# Patient Record
Sex: Male | Born: 1955 | Race: Black or African American | Hispanic: No | Marital: Single | Smoking: Never smoker
Health system: Southern US, Community
[De-identification: ages and names within clinical notes are randomized; demographics above are authoritative.]

## PROBLEM LIST (undated history)

## (undated) DIAGNOSIS — F1010 Alcohol abuse, uncomplicated: Secondary | ICD-10-CM

## (undated) DIAGNOSIS — I5032 Chronic diastolic (congestive) heart failure: Secondary | ICD-10-CM

## (undated) DIAGNOSIS — I10 Essential (primary) hypertension: Secondary | ICD-10-CM

## (undated) DIAGNOSIS — I4892 Unspecified atrial flutter: Secondary | ICD-10-CM

## (undated) HISTORY — DX: Unspecified atrial flutter: I48.92

## (undated) HISTORY — PX: NO PAST SURGERIES: SHX2092

## (undated) HISTORY — DX: Chronic diastolic (congestive) heart failure: I50.32

---

## 2008-12-30 ENCOUNTER — Inpatient Hospital Stay: Payer: Self-pay | Admitting: Internal Medicine

## 2010-10-22 ENCOUNTER — Emergency Department: Payer: Self-pay | Admitting: Emergency Medicine

## 2013-08-17 ENCOUNTER — Emergency Department: Payer: Self-pay | Admitting: Internal Medicine

## 2013-08-25 ENCOUNTER — Emergency Department: Payer: Self-pay | Admitting: Emergency Medicine

## 2018-06-20 ENCOUNTER — Encounter: Payer: Self-pay | Admitting: Emergency Medicine

## 2018-06-20 ENCOUNTER — Emergency Department: Payer: Self-pay

## 2018-06-20 ENCOUNTER — Other Ambulatory Visit: Payer: Self-pay

## 2018-06-20 ENCOUNTER — Inpatient Hospital Stay
Admission: EM | Admit: 2018-06-20 | Discharge: 2018-06-22 | DRG: 309 | Disposition: A | Discharge status: Home / Self Care | Payer: Self-pay | Attending: Internal Medicine | Admitting: Internal Medicine

## 2018-06-20 DIAGNOSIS — R000 Tachycardia, unspecified: Secondary | ICD-10-CM

## 2018-06-20 DIAGNOSIS — I4892 Unspecified atrial flutter: Principal | ICD-10-CM | POA: Diagnosis present

## 2018-06-20 DIAGNOSIS — I471 Supraventricular tachycardia: Secondary | ICD-10-CM | POA: Diagnosis present

## 2018-06-20 DIAGNOSIS — G629 Polyneuropathy, unspecified: Secondary | ICD-10-CM | POA: Diagnosis present

## 2018-06-20 DIAGNOSIS — F17200 Nicotine dependence, unspecified, uncomplicated: Secondary | ICD-10-CM | POA: Diagnosis present

## 2018-06-20 DIAGNOSIS — N179 Acute kidney failure, unspecified: Secondary | ICD-10-CM | POA: Diagnosis present

## 2018-06-20 DIAGNOSIS — Z23 Encounter for immunization: Secondary | ICD-10-CM

## 2018-06-20 DIAGNOSIS — R200 Anesthesia of skin: Secondary | ICD-10-CM

## 2018-06-20 DIAGNOSIS — R780 Finding of alcohol in blood: Secondary | ICD-10-CM

## 2018-06-20 DIAGNOSIS — N189 Chronic kidney disease, unspecified: Secondary | ICD-10-CM | POA: Diagnosis present

## 2018-06-20 DIAGNOSIS — I480 Paroxysmal atrial fibrillation: Secondary | ICD-10-CM | POA: Diagnosis present

## 2018-06-20 DIAGNOSIS — F10229 Alcohol dependence with intoxication, unspecified: Secondary | ICD-10-CM | POA: Diagnosis present

## 2018-06-20 DIAGNOSIS — F10239 Alcohol dependence with withdrawal, unspecified: Secondary | ICD-10-CM | POA: Diagnosis present

## 2018-06-20 DIAGNOSIS — I129 Hypertensive chronic kidney disease with stage 1 through stage 4 chronic kidney disease, or unspecified chronic kidney disease: Secondary | ICD-10-CM | POA: Diagnosis present

## 2018-06-20 DIAGNOSIS — I484 Atypical atrial flutter: Secondary | ICD-10-CM

## 2018-06-20 DIAGNOSIS — F1910 Other psychoactive substance abuse, uncomplicated: Secondary | ICD-10-CM

## 2018-06-20 DIAGNOSIS — F10231 Alcohol dependence with withdrawal delirium: Secondary | ICD-10-CM

## 2018-06-20 HISTORY — DX: Essential (primary) hypertension: I10

## 2018-06-20 LAB — GLUCOSE, CAPILLARY: Glucose-Capillary: 98 mg/dL (ref 70–99)

## 2018-06-20 LAB — CBC WITH DIFFERENTIAL/PLATELET
Abs Immature Granulocytes: 0.01 10*3/uL (ref 0.00–0.07)
Basophils Absolute: 0.1 10*3/uL (ref 0.0–0.1)
Basophils Relative: 1 %
Eosinophils Absolute: 0 10*3/uL (ref 0.0–0.5)
Eosinophils Relative: 1 %
HCT: 41.3 % (ref 39.0–52.0)
Hemoglobin: 14.2 g/dL (ref 13.0–17.0)
Immature Granulocytes: 0 %
Lymphocytes Relative: 34 %
Lymphs Abs: 2.1 10*3/uL (ref 0.7–4.0)
MCH: 28.9 pg (ref 26.0–34.0)
MCHC: 34.4 g/dL (ref 30.0–36.0)
MCV: 83.9 fL (ref 80.0–100.0)
Monocytes Absolute: 0.4 10*3/uL (ref 0.1–1.0)
Monocytes Relative: 7 %
Neutro Abs: 3.6 10*3/uL (ref 1.7–7.7)
Neutrophils Relative %: 57 %
Platelets: 376 10*3/uL (ref 150–400)
RBC: 4.92 MIL/uL (ref 4.22–5.81)
RDW: 13.2 % (ref 11.5–15.5)
WBC: 6.1 10*3/uL (ref 4.0–10.5)
nRBC: 0 % (ref 0.0–0.2)

## 2018-06-20 LAB — MRSA PCR SCREENING: MRSA by PCR: NEGATIVE

## 2018-06-20 LAB — URINE DRUG SCREEN, QUALITATIVE (ARMC ONLY)
Amphetamines, Ur Screen: NOT DETECTED
Barbiturates, Ur Screen: NOT DETECTED
Benzodiazepine, Ur Scrn: NOT DETECTED
Cannabinoid 50 Ng, Ur ~~LOC~~: NOT DETECTED
Cocaine Metabolite,Ur ~~LOC~~: NOT DETECTED
MDMA (Ecstasy)Ur Screen: NOT DETECTED
Methadone Scn, Ur: NOT DETECTED
Opiate, Ur Screen: NOT DETECTED
Phencyclidine (PCP) Ur S: NOT DETECTED
Tricyclic, Ur Screen: NOT DETECTED

## 2018-06-20 LAB — URINALYSIS, COMPLETE (UACMP) WITH MICROSCOPIC
Bacteria, UA: NONE SEEN
Bilirubin Urine: NEGATIVE
Glucose, UA: NEGATIVE mg/dL
Ketones, ur: NEGATIVE mg/dL
Leukocytes,Ua: NEGATIVE
Nitrite: NEGATIVE
Protein, ur: 30 mg/dL — AB
Specific Gravity, Urine: 1.003 — ABNORMAL LOW (ref 1.005–1.030)
pH: 6 (ref 5.0–8.0)

## 2018-06-20 LAB — COMPREHENSIVE METABOLIC PANEL
ALT: 27 U/L (ref 0–44)
AST: 51 U/L — ABNORMAL HIGH (ref 15–41)
Albumin: 4.5 g/dL (ref 3.5–5.0)
Alkaline Phosphatase: 78 U/L (ref 38–126)
Anion gap: 18 — ABNORMAL HIGH (ref 5–15)
BUN: 12 mg/dL (ref 8–23)
CO2: 22 mmol/L (ref 22–32)
Calcium: 8.6 mg/dL — ABNORMAL LOW (ref 8.9–10.3)
Chloride: 95 mmol/L — ABNORMAL LOW (ref 98–111)
Creatinine, Ser: 1.08 mg/dL (ref 0.61–1.24)
GFR calc Af Amer: >60 mL/min (ref >60)
GFR calc non Af Amer: >60 mL/min (ref >60)
Glucose, Bld: 125 mg/dL — ABNORMAL HIGH (ref 70–99)
Potassium: 4.2 mmol/L (ref 3.5–5.1)
Sodium: 135 mmol/L (ref 135–145)
Total Bilirubin: 0.7 mg/dL (ref 0.3–1.2)
Total Protein: 8.1 g/dL (ref 6.5–8.1)

## 2018-06-20 LAB — PROTIME-INR
INR: 1 (ref 0.8–1.2)
Prothrombin Time: 13 seconds (ref 11.4–15.2)

## 2018-06-20 LAB — MAGNESIUM: Magnesium: 2 mg/dL (ref 1.7–2.4)

## 2018-06-20 LAB — BRAIN NATRIURETIC PEPTIDE: B Natriuretic Peptide: 414 pg/mL — ABNORMAL HIGH (ref 0.0–100.0)

## 2018-06-20 LAB — TROPONIN I: Troponin I: <0.03 ng/mL (ref <0.03)

## 2018-06-20 LAB — ETHANOL: Alcohol, Ethyl (B): 376 mg/dL (ref <10)

## 2018-06-20 MED ORDER — ACETAMINOPHEN 325 MG PO TABS: 650.0000 mg | ORAL_TABLET | Freq: Four times a day (QID) | ORAL | Status: DC | PRN

## 2018-06-20 MED ORDER — LORAZEPAM 1 MG PO TABS: 1.0000 mg | ORAL_TABLET | Freq: Four times a day (QID) | ORAL | Status: DC | PRN

## 2018-06-20 MED ORDER — METOPROLOL TARTRATE 5 MG/5ML IV SOLN
2.5000 mg | Freq: Four times a day (QID) | INTRAVENOUS | Status: DC | PRN
  Administered 2018-06-20: 5 mg via INTRAVENOUS
  Filled 2018-06-20: qty 5

## 2018-06-20 MED ORDER — ENOXAPARIN SODIUM 40 MG/0.4ML ~~LOC~~ SOLN
40.0000 mg | SUBCUTANEOUS | Status: DC
  Administered 2018-06-20: 40 mg via SUBCUTANEOUS
  Filled 2018-06-20: qty 0.4

## 2018-06-20 MED ORDER — MAGNESIUM SULFATE 2 GM/50ML IV SOLN: 2.0000 g | Freq: Once | INTRAVENOUS | Status: DC

## 2018-06-20 MED ORDER — SODIUM CHLORIDE 0.9 % IV BOLUS
1000.0000 mL | Freq: Once | INTRAVENOUS | Status: AC
  Administered 2018-06-20: 1000 mL via INTRAVENOUS

## 2018-06-20 MED ORDER — SODIUM CHLORIDE 0.9% FLUSH
3.0000 mL | Freq: Two times a day (BID) | INTRAVENOUS | Status: DC
  Administered 2018-06-20 – 2018-06-22 (×3): 3 mL via INTRAVENOUS

## 2018-06-20 MED ORDER — ONDANSETRON HCL 4 MG PO TABS: 4.0000 mg | ORAL_TABLET | Freq: Four times a day (QID) | ORAL | Status: DC | PRN

## 2018-06-20 MED ORDER — POLYETHYLENE GLYCOL 3350 17 G PO PACK: 17.0000 g | PACK | Freq: Every day | ORAL | Status: DC | PRN

## 2018-06-20 MED ORDER — DILTIAZEM HCL 25 MG/5ML IV SOLN
15.0000 mg | Freq: Once | INTRAVENOUS | Status: AC
  Administered 2018-06-20: 15 mg via INTRAVENOUS

## 2018-06-20 MED ORDER — ACETAMINOPHEN 650 MG RE SUPP: 650.0000 mg | Freq: Four times a day (QID) | RECTAL | Status: DC | PRN

## 2018-06-20 MED ORDER — PNEUMOCOCCAL VAC POLYVALENT 25 MCG/0.5ML IJ INJ: 0.5000 mL | INJECTION | INTRAMUSCULAR | Status: DC

## 2018-06-20 MED ORDER — ASPIRIN EC 325 MG PO TBEC
325.0000 mg | DELAYED_RELEASE_TABLET | Freq: Every day | ORAL | Status: DC
  Administered 2018-06-21: 325 mg via ORAL
  Filled 2018-06-20: qty 1

## 2018-06-20 MED ORDER — FOLIC ACID 1 MG PO TABS
1.0000 mg | ORAL_TABLET | Freq: Every day | ORAL | Status: DC
  Administered 2018-06-21 – 2018-06-22 (×2): 1 mg via ORAL
  Filled 2018-06-20 (×2): qty 1

## 2018-06-20 MED ORDER — DILTIAZEM HCL 100 MG IV SOLR
5.0000 mg/h | INTRAVENOUS | Status: DC
  Administered 2018-06-20: 5 mg/h via INTRAVENOUS
  Administered 2018-06-21: 12.5 mg/h via INTRAVENOUS
  Administered 2018-06-21: 5 mg/h via INTRAVENOUS
  Administered 2018-06-21: 01:00:00 15 mg/h via INTRAVENOUS
  Filled 2018-06-20 (×3): qty 100

## 2018-06-20 MED ORDER — THIAMINE HCL 100 MG/ML IJ SOLN
100.0000 mg | Freq: Every day | INTRAMUSCULAR | Status: DC
  Filled 2018-06-20 (×2): qty 2

## 2018-06-20 MED ORDER — ADULT MULTIVITAMIN W/MINERALS CH
1.0000 | ORAL_TABLET | Freq: Every day | ORAL | Status: DC
  Administered 2018-06-21 – 2018-06-22 (×2): 1 via ORAL
  Filled 2018-06-20 (×2): qty 1

## 2018-06-20 MED ORDER — INFLUENZA VAC SPLIT QUAD 0.5 ML IM SUSY: 0.5000 mL | PREFILLED_SYRINGE | INTRAMUSCULAR | Status: DC

## 2018-06-20 MED ORDER — METOPROLOL TARTRATE 50 MG PO TABS
50.0000 mg | ORAL_TABLET | Freq: Two times a day (BID) | ORAL | Status: DC
  Administered 2018-06-21: 50 mg via ORAL
  Filled 2018-06-20: qty 1

## 2018-06-20 MED ORDER — LORAZEPAM 2 MG/ML IJ SOLN
1.0000 mg | Freq: Once | INTRAMUSCULAR | Status: AC
  Administered 2018-06-20: 1 mg via INTRAVENOUS
  Filled 2018-06-20: qty 1

## 2018-06-20 MED ORDER — ONDANSETRON HCL 4 MG/2ML IJ SOLN
4.0000 mg | Freq: Four times a day (QID) | INTRAMUSCULAR | Status: DC | PRN
  Administered 2018-06-21: 4 mg via INTRAVENOUS
  Filled 2018-06-20: qty 2

## 2018-06-20 MED ORDER — VITAMIN B-1 100 MG PO TABS
100.0000 mg | ORAL_TABLET | Freq: Every day | ORAL | Status: DC
  Administered 2018-06-21 – 2018-06-22 (×2): 100 mg via ORAL
  Filled 2018-06-20 (×2): qty 1

## 2018-06-20 MED ORDER — LACTATED RINGERS IV SOLN
INTRAVENOUS | Status: AC
  Administered 2018-06-20: 23:00:00 via INTRAVENOUS
  Administered 2018-06-21: 900 mL via INTRAVENOUS

## 2018-06-20 MED ORDER — DILTIAZEM HCL 100 MG IV SOLR: 15.0000 mg | Freq: Once | INTRAVENOUS | Status: DC

## 2018-06-20 MED ORDER — LORAZEPAM 2 MG/ML IJ SOLN
1.0000 mg | Freq: Four times a day (QID) | INTRAMUSCULAR | Status: DC | PRN
  Administered 2018-06-21: 1 mg via INTRAVENOUS
  Filled 2018-06-20 (×2): qty 1

## 2018-06-20 MED ORDER — ALBUTEROL SULFATE (2.5 MG/3ML) 0.083% IN NEBU: 2.5000 mg | INHALATION_SOLUTION | RESPIRATORY_TRACT | Status: DC | PRN

## 2018-06-20 NOTE — ED Notes (Signed)
ED TO INPATIENT HANDOFF REPORT  ED Nurse Name and Phone #: Jae Dire [phone]  S Name/Age/Gender Jacob Waters 63 y.o. male Room/Bed: ED01A/ED01A  Code Status   Code Status: Full Code  Home/SNF/Other Home Patient oriented to: self, place, time and situation Is this baseline? Yes   Triage Complete: Triage complete  Chief Complaint left side numbness  Triage Note Patient arrives via EMS for numbness on L side since Saturday. Patient also reports he has been drinking; EMS reports HR of 168 and hypertensive. Patient says he drank "5-6 beers and a pint of wine today". Denies illicit drug use   Allergies Not on File  Level of Care/Admitting Diagnosis ED Disposition    ED Disposition Condition Comment   Admit  Hospital Area: [facility] [[id number]]  Level of Care: Stepdown [14]  Diagnosis: Atrial flutter (HCC) [427.32.ICD-9-CM]  Admitting Physician: Milagros Loll [[id number]]  Attending Physician: Milagros Loll [[id number]]  Estimated length of stay: past midnight tomorrow  Certification:: I certify this patient will need inpatient services for at least 2 midnights  PT Class (Do Not Modify): Inpatient [101]  PT Acc Code (Do Not Modify): Private [1]       B Medical/Surgery History Past Medical History:  Diagnosis Date  . Hypertension    History reviewed. No pertinent surgical history.   A IV Location/Drains/Wounds Patient Lines/Drains/Airways Status   Active Line/Drains/Airways    Name:   Placement date:   Placement time:   Site:   Days:   Peripheral IV 06/20/18 Right Forearm   06/20/18    1910    Forearm   less than 1   Peripheral IV 06/20/18 Right Wrist   06/20/18    1911    Wrist   less than 1          Intake/Output Last 24 hours No intake or output data in the 24 hours ending 06/20/18 2036  Labs/Imaging Results for orders placed or performed during the hospital encounter of 06/20/18 (from the past 48 hour(s))  CBC with Differential     Status:  None   Collection Time: 06/20/18  6:25 PM  Result Value Ref Range   WBC 6.1 4.0 - 10.5 K/uL   RBC 4.92 4.22 - 5.81 MIL/uL   Hemoglobin 14.2 13.0 - 17.0 g/dL   HCT 44.9 20.1 - 00.7 %   MCV 83.9 80.0 - 100.0 fL   MCH 28.9 26.0 - 34.0 pg   MCHC 34.4 30.0 - 36.0 g/dL   RDW 12.1 97.5 - 88.3 %   Platelets 376 150 - 400 K/uL   nRBC 0.0 0.0 - 0.2 %   Neutrophils Relative % 57 %   Neutro Abs 3.6 1.7 - 7.7 K/uL   Lymphocytes Relative 34 %   Lymphs Abs 2.1 0.7 - 4.0 K/uL   Monocytes Relative 7 %   Monocytes Absolute 0.4 0.1 - 1.0 K/uL   Eosinophils Relative 1 %   Eosinophils Absolute 0.0 0.0 - 0.5 K/uL   Basophils Relative 1 %   Basophils Absolute 0.1 0.0 - 0.1 K/uL   Immature Granulocytes 0 %   Abs Immature Granulocytes 0.01 0.00 - 0.07 K/uL    Comment: Performed at [facility] [address] [place], [place] [address]  Protime-INR     Status: None   Collection Time: 06/20/18  6:25 PM  Result Value Ref Range   Prothrombin Time 13.0 11.4 - 15.2 seconds   INR 1.0 0.8 - 1.2    Comment: (NOTE)  INR goal varies based on device and disease states. Performed at [facility], [address] [place], [place] [address]   Troponin I - Once     Status: None   Collection Time: 06/20/18  6:25 PM  Result Value Ref Range   Troponin I <0.03 <0.03 ng/mL    Comment: Performed at [facility], [address] [place], [place] [address]  Brain natriuretic peptide     Status: Abnormal   Collection Time: 06/20/18  6:25 PM  Result Value Ref Range   B Natriuretic Peptide 414.0 (H) 0.0 - 100.0 pg/mL    Comment: Performed at [facility], [address] [place], [place] [address]  Comprehensive metabolic panel     Status: Abnormal   Collection Time: 06/20/18  6:25 PM  Result Value Ref Range   Sodium 135 135 - 145 mmol/L   Potassium 4.2 3.5 - 5.1 mmol/L   Chloride 95 (L) 98 - 111 mmol/L   CO2 22 22 - 32 mmol/L   Glucose, Bld 125 (H) 70 - 99 mg/dL   BUN 12 8  - 23 mg/dL   Creatinine, Ser 9.14 0.61 - 1.24 mg/dL   Calcium 8.6 (L) 8.9 - 10.3 mg/dL   Total Protein 8.1 6.5 - 8.1 g/dL   Albumin 4.5 3.5 - 5.0 g/dL   AST 51 (H) 15 - 41 U/L   ALT 27 0 - 44 U/L   Alkaline Phosphatase 78 38 - 126 U/L   Total Bilirubin 0.7 0.3 - 1.2 mg/dL   GFR calc non Af Amer >60 >60 mL/min   GFR calc Af Amer >60 >60 mL/min   Anion gap 18 (H) 5 - 15    Comment: Performed at [facility], [address] [place], [place] [address]  Ethanol     Status: Abnormal   Collection Time: 06/20/18  6:25 PM  Result Value Ref Range   Alcohol, Ethyl (B) 376 (HH) <10 mg/dL    Comment: CRITICAL RESULT CALLED TO, READ BACK BY AND VERIFIED WITH KATE BUCKNAM  06/20/18 AKT (NOTE) Lowest detectable limit for serum alcohol is 10 mg/dL. For medical purposes only. Performed at [facility], [address] [place], [place] [address]   Magnesium     Status: None   Collection Time: 06/20/18  6:25 PM  Result Value Ref Range   Magnesium 2.0 1.7 - 2.4 mg/dL    Comment: Performed at [facility], [address] [place], [place] [address]  Urinalysis, Complete w Microscopic     Status: Abnormal   Collection Time: 06/20/18  7:23 PM  Result Value Ref Range   Color, Urine STRAW (A) YELLOW   APPearance CLEAR (A) CLEAR   Specific Gravity, Urine 1.003 (L) 1.005 - 1.030   pH 6.0 5.0 - 8.0   Glucose, UA NEGATIVE NEGATIVE mg/dL   Hgb urine dipstick SMALL (A) NEGATIVE   Bilirubin Urine NEGATIVE NEGATIVE   Ketones, ur NEGATIVE NEGATIVE mg/dL   Protein, ur 30 (A) NEGATIVE mg/dL   Nitrite NEGATIVE NEGATIVE   Leukocytes,Ua NEGATIVE NEGATIVE   WBC, UA 0-5 0 - 5 WBC/hpf   Bacteria, UA NONE SEEN NONE SEEN   Squamous Epithelial / LPF 0-5 0 - 5    Comment: Performed at [facility], [address] [place], [place] [address]  Urine Drug Screen, Qualitative     Status: None   Collection Time: 06/20/18  7:23 PM  Result Value Ref Range   Tricyclic, Ur  Screen NONE DETECTED NONE DETECTED   Amphetamines, Ur Screen NONE DETECTED NONE DETECTED   MDMA (Ecstasy)Ur Screen NONE DETECTED NONE DETECTED   Cocaine Metabolite,Ur [place] NONE DETECTED NONE DETECTED   Opiate, Ur Screen NONE DETECTED NONE DETECTED   Phencyclidine (PCP) Ur S NONE DETECTED NONE DETECTED   Cannabinoid 50 Ng, Ur [place] NONE DETECTED NONE DETECTED   Barbiturates, Ur Screen NONE DETECTED NONE DETECTED   Benzodiazepine, Ur Scrn NONE DETECTED NONE DETECTED   Methadone Scn, Ur NONE DETECTED NONE DETECTED    Comment: (NOTE) Tricyclics + metabolites, urine    Cutoff 1000 ng/mL Amphetamines + metabolites, urine  Cutoff 1000 ng/mL MDMA (Ecstasy), urine              Cutoff 500 ng/mL Cocaine Metabolite, urine          Cutoff 300 ng/mL Opiate + metabolites, urine        Cutoff 300 ng/mL Phencyclidine (PCP), urine         Cutoff 25 ng/mL Cannabinoid, urine                 Cutoff 50 ng/mL Barbiturates + metabolites, urine  Cutoff 200 ng/mL Benzodiazepine, urine              Cutoff 200 ng/mL Methadone, urine                   Cutoff 300 ng/mL The urine drug screen provides only a preliminary, unconfirmed analytical test result and should not be used for non-medical purposes. Clinical consideration and professional judgment should be applied to any positive drug screen result due to possible interfering substances. A more specific alternate chemical method must be used in order to obtain a confirmed analytical result. Gas chromatography / mass spectrometry (GC/MS) is the preferred confirmat ory method. Performed at [facility], [address] [place], [place] [address]    Ct Head Wo Contrast  Result Date: 06/20/2018 CLINICAL DATA:  63 year old male with a history of numbness EXAM: CT HEAD WITHOUT CONTRAST TECHNIQUE: Contiguous axial images were obtained from the base of the skull through the vertex without intravenous contrast. COMPARISON:  None. FINDINGS: Brain: No acute  intracranial hemorrhage. No midline shift or mass effect. Brain volume loss without expansion of the ventricles. Gray-white differentiation maintained. Patchy hypodensity in the bilateral periventricular white matter. Focal hypodensity in the right basal ganglia. Vascular: Intracranial calcifications Skull: No acute skull fracture.  No aggressive bony lesions. Sinuses/Orbits: No acute finding. Other: None. IMPRESSION: Negative for acute intracranial abnormality. Brain volume loss, with evidence of chronic microvascular ischemic disease and associated intracranial atherosclerosis. Electronically Signed   By: Gilmer Mor D.O.   On: 06/20/2018 19:24   Dg Chest Port 1 View  Result Date: 06/20/2018 CLINICAL DATA:  Left-sided numbness EXAM: PORTABLE CHEST 1 VIEW COMPARISON:  None. FINDINGS: The heart size and mediastinal contours are within normal limits. Both lungs are clear. The visualized skeletal structures are unremarkable. IMPRESSION: No active disease. Electronically Signed   By: Elige Ko   On: 06/20/2018 19:24    Pending Labs Unresulted Labs (From admission, onward)    Start     Ordered   06/27/18 0500  Creatinine, serum  (enoxaparin (LOVENOX)    CrCl >/= 30 ml/min)  Weekly,   STAT    Comments:  while on enoxaparin therapy    06/20/18 2003   06/21/18 0500  Comprehensive metabolic panel  Tomorrow morning,   STAT     06/20/18 2003  06/21/18 0500  CBC  Tomorrow morning,   STAT     06/20/18 2003   06/20/18 2003  HIV antibody (Routine Testing)  Add-on,   AD     06/20/18 2003          Vitals/Pain Today's Vitals   06/20/18 1900 06/20/18 1912 06/20/18 1930 06/20/18 2000  BP: (!) 167/103  (!) 154/97 137/84  Pulse: (!) 162  (!) 160 (!) 157  Resp: 17  (!) 22 15  Temp:  98.1 F (36.7 C)    TempSrc:  Oral    SpO2: 94%  98% 93%  Weight:      Height:      PainSc:        Isolation Precautions No active isolations  Medications Medications  diltiazem (CARDIZEM) 100 mg in dextrose  5 % 100 mL (1 mg/mL) infusion (15 mg/hr Intravenous Rate/Dose Change 06/20/18 1950)  metoprolol tartrate (LOPRESSOR) tablet 50 mg (has no administration in time range)  enoxaparin (LOVENOX) injection 40 mg (has no administration in time range)  sodium chloride flush (NS) 0.9 % injection 3 mL (has no administration in time range)  acetaminophen (TYLENOL) tablet 650 mg (has no administration in time range)    Or  acetaminophen (TYLENOL) suppository 650 mg (has no administration in time range)  polyethylene glycol (MIRALAX / GLYCOLAX) packet 17 g (has no administration in time range)  ondansetron (ZOFRAN) tablet 4 mg (has no administration in time range)    Or  ondansetron (ZOFRAN) injection 4 mg (has no administration in time range)  albuterol (PROVENTIL) (2.5 MG/3ML) 0.083% nebulizer solution 2.5 mg (has no administration in time range)  lactated ringers infusion (has no administration in time range)  LORazepam (ATIVAN) tablet 1 mg (has no administration in time range)    Or  LORazepam (ATIVAN) injection 1 mg (has no administration in time range)  thiamine (VITAMIN B-1) tablet 100 mg (has no administration in time range)    Or  thiamine (B-1) injection 100 mg (has no administration in time range)  folic acid (FOLVITE) tablet 1 mg (has no administration in time range)  multivitamin with minerals tablet 1 tablet (has no administration in time range)  aspirin tablet 325 mg (has no administration in time range)  sodium chloride 0.9 % bolus 1,000 mL (0 mLs Intravenous Stopped 06/20/18 1943)  diltiazem (CARDIZEM) injection 15 mg (15 mg Intravenous Given 06/20/18 2019)  LORazepam (ATIVAN) injection 1 mg (1 mg Intravenous Given 06/20/18 2019)    Mobility walks with person assist Moderate fall risk   Focused Assessments Cardiac Assessment Handoff:  Cardiac Rhythm: Supraventricular tachycardia Lab Results  Component Value Date   TROPONINI <0.03 06/20/2018   No results found for: DDIMER Does  the Patient currently have chest pain? No     R Recommendations: See Admitting Provider Note  Report given to:   Additional Notes:

## 2018-06-20 NOTE — Progress Notes (Signed)
eLink Physician-Brief Progress Note Patient Name: Brylee Walquist DOB: April 25, 1955 MRN: [id number]   Date of Service  06/20/2018  HPI/Events of Note  12 male with hx of HTN, alcohol abuse with CC: alcohol intoxication with level 376, AMS with CTH neg. In a flutter RVR. Received lopressor and now on cardizem gtt. UDS neg. Labs co2 222. LFT normal. Troponin x 1 neg. UA neg. Data: Reviewed.  Camera: He is talking to Charity fundraiser. HR 154, flutter. 164/76, sats 96% on room air. Looks comfortable.     eICU Interventions  - discussed with NP - received fluids/CIWA protocol - 2d echo, cardiology consult in am - aspiration and seizure precautions - follow troponin.  - follow MRI for left arm numbness. Consider Neurology help if not better.  - ETOH withdrawal protocol. Prn ativan.  - on VTE sq lovenox.      Intervention Category Major Interventions: Arrhythmia - evaluation and management;Other:;Change in mental status - evaluation and management Evaluation Type: New Patient Evaluation  Ranee Gosselin 06/20/2018, 8:36 PM

## 2018-06-20 NOTE — H&P (Signed)
[facility] [place] [facility]   PATIENT NAME: Jacob Waters    MR#:  [id number]  DATE OF BIRTH:  November 30, 1955  DATE OF ADMISSION:  06/20/2018  PRIMARY CARE PHYSICIAN: Patient, No Pcp Per   REQUESTING/REFERRING PHYSICIAN: Dr. Alphonzo Lemmings  CHIEF COMPLAINT:   Chief Complaint  Patient presents with  . Numbness    HISTORY OF PRESENT ILLNESS:  Jacob Waters  is a 63 y.o. male with a known history of alcohol abuse, hypertension not on any medications at home presents to the emergency room complaining of some left arm and chest numbness.  Patient is poor historian.  His alcohol level is 340.  He is unable to tell me if he feels heaviness of his left chest and left arm or numbness.  No weakness.  He had a CT scan of the head which was normal.  Chest x-ray looks clear.  Patient was found to have atrial flutter with heart rate in the 160s.  Patient is being admitted. Patient is extremely anxious also tearful at times.  His urine drug screen is negative.  No prior history of MI or CVA.  PAST MEDICAL HISTORY:   Past Medical History:  Diagnosis Date  . Hypertension     PAST SURGICAL HISTORY:  History reviewed. No pertinent surgical history.  SOCIAL HISTORY:   Social History   Tobacco Use  . Smoking status: Current Every Day Smoker  . Smokeless tobacco: Never Used  Substance Use Topics  . Alcohol use: Yes    FAMILY HISTORY:   Patient tells me his mom and dad had no medical problems.  Unreliable due to being intoxicated DRUG ALLERGIES:  Not on File  REVIEW OF SYSTEMS:   Review of Systems  Constitutional: Positive for malaise/fatigue. Negative for chills and fever.  HENT: Negative for sore throat.   Eyes: Negative for blurred vision, double vision and pain.  Respiratory: Negative for cough, hemoptysis, shortness of breath and wheezing.   Cardiovascular: Negative for chest pain, palpitations, orthopnea and leg swelling.  Gastrointestinal: Negative for abdominal  pain, constipation, diarrhea, heartburn, nausea and vomiting.  Genitourinary: Negative for dysuria and hematuria.  Musculoskeletal: Negative for back pain and joint pain.  Skin: Negative for rash.  Neurological: Positive for sensory change. Negative for speech change, focal weakness and headaches.  Endo/Heme/Allergies: Does not bruise/bleed easily.  Psychiatric/Behavioral: Negative for depression. The patient is not nervous/anxious.     MEDICATIONS AT HOME:   Prior to Admission medications   Not on File     VITAL SIGNS:  Blood pressure (!) 154/97, pulse (!) 160, temperature 98.1 F (36.7 C), temperature source Oral, resp. rate (!) 22, height 5\' 10"  (1.778 m), weight 77.1 kg, SpO2 98 %.  PHYSICAL EXAMINATION:  Physical Exam  GENERAL:  63 y.o.-year-old patient lying in the bed with no acute distress.  EYES: Pupils equal, round, reactive to light and accommodation. No scleral icterus. Extraocular muscles intact.  HEENT: Head atraumatic, normocephalic. Oropharynx and nasopharynx clear. No oropharyngeal erythema, moist oral mucosa  NECK:  Supple, no jugular venous distention. No thyroid enlargement, no tenderness.  LUNGS: Normal breath sounds bilaterally, no wheezing, rales, rhonchi. No use of accessory muscles of respiration.  CARDIOVASCULAR: S1, S2 .  Tachycardia ABDOMEN: Soft, nontender, nondistended. Bowel sounds present. No organomegaly or mass.  EXTREMITIES: No pedal edema, cyanosis, or clubbing. + 2 pedal & radial pulses b/l.   NEUROLOGIC: Cranial nerves II through XII are intact. No focal Motor or sensory deficits appreciated b/l PSYCHIATRIC:  The patient is alert and oriented x 3.  Anxious SKIN: No obvious rash, lesion, or ulcer.   LABORATORY PANEL:   CBC Recent Labs  Lab 06/20/18 1825  WBC 6.1  HGB 14.2  HCT 41.3  PLT 376   ------------------------------------------------------------------------------------------------------------------  Chemistries  Recent Labs   Lab 06/20/18 1825  NA 135  K 4.2  CL 95*  CO2 22  GLUCOSE 125*  BUN 12  CREATININE 1.08  CALCIUM 8.6*  MG 2.0  AST 51*  ALT 27  ALKPHOS 78  BILITOT 0.7   ------------------------------------------------------------------------------------------------------------------  Cardiac Enzymes Recent Labs  Lab 06/20/18 1825  TROPONINI <0.03   ------------------------------------------------------------------------------------------------------------------  RADIOLOGY:  Ct Head Wo Contrast  Result Date: 06/20/2018 CLINICAL DATA:  63 year old male with a history of numbness EXAM: CT HEAD WITHOUT CONTRAST TECHNIQUE: Contiguous axial images were obtained from the base of the skull through the vertex without intravenous contrast. COMPARISON:  None. FINDINGS: Brain: No acute intracranial hemorrhage. No midline shift or mass effect. Brain volume loss without expansion of the ventricles. Gray-white differentiation maintained. Patchy hypodensity in the bilateral periventricular white matter. Focal hypodensity in the right basal ganglia. Vascular: Intracranial calcifications Skull: No acute skull fracture.  No aggressive bony lesions. Sinuses/Orbits: No acute finding. Other: None. IMPRESSION: Negative for acute intracranial abnormality. Brain volume loss, with evidence of chronic microvascular ischemic disease and associated intracranial atherosclerosis. Electronically Signed   By: Gilmer Mor D.O.   On: 06/20/2018 19:24   Dg Chest Port 1 View  Result Date: 06/20/2018 CLINICAL DATA:  Left-sided numbness EXAM: PORTABLE CHEST 1 VIEW COMPARISON:  None. FINDINGS: The heart size and mediastinal contours are within normal limits. Both lungs are clear. The visualized skeletal structures are unremarkable. IMPRESSION: No active disease. Electronically Signed   By: Elige Ko   On: 06/20/2018 19:24     IMPRESSION AND PLAN:   * Atrial flutter, new onset with rapid ventricular rate in the 160s.  I had  Cardizem 15 mg pushed.  Started on Cardizem drip at 15 mg/h.  Heart rate continued to be elevated in the 150s.  Patient also started on metoprolol 50 mg oral twice daily.  Admit to stepdown unit.  Critically ill.  Consulted [facility] cardiology.  Message sent to Dr. Jens Som.  Echocardiogram ordered.  Check TSH.  I have not ordered anticoagulation at this time till we can rule out stroke with an MRI of the brain.  He is on Lovenox for DVT prophylaxis.  We will start aspirin.  *Left arm and chest numbness versus heaviness.  His symptoms could be from the atrial flutter.  But with atrial flutter he is at increased risk of stroke.  We will get an MRI of the brain.  Will need further work-up if MRI shows a stroke.  At this time patient has been started on aspirin and neurochecks.  *Alcohol abuse.  High risk for withdrawals.  Patient already seems extremely anxious.  Ordered CIWA protocol.  *Hypertension.  Started on Cardizem drip and metoprolol.  Monitor.  *DVT prophylaxis with Lovenox  All the records are reviewed and case discussed with ED provider. Management plans discussed with the patient, family and they are in agreement.  CODE STATUS: Full code  TOTAL CRITICAL CARE TIME TAKING CARE OF THIS PATIENT: 80 minutes.   Orie Fisherman M.D on 06/20/2018 at 8:06 PM  Between 7am to 6pm - Pager - [phone]  After 6pm go to [email or web address] - password [email or web address]  [facility] [place] [facility]  Office  [phone]  CC: Primary care physician; Patient, No Pcp Per  Note: This dictation was prepared with Dragon dictation along with smaller phrase technology. Any transcriptional errors that result from this process are unintentional.

## 2018-06-20 NOTE — ED Triage Notes (Signed)
Patient arrives via EMS for numbness on L side since Saturday. Patient also reports he has been drinking; EMS reports HR of 168 and hypertensive. Patient says he drank "5-6 beers and a pint of wine today". Denies illicit drug use

## 2018-06-20 NOTE — ED Provider Notes (Addendum)
[facility], [facility] Emergency Department Provider Note  ____________________________________________   I have reviewed the triage vital signs and the nursing notes. Where available I have reviewed prior notes and, if possible and indicated, outside hospital notes.    HISTORY  Chief Complaint Numbness    HPI Jacob Waters is a 63 y.o. male  Resents today after admitting to drinking a considerable amount of alcohol today which he states he does every day, he states that since yesterday at an unknown time he is been having sensation of numbness but no weakness in his left upper and left lower extremities.  Is been going on on interruptedly since then.  He denies any fall or trauma.  No cocaine use he states.  No difficulty speaking or talking or walking.  He has not had this before.  Patient specifically denies any chest pain or sensation of palpitations.  However his heart rate is noted to be 160.  He states he is never had any cardiac dysrhythmia that he knows of.  Denies any other medical history, alleviating aggravating factors or prior treatment   No past medical history on file.  There are no active problems to display for this patient.     Prior to Admission medications   Not on File    Allergies Patient has no allergy information on record.  No family history on file.  Social History Social History   Tobacco Use  . Smoking status: Not on file  Substance Use Topics  . Alcohol use: Not on file  . Drug use: Not on file    Review of Systems Constitutional: No fever/chills Eyes: No visual changes. ENT: No sore throat. No stiff neck no neck pain Cardiovascular: Denies chest pain. Respiratory: Denies shortness of breath. Gastrointestinal:   no vomiting.  No diarrhea.  No constipation. Genitourinary: Negative for dysuria. Musculoskeletal: Negative lower extremity swelling Skin: Negative for rash. Neurological: Negative for severe headaches, focal  weakness or numbness.   ____________________________________________   PHYSICAL EXAM:  VITAL SIGNS: ED Triage Vitals [06/20/18 1833]  Enc Vitals Group     BP      Pulse      Resp      Temp      Temp src      SpO2 95 %     Weight      Height      Head Circumference      Peak Flow      Pain Score      Pain Loc      Pain Edu?      Excl. in GC?     Constitutional: Alert and oriented. Well appearing and in no acute distress.  Smells of alcohol Eyes: Conjunctivae are normal Head: Atraumatic HEENT: No congestion/rhinnorhea. Mucous membranes are moist.  Oropharynx non-erythematous Neck:   Nontender with no meningismus, no masses, no stridor Cardiovascular: Tachycardic, regular rhythm. Grossly normal heart sounds.  Good peripheral circulation. Respiratory: Normal respiratory effort.  No retractions. Lungs CTAB. Abdominal: Soft and nontender. No distention. No guarding no rebound Back:  There is no focal tenderness or step off.  there is no midline tenderness there are no lesions noted. there is no CVA tenderness Musculoskeletal: No lower extremity tenderness, no upper extremity tenderness. No joint effusions, no DVT signs strong distal pulses no edema Neurologic:  Normal speech and language.  Difficult to appreciate full neurologic exam given patient somewhat spotty compliance with the exam.  However, finger-to-nose is somewhat apparent on both  sides, he has good strength bilateral, upper and lower extremity there is no obvious sensory deficit that I can discern but again patient is somewhat distractible during exam.  Cranial nerves are grossly intact. Skin:  Skin is warm, dry and intact. No rash noted. Psychiatric: Mood and affect are normal. Speech and behavior are normal.  ____________________________________________   LABS (all labs ordered are listed, but only abnormal results are displayed)  Labs Reviewed  CBC WITH DIFFERENTIAL/PLATELET  PROTIME-INR  TROPONIN I  BRAIN  NATRIURETIC PEPTIDE  COMPREHENSIVE METABOLIC PANEL  ETHANOL  URINALYSIS, COMPLETE (UACMP) WITH MICROSCOPIC  URINE DRUG SCREEN, QUALITATIVE ([facility] ONLY)  MAGNESIUM  CBG MONITORING, ED    Pertinent labs  results that were available during my care of the patient were reviewed by me and considered in my medical decision making (see chart for details). ____________________________________________  EKG  I personally interpreted any EKGs ordered by me or triage There are complex tachycardia, regular rate 159 on 100 goes from 159 none 60, normal axis, no acute ischemic changes likely rate related changes noted laterally no old for comparison ____________________________________________  RADIOLOGY  Pertinent labs & imaging results that were available during my care of the patient were reviewed by me and considered in my medical decision making (see chart for details). If possible, patient and/or family made aware of any abnormal findings.  No results found. ____________________________________________    PROCEDURES  Procedure(s) performed: None  Procedures  Critical Care performed: None  ____________________________________________   INITIAL IMPRESSION / ASSESSMENT AND PLAN / ED COURSE  Pertinent labs & imaging results that were available during my care of the patient were reviewed by me and considered in my medical decision making (see chart for details).  Patient who drinks every day and states he has had a good amount of alcohol to drink today including beer and wine, presents today complaining of feeling them on his left side.  Exam is again very limited, but I do not detect any significant deficit.  He has been symptomatic he states since yesterday, making him not a candidate for TPA in any event.  Also I would suspect that there is probably some inconsistency in his history given his considerable alcohol consumption today and yesterday as he self-reports.  Nonetheless we will  get a CT scan to look for bleed than anything else given his history, and we will also have to address his heart rate.  Patient appears to my exam to be in a flutter most likely with a heart rate and narrow complex at 160 which is particularly variable.  It seems somewhat low for SVT I did try Valsalva heart rate went down to 159 and then stayed at 160 again.  I am a little reluctant to cardiovert him because of the fact that he has no idea how long he has been in this rhythm and his blood pressure is stable.  We will start with CT scan IV fluid, magnesium, blood work and we will reassess.Marland Kitchen  ----------------------------------------- 7:34 PM on 06/20/2018 -----------------------------------------  She is still asymptomatically tachycardic he is also intoxicated by his alcohol level, letter work and CT scan chest x-ray reassuring, we are starting him on a Cardizem drip to see if we can get him into a more reasonable rate.  Given his report of left-sided weakness which is certainly unreliable given his intoxication but is also most likely atrial flutter, narrow complex tachycardia which does not seem to vary in rate from 160, we will bring him  into the hospital.  ----------------------------------------- 9:17 PM on 06/20/2018 -----------------------------------------  Heart rate now 83, atrial flutter with a normal axis.  No acute ST elevation or depression,   ____________________________________________   FINAL CLINICAL IMPRESSION(S) / ED DIAGNOSES  Final diagnoses:  Rapid heart rate      This chart was dictated using voice recognition software.  Despite best efforts to proofread,  errors can occur which can change meaning.      Jeanmarie Plant, MD 06/20/18 Daneil Dan, MD 06/20/18 Elenore Paddy, MD 06/20/18 1956    Jeanmarie Plant, MD 06/20/18 2117

## 2018-06-20 NOTE — Consult Note (Signed)
Name: Jacob Waters MRN: [id number] DOB: [birth year]    ADMISSION DATE:  06/20/2018 CONSULTATION DATE: 06/20/2018  REFERRING MD : Dr. Elpidio Anis   CHIEF COMPLAINT: Left arm numbness   BRIEF PATIENT DESCRIPTION:  63 yo male with hx of HTN and alcohol abuse admitted with left arm and chest numbness, alcohol intoxication and atrial flutter with rvr requiring cardizem gtt   SIGNIFICANT EVENTS/STUDIES:  04/13-Pt admitted to stepdown unit   HISTORY OF PRESENT ILLNESS:   This is a 63 yo male with a PMH of HTN and ETOH abuse presented to [facility] ER on 04/13 with left arm and chest numbness.  In the ER CXR and CT Head negative. Lab results revealed BNP 414, troponin <0.03, UA negative, alcohol level 376, and urine drug screen negative.  EKG revealed atrial flutter hr 160's, this is new onset. Therefore, cardizem gtt initiated.  He was subsequently admitted to the stepdown unit by hospitalist team for additional workup and treatment.   PAST MEDICAL HISTORY :   has a past medical history of Hypertension.  has no past surgical history on file. Prior to Admission medications   Not on File   Not on File  FAMILY HISTORY:  family history is not on file. SOCIAL HISTORY:  reports that he has been smoking. He has never used smokeless tobacco. He reports current alcohol use. He reports that he does not use drugs.  REVIEW OF SYSTEMS: Positives in BOLD  Constitutional: Negative for fever, chills, weight loss, malaise/fatigue and diaphoresis.  HENT: Negative for hearing loss, ear pain, nosebleeds, congestion, sore throat, neck pain, tinnitus and ear discharge.   Eyes: Negative for blurred vision, double vision, photophobia, pain, discharge and redness.  Respiratory: Negative for cough, hemoptysis, sputum production, shortness of breath, wheezing and stridor.   Cardiovascular: Negative for chest pain, palpitations, orthopnea, claudication, leg swelling and PND.  Gastrointestinal: Negative for heartburn,  nausea, vomiting, abdominal pain, diarrhea, constipation, blood in stool and melena.  Genitourinary: Negative for dysuria, urgency, frequency, hematuria and flank pain.  Musculoskeletal: Negative for myalgias, back pain, joint pain and falls.  Skin: Negative for itching and rash.  Neurological: left arm and chest numbness, dizziness, tingling, tremors, sensory change, speech change, focal weakness, seizures, loss of consciousness, weakness and headaches.  Endo/Heme/Allergies: Negative for environmental allergies and polydipsia. Does not bruise/bleed easily.  SUBJECTIVE:  Pt states left arm and chest numbness improving   VITAL SIGNS: Temp:  [98.1 F (36.7 C)] 98.1 F (36.7 C) (04/13 1912) Pulse Rate:  [81-162] 157 (04/13 2000) Resp:  [15-22] 15 (04/13 2000) BP: (137-167)/(84-114) 137/84 (04/13 2000) SpO2:  [93 %-98 %] 93 % (04/13 2000) Weight:  [77.1 kg] 77.1 kg (04/13 1851)  PHYSICAL EXAMINATION: General: well developed, well nourished male NAD  Neuro: confused to placed, PERRLA, bilateral upper and lower motor strength 5/5, generalized sensation within normal limits  HEENT: supple, no JVD Cardiovascular: irregular irregular, no R/G  Lungs: clear throughout, even, non labored  Abdomen: +BS x4, soft, non tender, non distended  Musculoskeletal: normal bulk and tone, no edema  Skin: scattered scratches   Recent Labs  Lab 06/20/18 1825  NA 135  K 4.2  CL 95*  CO2 22  BUN 12  CREATININE 1.08  GLUCOSE 125*   Recent Labs  Lab 06/20/18 1825  HGB 14.2  HCT 41.3  WBC 6.1  PLT 376   Ct Head Wo Contrast  Result Date: 06/20/2018 CLINICAL DATA:  63 year old male with a history of numbness EXAM: CT HEAD  WITHOUT CONTRAST TECHNIQUE: Contiguous axial images were obtained from the base of the skull through the vertex without intravenous contrast. COMPARISON:  None. FINDINGS: Brain: No acute intracranial hemorrhage. No midline shift or mass effect. Brain volume loss without expansion  of the ventricles. Gray-white differentiation maintained. Patchy hypodensity in the bilateral periventricular white matter. Focal hypodensity in the right basal ganglia. Vascular: Intracranial calcifications Skull: No acute skull fracture.  No aggressive bony lesions. Sinuses/Orbits: No acute finding. Other: None. IMPRESSION: Negative for acute intracranial abnormality. Brain volume loss, with evidence of chronic microvascular ischemic disease and associated intracranial atherosclerosis. Electronically Signed   By: Gilmer MorJaime  Wagner D.O.   On: 06/20/2018 19:24   Dg Chest Port 1 View  Result Date: 06/20/2018 CLINICAL DATA:  Left-sided numbness EXAM: PORTABLE CHEST 1 VIEW COMPARISON:  None. FINDINGS: The heart size and mediastinal contours are within normal limits. Both lungs are clear. The visualized skeletal structures are unremarkable. IMPRESSION: No active disease. Electronically Signed   By: Elige KoHetal  Patel   On: 06/20/2018 19:24    ASSESSMENT / PLAN:  New onset atrial flutter with rvr  Supplemental O2 for dyspnea and/or hypoxia Trend troponin's Echo pending  Continue cardizem gtt  Continue scheduled and prn metoprolol  Left arm and chest numbness  MR Brain pending   ETOH withdrawal symptoms  Prn ativan per CIWA protocol Continue folic acid, mvi, and thiamine  ETOH abuse cessation counseling provided   VTE px: subq lovenox   Sonda Rumbleana , AGNP  Pulmonary/Critical Care Pager [phone] (please enter 7 digits) PCCM Consult Pager [phone] (please enter 7 digits)

## 2018-06-21 ENCOUNTER — Inpatient Hospital Stay: Payer: Self-pay

## 2018-06-21 ENCOUNTER — Inpatient Hospital Stay (HOSPITAL_COMMUNITY)
Admit: 2018-06-21 | Discharge: 2018-06-21 | Disposition: A | Discharge status: Home / Self Care | Payer: Self-pay | Attending: Internal Medicine | Admitting: Internal Medicine

## 2018-06-21 DIAGNOSIS — F1910 Other psychoactive substance abuse, uncomplicated: Secondary | ICD-10-CM

## 2018-06-21 DIAGNOSIS — R200 Anesthesia of skin: Secondary | ICD-10-CM

## 2018-06-21 DIAGNOSIS — I4892 Unspecified atrial flutter: Secondary | ICD-10-CM

## 2018-06-21 DIAGNOSIS — I483 Typical atrial flutter: Secondary | ICD-10-CM

## 2018-06-21 LAB — COMPREHENSIVE METABOLIC PANEL
ALT: 22 U/L (ref 0–44)
AST: 38 U/L (ref 15–41)
Albumin: 3.8 g/dL (ref 3.5–5.0)
Alkaline Phosphatase: 68 U/L (ref 38–126)
Anion gap: 16 — ABNORMAL HIGH (ref 5–15)
BUN: 12 mg/dL (ref 8–23)
CO2: 21 mmol/L — ABNORMAL LOW (ref 22–32)
Calcium: 8.2 mg/dL — ABNORMAL LOW (ref 8.9–10.3)
Chloride: 103 mmol/L (ref 98–111)
Creatinine, Ser: 1.05 mg/dL (ref 0.61–1.24)
GFR calc Af Amer: >60 mL/min (ref >60)
GFR calc non Af Amer: >60 mL/min (ref >60)
Glucose, Bld: 84 mg/dL (ref 70–99)
Potassium: 4.2 mmol/L (ref 3.5–5.1)
Sodium: 140 mmol/L (ref 135–145)
Total Bilirubin: 0.8 mg/dL (ref 0.3–1.2)
Total Protein: 7 g/dL (ref 6.5–8.1)

## 2018-06-21 LAB — CBC
HCT: 38.5 % — ABNORMAL LOW (ref 39.0–52.0)
Hemoglobin: 12.9 g/dL — ABNORMAL LOW (ref 13.0–17.0)
MCH: 28.7 pg (ref 26.0–34.0)
MCHC: 33.5 g/dL (ref 30.0–36.0)
MCV: 85.7 fL (ref 80.0–100.0)
Platelets: 327 10*3/uL (ref 150–400)
RBC: 4.49 MIL/uL (ref 4.22–5.81)
RDW: 13.3 % (ref 11.5–15.5)
WBC: 5.9 10*3/uL (ref 4.0–10.5)
nRBC: 0 % (ref 0.0–0.2)

## 2018-06-21 LAB — ECHOCARDIOGRAM COMPLETE
Height: 65 in
Weight: 2790.14 oz

## 2018-06-21 LAB — TSH: TSH: 0.751 u[IU]/mL (ref 0.350–4.500)

## 2018-06-21 LAB — APTT: aPTT: 29 seconds (ref 24–36)

## 2018-06-21 LAB — HEPARIN LEVEL (UNFRACTIONATED): Heparin Unfractionated: 0.47 IU/mL (ref 0.30–0.70)

## 2018-06-21 MED ORDER — HEPARIN BOLUS VIA INFUSION
4000.0000 [IU] | Freq: Once | INTRAVENOUS | Status: AC
  Administered 2018-06-21: 4000 [IU] via INTRAVENOUS
  Filled 2018-06-21: qty 4000

## 2018-06-21 MED ORDER — LORAZEPAM 1 MG PO TABS: 1.0000 mg | ORAL_TABLET | ORAL | Status: DC | PRN

## 2018-06-21 MED ORDER — HYDRALAZINE HCL 20 MG/ML IJ SOLN
10.0000 mg | Freq: Four times a day (QID) | INTRAMUSCULAR | Status: DC | PRN
  Administered 2018-06-21 – 2018-06-22 (×2): 10 mg via INTRAVENOUS
  Filled 2018-06-21 (×2): qty 1

## 2018-06-21 MED ORDER — METOPROLOL TARTRATE 50 MG PO TABS
50.0000 mg | ORAL_TABLET | Freq: Four times a day (QID) | ORAL | Status: DC
  Administered 2018-06-21 – 2018-06-22 (×2): 50 mg via ORAL
  Filled 2018-06-21 (×3): qty 1

## 2018-06-21 MED ORDER — CALCIUM CARBONATE ANTACID 500 MG PO CHEW
1.0000 | CHEWABLE_TABLET | ORAL | Status: DC | PRN
  Administered 2018-06-21: 200 mg via ORAL
  Filled 2018-06-21: qty 1

## 2018-06-21 MED ORDER — HEPARIN (PORCINE) 25000 UT/250ML-% IV SOLN
1100.0000 [IU]/h | INTRAVENOUS | Status: DC
  Administered 2018-06-21 – 2018-06-22 (×2): 1100 [IU]/h via INTRAVENOUS
  Filled 2018-06-21 (×2): qty 250

## 2018-06-21 MED ORDER — PANTOPRAZOLE SODIUM 40 MG PO TBEC
40.0000 mg | DELAYED_RELEASE_TABLET | Freq: Every day | ORAL | Status: DC
  Administered 2018-06-21 – 2018-06-22 (×2): 40 mg via ORAL
  Filled 2018-06-21 (×2): qty 1

## 2018-06-21 MED ORDER — LORAZEPAM 2 MG/ML IJ SOLN: 1.0000 mg | INTRAMUSCULAR | Status: DC | PRN

## 2018-06-21 NOTE — Progress Notes (Signed)
[facility] [place] [facility]   PATIENT NAME: Jacob Waters    MR#:  [id number]  DATE OF BIRTH:  November 14, 1955  SUBJECTIVE:  CHIEF COMPLAINT:   Chief Complaint  Patient presents with  . Numbness   His left numbness is better. No SOB or CP  Continues to be tachycardic into the 140s  Refused MRI of the brain overnight REVIEW OF SYSTEMS:    Review of Systems  Constitutional: Positive for malaise/fatigue. Negative for chills and fever.  HENT: Negative for sore throat.   Eyes: Negative for blurred vision, double vision and pain.  Respiratory: Negative for cough, hemoptysis, shortness of breath and wheezing.   Cardiovascular: Positive for palpitations. Negative for chest pain, orthopnea and leg swelling.  Gastrointestinal: Negative for abdominal pain, constipation, diarrhea, heartburn, nausea and vomiting.  Genitourinary: Negative for dysuria and hematuria.  Musculoskeletal: Negative for back pain and joint pain.  Skin: Negative for rash.  Neurological: Negative for sensory change, speech change, focal weakness and headaches.  Endo/Heme/Allergies: Does not bruise/bleed easily.  Psychiatric/Behavioral: Negative for depression. The patient is nervous/anxious.     DRUG ALLERGIES:  Not on File  VITALS:  Blood pressure 104/83, pulse (!) 156, temperature 98.1 F (36.7 C), temperature source Oral, resp. rate (!) 23, height 5\' 5"  (1.651 m), weight 79.1 kg, SpO2 97 %.  PHYSICAL EXAMINATION:   Physical Exam  GENERAL:  63 y.o.-year-old patient lying in the bed with no acute distress.  EYES: Pupils equal, round, reactive to light and accommodation. No scleral icterus. Extraocular muscles intact.  HEENT: Head atraumatic, normocephalic. Oropharynx and nasopharynx clear.  NECK:  Supple, no jugular venous distention. No thyroid enlargement, no tenderness.  LUNGS: Normal breath sounds bilaterally, no wheezing, rales, rhonchi. No use of accessory muscles of respiration.   CARDIOVASCULAR: S1, S2 , tachycardia ABDOMEN: Soft, nontender, nondistended. Bowel sounds present. No organomegaly or mass.  EXTREMITIES: No cyanosis, clubbing or edema b/l.    NEUROLOGIC: Cranial nerves II through XII are intact. No focal Motor or sensory deficits b/l.   PSYCHIATRIC: The patient is alert and oriented x 3.  Anxious SKIN: No obvious rash, lesion, or ulcer.   LABORATORY PANEL:   CBC Recent Labs  Lab 06/21/18 0415  WBC 5.9  HGB 12.9*  HCT 38.5*  PLT 327   ------------------------------------------------------------------------------------------------------------------ Chemistries  Recent Labs  Lab 06/20/18 1825 06/21/18 0415  NA 135 140  K 4.2 4.2  CL 95* 103  CO2 22 21*  GLUCOSE 125* 84  BUN 12 12  CREATININE 1.08 1.05  CALCIUM 8.6* 8.2*  MG 2.0  --   AST 51* 38  ALT 27 22  ALKPHOS 78 68  BILITOT 0.7 0.8   ------------------------------------------------------------------------------------------------------------------  Cardiac Enzymes Recent Labs  Lab 06/20/18 1825  TROPONINI <0.03   ------------------------------------------------------------------------------------------------------------------  RADIOLOGY:  Ct Head Wo Contrast  Result Date: 06/20/2018 CLINICAL DATA:  63 year old male with a history of numbness EXAM: CT HEAD WITHOUT CONTRAST TECHNIQUE: Contiguous axial images were obtained from the base of the skull through the vertex without intravenous contrast. COMPARISON:  None. FINDINGS: Brain: No acute intracranial hemorrhage. No midline shift or mass effect. Brain volume loss without expansion of the ventricles. Gray-white differentiation maintained. Patchy hypodensity in the bilateral periventricular white matter. Focal hypodensity in the right basal ganglia. Vascular: Intracranial calcifications Skull: No acute skull fracture.  No aggressive bony lesions. Sinuses/Orbits: No acute finding. Other: None. IMPRESSION: Negative for acute  intracranial abnormality. Brain volume loss, with evidence of chronic microvascular  ischemic disease and associated intracranial atherosclerosis. Electronically Signed   By: Gilmer MorJaime  Wagner D.O.   On: 06/20/2018 19:24   Dg Chest Port 1 View  Result Date: 06/20/2018 CLINICAL DATA:  Left-sided numbness EXAM: PORTABLE CHEST 1 VIEW COMPARISON:  None. FINDINGS: The heart size and mediastinal contours are within normal limits. Both lungs are clear. The visualized skeletal structures are unremarkable. IMPRESSION: No active disease. Electronically Signed   By: Elige KoHetal  Patel   On: 06/20/2018 19:24     ASSESSMENT AND PLAN:   * Atrial flutter, new onset with rapid ventricular rate in the 160s.   TSH pending Continue Cardizem drip.  On metoprolol 50 mg twice daily. Consulted cardiology.  Will await for input. 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  His symptoms could be from the atrial flutter.  But with atrial flutter he is at increased risk of stroke.  We will get an MRI of the brain.  Will need further work-up if MRI shows a stroke.  At this time patient has been started on aspirin and neurochecks.  *Alcohol abuse.   CIWA protocol  *Hypertension.  Started on Cardizem drip and metoprolol.  *DVT prophylaxis with Lovenox  All the records are reviewed and case discussed with Care Management/Social Worker Management plans discussed with the patient, family and they are in agreement.  CODE STATUS: Full code  DVT Prophylaxis: SCDs  TOTAL TIME TAKING CARE OF THIS PATIENT: 35 minutes.   POSSIBLE D/C IN 2-3 DAYS, DEPENDING ON CLINICAL CONDITION.  Orie FishermanSrikar R Omesha Bowerman M.D on 06/21/2018 at 9:49 AM  Between 7am to 6pm - Pager - [phone]  After 6pm go to [email or web address] - password [email or web address]  [facility]  Office   [phone]  CC: Primary care physician; Patient, No Pcp Per  Note: This dictation was prepared with Dragon dictation along with smaller phrase technology. Any transcriptional errors that result from this process are unintentional.

## 2018-06-21 NOTE — Progress Notes (Signed)
Pt converted out of afib to SB in the 50s. Cardizem gtt stopped per order, Dr. Okey Dupre (cardiology) notified. Will remain on scheduled metoprolol as ordered and continue to monitor.

## 2018-06-21 NOTE — Progress Notes (Signed)
Pt transported for MR Brain, however upon arrival to MRI pt anxious.  Pt offered prn ativan, however he refused medication and stated he did not want to proceed with the MRI.  He states he understands why the test was ordered but still refuses to proceed.  He is currently alert, oriented, and states the numbness in his left arm and chest has resolved some.   Will continue to monitor and assess pt.  Sonda Rumble, AGNP  Pulmonary/Critical Care Pager [phone] (please enter 7 digits) PCCM Consult Pager [phone] (please enter 7 digits)

## 2018-06-21 NOTE — Progress Notes (Signed)
*  PRELIMINARY RESULTS* Echocardiogram 2D Echocardiogram has been performed.  Cristela Blue 06/21/2018, 10:25 AM

## 2018-06-21 NOTE — Consult Note (Signed)
ANTICOAGULATION CONSULT NOTE - Initial Consult  Pharmacy Consult for Heparin Indication: atrial fibrillation  Patient Measurements: Height: 5\' 5"  (165.1 cm) Weight: 174 lb 6.1 oz (79.1 kg) IBW/kg (Calculated) : 61.5 Heparin Dosing Weight: 77.5 kg  Vital Signs: Temp: 98.1 F (36.7 C) (04/14 0200) Temp Source: Oral (04/14 0200) BP: 135/75 (04/14 1100) Pulse Rate: 81 (04/14 1100)  Labs: Recent Labs    06/20/18 1825 06/21/18 0415 06/21/18 1127  HGB 14.2 12.9*  --   HCT 41.3 38.5*  --   PLT 376 327  --   APTT  --   --  29  LABPROT 13.0  --   --   INR 1.0  --   --   CREATININE 1.08 1.05  --   TROPONINI <0.03  --   --     Estimated Creatinine Clearance: 69.8 mL/min (by C-G formula based on SCr of 1.05 mg/dL).   Medical History: Past Medical History:  Diagnosis Date  . Hypertension     Medications:  No anticoagulants PTA.  Assessment: PMH significant for hypertension (untreated) and alcohol abuse. Patient presented on 4/13 with atrial flutter. Hgb, PLT trending down: continue to monitor  Goal of Therapy:  Heparin level 0.3-0.7 units/ml Monitor platelets by anticoagulation protocol: Yes  Heparin Course:  4/14 AM initiation: 4000 units, then 1100 units/hr 4/14 1811 HL 0.47   Plan:  --continue heparin infusion at 1100 units/hr.  --second confirmatory HL ordered for 0100 4/15 --CBC with AM labs.  Pharmacy will continue to monitor.  Lowella Bandy, PharmD Clinical Pharmacist 06/21/2018 12:13 PM

## 2018-06-21 NOTE — Consult Note (Signed)
Cardiology Consultation:   Patient ID: Mehkai Gallo MRN: [id number]; DOB: 1955-10-07  Admit date: 06/20/2018 Date of Consult: 06/21/2018  Primary Care Provider: Patient, No Pcp Per Primary Cardiologist: Yvonne Kendall, MD  Primary Electrophysiologist:  None    Patient Profile:   Antonyo Hinderer is a 63 y.o. male with a hx of new onset paroxysmal atrial flutter/atrial fibrillation, HTN, current alcohol abuse, and current tobacco abuse who is being seen today for the evaluation of new onset atrial flutter with variable block including 2:1 block at the request of Dr. Elpidio Anis.  History of Present Illness:   Mr. Decesare is a 63 year old male with no known PMH other than hypertension and current alcohol /tobacco abuse. In the past, he saw a physician for HTN and received an unknown antihypertensive medication; however, he has not refilled his medication since he last ran out and for some time.  He also does not follow regularly with a physician or own a blood pressure cuff at home to monitor his blood pressure.  He reportedly is not physically active. He currently drinks 2 "boot-leggers" (small $2 bottles) of wine, a pack of beer, and "white liquor" daily.  He also reports a pack of chewing tobacco daily. He has had this amount of alcohol and chewing tobacco, "since before he can remember." He denied any past or current illegal drug use. He has no known family history of heart disease or arrhythmia other than his father, who died of a stroke in his 19s.  Several days ago, the patient was reportedly weed whacking when he felt left arm and flank numbness without weakness.  He reported that he has never had any symptoms like this before in the past.  He also denied a past history of arrhythmia or stroke. He reported no associated sx, including chest pain, palpitations, or feeling of racing heart rate.  No reported shortness of breath, orthopnea, or lower extremity edema. He also denied nausea, emesis, syncope,  or pre-syncope. No aggravating or alleviating factors. No associated speech or additional neurological symptoms. He denied recent injury to the area. He stated that he had waited to present to the [facility] ED, as he had hoped his left arm and flank numbness would dissipate after a few days of increased alcohol use. However, he became concerned when this was not the case, and his numbness persisted and was unchanged in severity. He thus decided to present to [facility] ED on 06/20/2018.  In the ED, vitals were significant for elevated rates into the high 160s and elevated rates with atrial fibrillation with variable block including 2:1 block, atrial flutter, and sinus tachycardia. SpO2 95% ORA. EKG showed atrial flutter with ventricular rate 83bpm. He was not considered a candidate for TPA due to the length at which he had experienced symptoms by the time of presentation. CT was negative for acute bleed and MRI ordered to r/o stroke and pending official results. Valsalva was performed without change in HR and he was started on IV Cardizem and admitted with cardiology consulted.   Past Medical History:  Diagnosis Date  . Hypertension     History reviewed. No pertinent surgical history.   Home Medications:  Prior to Admission medications   Not on File    Inpatient Medications: Scheduled Meds: . aspirin EC  325 mg Oral Daily  . enoxaparin (LOVENOX) injection  40 mg Subcutaneous Q24H  . folic acid  1 mg Oral Daily  . Influenza vac split quadrivalent PF  0.5 mL Intramuscular Tomorrow-1000  .  metoprolol tartrate  50 mg Oral BID  . multivitamin with minerals  1 tablet Oral Daily  . pneumococcal 23 valent vaccine  0.5 mL Intramuscular Tomorrow-1000  . sodium chloride flush  3 mL Intravenous Q12H  . thiamine  100 mg Oral Daily   Or  . thiamine  100 mg Intravenous Daily   Continuous Infusions: . diltiazem (CARDIZEM) infusion 10 mg/hr (06/21/18 0820)  . lactated ringers 100 mL/hr at 06/20/18 2237   PRN  Meds: acetaminophen **OR** acetaminophen, albuterol, LORazepam **OR** LORazepam, ondansetron **OR** ondansetron (ZOFRAN) IV, polyethylene glycol  Allergies:   Not on File  Social History:   Social History   Socioeconomic History  . Marital status: Single    Spouse name: Not on file  . Number of children: Not on file  . Years of education: Not on file  . Highest education level: Not on file  Occupational History  . Not on file  Social Needs  . Financial resource strain: Not on file  . Food insecurity:    Worry: Not on file    Inability: Not on file  . Transportation needs:    Medical: Not on file    Non-medical: Not on file  Tobacco Use  . Smoking status: Current Every Day Smoker  . Smokeless tobacco: Never Used  Substance and Sexual Activity  . Alcohol use: Yes  . Drug use: Never  . Sexual activity: Not on file  Lifestyle  . Physical activity:    Days per week: Not on file    Minutes per session: Not on file  . Stress: Not on file  Relationships  . Social connections:    Talks on phone: Not on file    Gets together: Not on file    Attends religious service: Not on file    Active member of club or organization: Not on file    Attends meetings of clubs or organizations: Not on file    Relationship status: Not on file  . Intimate partner violence:    Fear of current or ex partner: Not on file    Emotionally abused: Not on file    Physically abused: Not on file    Forced sexual activity: Not on file  Other Topics Concern  . Not on file  Social History Narrative  . Not on file    Family History:   History reviewed. No pertinent family history.   FATHER: died of stroke in his 66s  ROS:  Please see the history of present illness.  Review of Systems  Constitutional: Negative for chills, fever and malaise/fatigue.  Respiratory: Negative for cough, hemoptysis, sputum production and wheezing.        "shortness of breath in the past but not since this numbness  started"  Cardiovascular: Negative for chest pain, palpitations, orthopnea, leg swelling and PND.  Gastrointestinal: Negative for nausea and vomiting.  Genitourinary: Negative for dysuria.  Neurological: Positive for sensory change. Negative for dizziness, speech change, focal weakness, seizures, loss of consciousness, weakness and headaches.       L arm and L flank numbness  Psychiatric/Behavioral:       Alcohol and tobacco abuse  All other systems reviewed and are negative.   All other ROS reviewed and negative.     Physical Exam/Data:   Vitals:   06/21/18 0500 06/21/18 0600 06/21/18 0700 06/21/18 0800  BP: 139/70 (!) 151/83 (!) 161/84 104/83  Pulse: 69 94 97 (!) 156  Resp: (!) 23 (!) 24 20 (!)  23  Temp:      TempSrc:      SpO2: 95% 95% 97% 97%  Weight:      Height:        Intake/Output Summary (Last 24 hours) at 06/21/2018 0914 Last data filed at 06/21/2018 0800 Gross per 24 hour  Intake -  Output 1000 ml  Net -1000 ml   Filed Weights   06/20/18 1851 06/20/18 2210  Weight: 77.1 kg 79.1 kg   Body mass index is 29.02 kg/m.  General:  Well nourished, well developed, in no acute distress. Watching television and talking on the phone prior to exam. HEENT: normal Neck: no JVD Vascular: No carotid bruits; FA pulses 2+ bilaterally without bruits  Cardiac:  normal S1, S2; tachycardic but regular; no murmur appreciated Lungs:  clear to auscultation bilaterally, no wheezing, rhonchi or rales  Abd: soft, nontender, no hepatomegaly  Ext: no bilateral lower extremity edema, no edema of the bilateral upper extremities Musculoskeletal:  No deformities, BUE and BLE strength normal and equal Skin: warm and dry  Neuro:  CNs 2-12 intact, no focal abnormalities noted Psych:  Normal affect   EKG:  The EKG was personally reviewed and demonstrates:  Atrial flutter, 83bpm Telemetry:  Telemetry was personally reviewed and demonstrates:  Atrial flutter with variable block including 2:1  block, atrial fibrillation with RVR, sinus tachycardia and rates from 70s-160s  Relevant CV Studies:  *Pending echo  Laboratory Data:  Chemistry Recent Labs  Lab 06/20/18 1825 06/21/18 0415  NA 135 140  K 4.2 4.2  CL 95* 103  CO2 22 21*  GLUCOSE 125* 84  BUN 12 12  CREATININE 1.08 1.05  CALCIUM 8.6* 8.2*  GFRNONAA >60 >60  GFRAA >60 >60  ANIONGAP 18* 16*    Recent Labs  Lab 06/20/18 1825 06/21/18 0415  PROT 8.1 7.0  ALBUMIN 4.5 3.8  AST 51* 38  ALT 27 22  ALKPHOS 78 68  BILITOT 0.7 0.8   Hematology Recent Labs  Lab 06/20/18 1825 06/21/18 0415  WBC 6.1 5.9  RBC 4.92 4.49  HGB 14.2 12.9*  HCT 41.3 38.5*  MCV 83.9 85.7  MCH 28.9 28.7  MCHC 34.4 33.5  RDW 13.2 13.3  PLT 376 327   Cardiac Enzymes Recent Labs  Lab 06/20/18 1825  TROPONINI <0.03   No results for input(s): TROPIPOC in the last 168 hours.  BNP Recent Labs  Lab 06/20/18 1825  BNP 414.0*    DDimer No results for input(s): DDIMER in the last 168 hours.  Radiology/Studies:  Ct Head Wo Contrast  Result Date: 06/20/2018 CLINICAL DATA:  63 year old male with a history of numbness EXAM: CT HEAD WITHOUT CONTRAST TECHNIQUE: Contiguous axial images were obtained from the base of the skull through the vertex without intravenous contrast. COMPARISON:  None. FINDINGS: Brain: No acute intracranial hemorrhage. No midline shift or mass effect. Brain volume loss without expansion of the ventricles. Gray-white differentiation maintained. Patchy hypodensity in the bilateral periventricular white matter. Focal hypodensity in the right basal ganglia. Vascular: Intracranial calcifications Skull: No acute skull fracture.  No aggressive bony lesions. Sinuses/Orbits: No acute finding. Other: None. IMPRESSION: Negative for acute intracranial abnormality. Brain volume loss, with evidence of chronic microvascular ischemic disease and associated intracranial atherosclerosis. Electronically Signed   By: Gilmer MorJaime  Wagner D.O.    On: 06/20/2018 19:24   Dg Chest Port 1 View  Result Date: 06/20/2018 CLINICAL DATA:  Left-sided numbness EXAM: PORTABLE CHEST 1 VIEW COMPARISON:  None. FINDINGS: The heart  size and mediastinal contours are within normal limits. Both lungs are clear. The visualized skeletal structures are unremarkable. IMPRESSION: No active disease. Electronically Signed   By: Elige Ko   On: 06/20/2018 19:24    Assessment and Plan:   New Onset Paroxysmal Atrial Fibrillation with RVR /  Atrial Flutter with 2:1 block - L arm / L flank numbness for several days but otherwise asx.  - Onset of Afib/Aflutter unknown and presumed new in onset. Current EtOH use. No known family or personal h/o arrhythmia but unclear if he has been in this rhythm in the past or for how long he has been in and out of Afib/Aflutter.  - Telemetry shows variable HR as in and out of Afib/flutter with variable block including 2:1 block and with intermittent SR/sinus tachycardia. EKG shows Aflutter at 83bpm. - Rate control has been with Cardizem and metoprolol 50mg  BID this admission and currently soft BP/hypotensive with variable rates. Titrate for rate control as BP allows. With continued hypotension, and pending echo / EF assessment, could consider gentle hydration to assess if improves rates and stabilizes BP.  - Started on heparin drip as CT negative for acute bleed and no signs or symptoms of bleeding on exam. Not previously anticoagulated and in /out of Afib/flutter on telemetry. Current c/o numbness. MRI pending to r/o stroke.  - Recommendation for long term anticoagulation at discharge with either Xarelto or Eliquis. CHA2DS2VASc score of at least 2 (HTN, CT atherosclerosis). Pending TSH, A1C, and echocardiogram for further risk stratification. Alcohol cessation is recommended to prevent further Afib/flutter and with long term anticoagulation to prevent risk of bleed.   - No indication for TEE / DCCV at this time given Afib/flutter is  paroxysmal with intermittent sinus rhythm / sinus tachycardia and as patient not previously anticoagulated. As above, pending TTE.   HTN,  - Currently soft BP on po metoprolol 50mg  BID and Cardizem drip. Pending echo to assess EF.  Continue to monitor blood pressure as labile with episodes of HTN and hypotension since alcohol cessation / admission. Titrate rate controlling medications as BP allows and could consider gentle hydration with continued hypotension. Earlier episodes of hypertension likely in the setting of rebound / alcohol withdrawal.   AKI/CKD - Baseline renal function unknown. Daily BMET recommended to monitor renal function and electrolytes. Cr 1.05, K 4.2.   For questions or updates, please contact [facility] Please consult [email or web address] for contact info under     Signed, Lennon Alstrom, PA-C  06/21/2018 9:14 AM

## 2018-06-21 NOTE — Consult Note (Signed)
ANTICOAGULATION CONSULT NOTE - Initial Consult  Pharmacy Consult for Heparin Indication: atrial fibrillation  Not on File  Patient Measurements: Height: 5\' 5"  (165.1 cm) Weight: 174 lb 6.1 oz (79.1 kg) IBW/kg (Calculated) : 61.5 Heparin Dosing Weight: 77.5 kg  Vital Signs: Temp: 98.1 F (36.7 C) (04/14 0200) Temp Source: Oral (04/14 0200) BP: 127/113 (04/14 1000) Pulse Rate: 149 (04/14 1000)  Labs: Recent Labs    06/20/18 1825 06/21/18 0415  HGB 14.2 12.9*  HCT 41.3 38.5*  PLT 376 327  LABPROT 13.0  --   INR 1.0  --   CREATININE 1.08 1.05  TROPONINI <0.03  --     Estimated Creatinine Clearance: 69.8 mL/min (by C-G formula based on SCr of 1.05 mg/dL).   Medical History: Past Medical History:  Diagnosis Date  . Hypertension     Medications:  No anticoagulants PTA.  Assessment: PMH significant for hypertension (untreated) and alcohol abuse. Patient presented on 4/13 with atrial flutter.   Goal of Therapy:  Heparin level 0.3-0.7 units/ml Monitor platelets by anticoagulation protocol: Yes   Plan:  Will order baseline aPTT. H&H WNL. Will order heparin bolus of 4000 units and continuous infusion of 1100 units/hr. Will orderHL for 1800 and CBC with AM labs.  Pharmacy will continue to monitor.   Mauri Reading, PharmD Pharmacy Resident  06/21/2018 11:16 AM

## 2018-06-22 ENCOUNTER — Telehealth: Payer: Self-pay | Admitting: Internal Medicine

## 2018-06-22 DIAGNOSIS — R780 Finding of alcohol in blood: Secondary | ICD-10-CM

## 2018-06-22 DIAGNOSIS — I10 Essential (primary) hypertension: Secondary | ICD-10-CM

## 2018-06-22 LAB — BASIC METABOLIC PANEL
Anion gap: 10 (ref 5–15)
BUN: 16 mg/dL (ref 8–23)
CO2: 26 mmol/L (ref 22–32)
Calcium: 8.8 mg/dL — ABNORMAL LOW (ref 8.9–10.3)
Chloride: 103 mmol/L (ref 98–111)
Creatinine, Ser: 1.22 mg/dL (ref 0.61–1.24)
GFR calc Af Amer: >60 mL/min (ref >60)
GFR calc non Af Amer: >60 mL/min (ref >60)
Glucose, Bld: 101 mg/dL — ABNORMAL HIGH (ref 70–99)
Potassium: 3.9 mmol/L (ref 3.5–5.1)
Sodium: 139 mmol/L (ref 135–145)

## 2018-06-22 LAB — CBC
HCT: 34.5 % — ABNORMAL LOW (ref 39.0–52.0)
Hemoglobin: 11.5 g/dL — ABNORMAL LOW (ref 13.0–17.0)
MCH: 28.8 pg (ref 26.0–34.0)
MCHC: 33.3 g/dL (ref 30.0–36.0)
MCV: 86.3 fL (ref 80.0–100.0)
Platelets: 265 10*3/uL (ref 150–400)
RBC: 4 MIL/uL — ABNORMAL LOW (ref 4.22–5.81)
RDW: 13.1 % (ref 11.5–15.5)
WBC: 5.9 10*3/uL (ref 4.0–10.5)
nRBC: 0 % (ref 0.0–0.2)

## 2018-06-22 LAB — HIV ANTIBODY (ROUTINE TESTING W REFLEX): HIV Screen 4th Generation wRfx: NONREACTIVE

## 2018-06-22 LAB — HEMOGLOBIN A1C
Hgb A1c MFr Bld: 6 % — ABNORMAL HIGH (ref 4.8–5.6)
Mean Plasma Glucose: 126 mg/dL

## 2018-06-22 LAB — HEPARIN LEVEL (UNFRACTIONATED): Heparin Unfractionated: 0.39 IU/mL (ref 0.30–0.70)

## 2018-06-22 MED ORDER — ASPIRIN EC 81 MG PO TBEC: 81.0000 mg | DELAYED_RELEASE_TABLET | Freq: Every day | ORAL | 0 refills | Status: DC

## 2018-06-22 MED ORDER — AMLODIPINE BESYLATE 10 MG PO TABS
10.0000 mg | ORAL_TABLET | Freq: Every day | ORAL | Status: DC
  Administered 2018-06-22: 10 mg via ORAL
  Filled 2018-06-22: qty 1

## 2018-06-22 MED ORDER — METOPROLOL TARTRATE 100 MG PO TABS: 100.0000 mg | ORAL_TABLET | Freq: Two times a day (BID) | ORAL | 0 refills | Status: DC

## 2018-06-22 MED ORDER — AMLODIPINE BESYLATE 10 MG PO TABS: 10.0000 mg | ORAL_TABLET | Freq: Every day | ORAL | 0 refills | Status: DC

## 2018-06-22 MED ORDER — ASPIRIN EC 81 MG PO TBEC
81.0000 mg | DELAYED_RELEASE_TABLET | Freq: Every day | ORAL | Status: DC
  Administered 2018-06-22: 81 mg via ORAL
  Filled 2018-06-22: qty 1

## 2018-06-22 NOTE — Progress Notes (Signed)
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 Has had chronic discomfort in his left arm often relieved by pushing with fingers on the right hand posterior shoulder region on the left, makes the numbness and discomfort go away -He does do weed whacking Mixed story, have reported having some left flank numbness tingling, now reports it is just left arm -He does not know when he is in flutter, did not feel it  Inpatient Medications    Scheduled Meds:  folic acid  1 mg Oral Daily   metoprolol tartrate  50 mg Oral Q6H   multivitamin with minerals  1 tablet Oral Daily   pantoprazole  40 mg Oral Q1200   pneumococcal 23 valent vaccine  0.5 mL Intramuscular Tomorrow-1000   sodium chloride flush  3 mL Intravenous Q12H   thiamine  100 mg Oral Daily   Or   thiamine  100 mg Intravenous Daily   Continuous Infusions:  heparin 1,100 Units/hr (06/22/18 0600)   PRN Meds: acetaminophen **OR** acetaminophen, albuterol, calcium carbonate, hydrALAZINE, LORazepam **OR** LORazepam, [DISCONTINUED] ondansetron **OR** ondansetron (ZOFRAN) IV, polyethylene glycol   Vital Signs    Vitals:   06/22/18 0400 06/22/18 0500 06/22/18 0600 06/22/18 0800  BP: (!) 178/88  (!) 164/84 (!) 110/54  Pulse: (!) 32  61 93  Resp: (!) 25  17 (!) 23  Temp:      TempSrc:      SpO2: 96%  100% 100%  Weight:  80.4 kg    Height:        Intake/Output Summary (Last 24 hours) at 06/22/2018 0957 Last data filed at 06/22/2018 0933 Gross per 24 hour  Intake 2256.55 ml  Output 1175 ml  Net 1081.55 ml   Last 3 Weights 06/22/2018 06/20/2018 06/20/2018  Weight (lbs) 177 lb 4 oz 174 lb 6.1 oz 170 lb  Weight (kg) 80.4 kg 79.1 kg 77.111 kg       Telemetry    Atrial flutter converting to normal sinus rhythm last night- Personally Reviewed  ECG     - Personally Reviewed  Physical Exam   GEN: No acute distress.   Neck: No JVD Cardiac: RRR, no murmurs, rubs, or gallops.  Respiratory: Clear to auscultation bilaterally. GI: Soft, nontender, non-distended  MS: No edema; No deformity. Neuro:  Nonfocal  Psych: Normal affect   Labs    Chemistry Recent Labs  Lab 06/20/18 1825 06/21/18 0415 06/22/18 0051  NA 135 140 139  K 4.2 4.2 3.9  CL 95* 103 103  CO2 22 21* 26  GLUCOSE 125* 84 101*  BUN 12 12 16   CREATININE 1.08 1.05 1.22  CALCIUM 8.6* 8.2* 8.8*  PROT 8.1 7.0  --   ALBUMIN 4.5 3.8  --   AST 51* 38  --   ALT 27 22  --   ALKPHOS 78 68  --   BILITOT 0.7 0.8  --   GFRNONAA >60 >60 >60  GFRAA >60 >60 >60  ANIONGAP 18* 16* 10     Hematology Recent Labs  Lab 06/20/18 1825 06/21/18 0415 06/22/18 0051  WBC 6.1 5.9 5.9  RBC 4.92 4.49 4.00*  HGB 14.2 12.9* 11.5*  HCT 41.3 38.5* 34.5*  MCV 83.9  85.7 86.3  MCH 28.9 28.7 28.8  MCHC 34.4 33.5 33.3  RDW 13.2 13.3 13.1  PLT 376 327 265    Cardiac Enzymes Recent Labs  Lab 06/20/18 1825  TROPONINI <0.03   No results for input(s): TROPIPOC in the last 168 hours.   BNP Recent Labs  Lab 06/20/18 1825  BNP 414.0*     DDimer No results for input(s): DDIMER in the last 168 hours.   Radiology    Ct Head Wo Contrast  Result Date: 06/20/2018 CLINICAL DATA:  63 year old male with a history of numbness EXAM: CT HEAD WITHOUT CONTRAST TECHNIQUE: Contiguous axial images were obtained from the base of the skull through the vertex without intravenous contrast. COMPARISON:  None. FINDINGS: Brain: No acute intracranial hemorrhage. No midline shift or mass effect. Brain volume loss without expansion of the ventricles. Gray-white differentiation maintained. Patchy hypodensity in the bilateral periventricular white matter. Focal hypodensity in the right basal ganglia.  Vascular: Intracranial calcifications Skull: No acute skull fracture.  No aggressive bony lesions. Sinuses/Orbits: No acute finding. Other: None. IMPRESSION: Negative for acute intracranial abnormality. Brain volume loss, with evidence of chronic microvascular ischemic disease and associated intracranial atherosclerosis. Electronically Signed   By: Gilmer Mor D.O.   On: 06/20/2018 19:24   Dg Chest Port 1 View  Result Date: 06/20/2018 CLINICAL DATA:  Left-sided numbness EXAM: PORTABLE CHEST 1 VIEW COMPARISON:  None. FINDINGS: The heart size and mediastinal contours are within normal limits. Both lungs are clear. The visualized skeletal structures are unremarkable. IMPRESSION: No active disease. Electronically Signed   By: Elige Ko   On: 06/20/2018 19:24    Cardiac Studies  Echocardiogram performed yesterday  1. The left ventricle has low normal systolic function, with an ejection fraction of 50-55%. The cavity size was normal. Left ventricular diastolic function could not be evaluated due to nondiagnostic images.  2. Evaluation of LVEF is limited due to tachycardia and poor apical windows. Consider limited echo once atrial flutter/tachycardia has resolved.  3. The right ventricle has normal systolic function. The cavity was normal. There is no increase in right ventricular wall thickness.  4. Left atrial size was not well visualized.  5. The mitral valve was not well visualized. Mild thickening of the mitral valve leaflet.  6. The aortic valve is tricuspid. Mild thickening of the aortic valve. Mild calcification of the aortic valve. Aortic valve regurgitation was not assessed by color flow Doppler.  7. The interatrial septum was not well visualized.   Patient Profile     Jacob Waters is a 63 y/o man with history of hypertension (untreated) and polysubstance abuse (heavy alcohol and tobacco), whom we have been asked to see due to atrial flutter.   Now converted to normal sinus  rhythm  Assessment & Plan   Left-sided paresthesias -Etiology unclear, reports this has been a chronic issue dating back quite some time Numbness left arm likely exacerbated by long hours of mowing and weed whacking He is giving a mixed picture concerning the chronicity but sounds like it is chronic  --- Symptoms relieved by pushing posterior aspect left shoulder relieving the discomfort -This would raise the concern for nerve impingement -It appears MRI head has been ordered to rule out stroke given his presenting atrial fibrillation/flutter  --- Atrial fibrillation flutter CHADSVASC score is 1 (hypertension),  Suspect brought on by heavy alcohol use Now maintaining normal sinus rhythm -Would use aspirin 81 mg daily for cerebrovascular disease, atherosclerosis seen on CT scan Hold-off  on a NOAC -Change metoprolol to metoprolol succinate 100 daily, previously metoprolol tartrate 50 every 6 -We will monitor blood pressure given dramatic drop now 110 systolic  --- alcohol abuse Long discussion with him, often drinks up to a case of beer while out mowing in the hot sun mixed with wine and other beverages -Recommend he drink alternative beverages for his thirst, likely cut back on alcohol Alcohol effects on the heart discussed with him in detail including cardiomyopathy And also discussed liver issue such as cirrhosis   Total encounter time more than 25 minutes  Greater than 50% was spent in counseling and coordination of care with the patient   For questions or updates, please contact [facility] Please consult [email or web address] for contact info under        Signed, Julien Nordmannimothy Verle Wheeling, MD  06/22/2018, 9:57 AM

## 2018-06-22 NOTE — Progress Notes (Signed)
Discharged: Patient left unit via wheelchair with all belongings. Discharge instructions regarding medications and virtual follow up discussed.

## 2018-06-22 NOTE — Consult Note (Signed)
ANTICOAGULATION CONSULT NOTE - Initial Consult  Pharmacy Consult for Heparin Indication: atrial fibrillation  Patient Measurements: Height: 5\' 5"  (165.1 cm) Weight: 174 lb 6.1 oz (79.1 kg) IBW/kg (Calculated) : 61.5 Heparin Dosing Weight: 77.5 kg  Vital Signs: Temp: 98.6 F (37 C) (04/14 2000) Temp Source: Oral (04/14 2000) BP: 157/101 (04/15 0000) Pulse Rate: 66 (04/15 0000)  Labs: Recent Labs    06/20/18 1825 06/21/18 0415 06/21/18 1127 06/21/18 1811 06/22/18 0051  HGB 14.2 12.9*  --   --  11.5*  HCT 41.3 38.5*  --   --  34.5*  PLT 376 327  --   --  265  APTT  --   --  29  --   --   LABPROT 13.0  --   --   --   --   INR 1.0  --   --   --   --   HEPARINUNFRC  --   --   --  0.47 0.39  CREATININE 1.08 1.05  --   --  1.22  TROPONINI <0.03  --   --   --   --     Estimated Creatinine Clearance: 60 mL/min (by C-G formula based on SCr of 1.22 mg/dL).   Medical History: Past Medical History:  Diagnosis Date  . Hypertension     Medications:  No anticoagulants PTA.  Assessment: PMH significant for hypertension (untreated) and alcohol abuse. Patient presented on 4/13 with atrial flutter. Hgb, PLT trending down: continue to monitor  Goal of Therapy:  Heparin level 0.3-0.7 units/ml Monitor platelets by anticoagulation protocol: Yes  Heparin Course:  4/14 AM initiation: 4000 units, then 1100 units/hr 4/14 1811 HL 0.47 4/15 0051 HL 0.39   Plan:  4/15 AM: HL level  0.39. Level now therapeutic 2. Continue heparin infusion at 1100 units/hr.   Will continue to check HL and CBC daily with AM labs per protocol.   Pharmacy will continue to monitor.  Gardner Candle, PharmD, BCPS Clinical Pharmacist 06/22/2018 2:17 AM

## 2018-06-22 NOTE — Telephone Encounter (Signed)
PENDING CALL TO PATIENT Jacob Waters [facility] DC TO DISCUSS CONSENT

## 2018-06-22 NOTE — Discharge Instructions (Signed)
QUIT ALCOHOL °

## 2018-06-23 NOTE — Telephone Encounter (Signed)
No ans no vm   °

## 2018-06-27 ENCOUNTER — Telehealth: Payer: Self-pay | Admitting: Internal Medicine

## 2018-06-27 NOTE — Progress Notes (Signed)
Virtual Visit via Telephone Note   This visit type was conducted due to national recommendations for restrictions regarding the COVID-19 Pandemic (e.g. social distancing) in an effort to limit this patient's exposure and mitigate transmission in our community.  Due to his co-morbid illnesses, this patient is at least at moderate risk for complications without adequate follow up.  This format is felt to be most appropriate for this patient at this time.  The patient did not have access to video technology/had technical difficulties with video requiring transitioning to audio format only (telephone).  All issues noted in this document were discussed and addressed.  No physical exam could be performed with this format.  Please refer to the patient's chart for his  consent to telehealth for [facility].   Evaluation Performed:  Follow-up visit  Date:  06/29/2018   ID:  Jacob Waters Freer, DOB 12/27/1955, MRN [id number]  Patient Location: Home Provider Location: Office  PCP:  Patient, No Pcp Per  Cardiologist:  Yvonne Kendallhristopher Eyvonne Burchfield, MD Electrophysiologist:  None   Chief Complaint: Follow-up atrial flutter  History of Present Illness:    Jacob Waters Diebel is a 63 y.o. male with with history of hypertension, polysubstance abuse, and recently diagnosed atrial flutter, who presents for follow-up after hospitalization earlier this month.  He was admitted on 06/20/2026 with left arm and flank numbness and paresthesias.  The symptoms persisted, he [facility] ED and was found to be in atrial flutter with variable block and frequently elevated ventricular heart rates.  He was noted to have elevated blood alcohol level at the time of his presentation.  He was placed on IV diltiazem and oral metoprolol for rate control.  He subsequently converted to sinus rhythm charged on metoprolol tartrate 100 mg twice daily and aspirin 81 mg daily as well as amlodipine 10 mg daily for blood pressure control.  Today, Mr.  Marvis MoellerMiles reports that he is feeling quite well.  He denies chest pain palpitations, lightheadedness, shortness of breath, and edema.  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  He checks his pulse today and feels like it is slow and regular.  He has not been monitoring his blood pressure at home but hopes to begin using his sister's sphygmomanometer in the near future to assess his blood pressure.  The patient does not have symptoms concerning for COVID-19 infection (fever, chills, cough, or new shortness of breath).    Past Medical History:  Diagnosis Date  . Atrial flutter (HCC)   . Hypertension    No past surgical history on file.   Current Meds  Medication Sig  . amLODipine (NORVASC) 10 MG tablet Take 1 tablet (10 mg total) by mouth daily.  Marland Kitchen. aspirin EC 81 MG tablet Take 1 tablet (81 mg total) by mouth daily.  . metoprolol tartrate (LOPRESSOR) 100 MG tablet Take 1 tablet (100 mg total) by mouth 2 (two) times daily.  . [DISCONTINUED] amLODipine (NORVASC) 10 MG tablet Take 1 tablet (10 mg total) by mouth daily.  . [DISCONTINUED] metoprolol tartrate (LOPRESSOR) 100 MG tablet Take 1 tablet (100 mg total) by mouth 2 (two) times daily.     Allergies:   Patient has no known allergies.   Social History   Tobacco Use  . Smoking status: Never Smoker  . Smokeless tobacco: Former NeurosurgeonUser    Types: Chew  Substance Use Topics  . Alcohol  use: Not Currently  . Drug use: Never     Family Hx: The patient's family history includes Cancer in his father and mother.  ROS:   Please see the history of present illness.   All other systems reviewed and are negative.   Prior CV studies:   The following studies were reviewed today:  TTE (06/21/2018): Normal LV size.  LVEF 50-55%.  Normal RV size and function.  No significant valvular abnormality seen, though evaluation was limited by cardiac and suboptimal acoustic windows.   Labs/Other Tests and Data Reviewed:    EKG:  No ECG reviewed.  Recent Labs: 06/20/2018: B Natriuretic Peptide 414.0; Magnesium 2.0 06/21/2018: ALT 22; TSH 0.751 06/22/2018: BUN 16; Creatinine, Ser 1.22; Hemoglobin 11.5; Platelets 265; Potassium 3.9; Sodium 139   Recent Lipid Panel No results found for: CHOL, TRIG, HDL, CHOLHDL, LDLCALC, LDLDIRECT  Wt Readings from Last 3 Encounters:  06/29/18 175 lb (79.4 kg)  06/22/18 177 lb 4 oz (80.4 kg)     Objective:    Vital Signs:  Ht 5\' 5"  (1.651 m)   Wt 175 lb (79.4 kg)   BMI 29.12 kg/m    VITAL SIGNS:  reviewed  ASSESSMENT & PLAN:    Atrial flutter: Patient feels well without palpitations or other worrisome symptoms.  At the time of his discharge, he was in sinus rhythm.  He seems to be tolerating his combination of metoprolol, amlodipine, and aspirin well (CHADSVASC score equals 1).  I will have him continue his current medications with plans to follow-up in our office in 2 months so that we can repeat an EKG to confirm that he remains in sinus rhythm.  I advised him to let us know if he develops any new symptoms.  Hypertension: Mr. Magrath does not check his blood pressure at home, though he hopes to begin doing so in the near future.  In the meantime, we will continue amlodipine and metoprolol, which we will refill today.  COVID-19 Education: The signs and symptoms of COVID-19 were discussed with the patient and how to seek care for testing (follow up with PCP or arrange E-visit).  The importance of social distancing was discussed today.  Time:   Today, I have spent 12 minutes with the patient with telehealth technology discussing the above problems.     Medication Adjustments/Labs and Tests Ordered: Current medicines are reviewed at length with the patient today.  Concerns regarding medicines are outlined above.   Tests Ordered: None.  Medication Changes: Meds ordered this encounter  Medications  . metoprolol tartrate  (LOPRESSOR) 100 MG tablet    Sig: Take 1 tablet (100 mg total) by mouth 2 (two) times daily.    Dispense:  180 tablet    Refill:  1  . amLODipine (NORVASC) 10 MG tablet    Sig: Take 1 tablet (10 mg total) by mouth daily.    Dispense:  90 tablet    Refill:  1    Disposition:  Follow up in 2 month(s)  Signed, Yvonne Kendall, MD  06/29/2018 10:14 AM    [facility]

## 2018-06-27 NOTE — Telephone Encounter (Signed)
Virtual Visit Pre-Appointment Phone Call  Steps For Call:  1. Confirm consent - "In the setting of the current Covid19 crisis, you are scheduled for a (phone or video) visit with your provider on (date) at (time).  Just as we do with many in-office visits, in order for you to participate in this visit, we must obtain consent.  If you'd like, I can send this to your mychart (if signed up) or email for you to review.  Otherwise, I can obtain your verbal consent now.  All virtual visits are billed to your insurance company just like a normal visit would be.  By agreeing to a virtual visit, we'd like you to understand that the technology does not allow for your provider to perform an examination, and thus may limit your provider's ability to fully assess your condition. If your provider identifies any concerns that need to be evaluated in person, we will make arrangements to do so.  Finally, though the technology is pretty good, we cannot assure that it will always work on either your or our end, and in the setting of a video visit, we may have to convert it to a phone-only visit.  In either situation, we cannot ensure that we have a secure connection.  Are you willing to proceed?" STAFF: Did the patient verbally acknowledge consent to telehealth visit? Document YES/NO here: YES  2. Confirm the BEST phone number to call the day of the visit by including in appointment notes  3. Give patient instructions for WebEx/MyChart download to smartphone as below or Doximity/Doxy.me if video visit (depending on what platform provider is using)  4. Advise patient to be prepared with their blood pressure, heart rate, weight, any heart rhythm information, their current medicines, and a piece of paper and pen handy for any instructions they may receive the day of their visit  5. Inform patient they will receive a phone call 15 minutes prior to their appointment time (may be from unknown caller ID) so they should be  prepared to answer  6. Confirm that appointment type is correct in Epic appointment notes (VIDEO vs PHONE)     TELEPHONE CALL NOTE  Jacob Waters has been deemed a candidate for a follow-up tele-health visit to limit community exposure during the Covid-19 pandemic. I spoke with the patient via phone to ensure availability of phone/video source, confirm preferred email & phone number, and discuss instructions and expectations.  I reminded Zailyn Reppucci to be prepared with any vital sign and/or heart rhythm information that could potentially be obtained via home monitoring, at the time of his visit. I reminded Treye Ludington to expect a phone call at the time of his visit if his visit.  Sandre Kitty 06/27/2018 9:17 AM   INSTRUCTIONS FOR DOWNLOADING THE WEBEX APP TO SMARTPHONE  - If Apple, ask patient to go to App Store and type in WebEx in the search bar. Download [employer], the blue/green circle. If Android, go to [employer] and type in [employer] in the search bar. The app is free but as with any other app downloads, their phone may require them to verify saved payment information or Apple/Android password.  - The patient does NOT have to create an account. - On the day of the visit, the assist will walk the patient through joining the meeting with the meeting number/password.  INSTRUCTIONS FOR DOWNLOADING THE MYCHART APP TO SMARTPHONE  - The patient must first make sure to have activated  MyChart and know their login information - If Apple, go to [employer] and type in MyChart in the search bar and download the app. If Android, ask patient to go to [employer] and type in [place] in the search bar and download the app. The app is free but as with any other app downloads, their phone may require them to verify saved payment information or Apple/Android password.  - The patient will need to then log into the app with their MyChart username and password, and select [facility] as their healthcare provider to link the account. When it is time for your visit, go to the MyChart app, find appointments, and click Begin Video Visit. Be sure to Select Allow for your device to access the Microphone and Camera for your visit. You will then be connected, and your provider will be with you shortly.  **If they have any issues connecting, or need assistance please contact MyChart service desk [phone])**  **If using a computer, in order to ensure the best quality for their visit they will need to use either of the following Internet Browsers: [employer], or Google Chrome**  IF USING DOXIMITY or DOXY.ME - The patient will receive a link just prior to their visit, either by text or email (to be determined day of appointment depending on if it's doxy.me or Doximity).     FULL LENGTH CONSENT FOR TELE-HEALTH VISIT   I hereby voluntarily request, consent and authorize [facility] and its employed or contracted physicians, physician assistants, nurse practitioners or other licensed health care professionals (the Practitioner), to provide me with telemedicine health care services (the "Services") as deemed necessary by the treating Practitioner. I acknowledge and consent to receive the Services by the Practitioner via telemedicine. I understand that the telemedicine visit will involve communicating with the Practitioner through live audiovisual communication technology and the disclosure of certain medical information by electronic transmission. I acknowledge that I have been given the opportunity to request an in-person assessment or other available alternative prior to the telemedicine visit and am voluntarily participating in the telemedicine visit.  I understand that I have the right to withhold or withdraw my consent to the use of telemedicine in the course of my care at any time, without affecting my right to future care or treatment, and that the  Practitioner or I may terminate the telemedicine visit at any time. I understand that I have the right to inspect all information obtained and/or recorded in the course of the telemedicine visit and may receive copies of available information for a reasonable fee.  I understand that some of the potential risks of receiving the Services via telemedicine include:  Marland Kitchen Delay or interruption in medical evaluation due to technological equipment failure or disruption; . Information transmitted may not be sufficient (e.g. poor resolution of images) to allow for appropriate medical decision making by the Practitioner; and/or  . In rare instances, security protocols could fail, causing a breach of personal health information.  Furthermore, I acknowledge that it is my responsibility to provide information about my medical history, conditions and care that is complete and accurate to the best of my ability. I acknowledge that Practitioner's advice, recommendations, and/or decision may be based on factors not within their control, such as incomplete or inaccurate data provided by me or distortions of diagnostic images or specimens that may result from electronic transmissions. I understand that the practice of medicine is not an exact science and that Practitioner  makes no warranties or guarantees regarding treatment outcomes. I acknowledge that I will receive a copy of this consent concurrently upon execution via email to the email address I last provided but may also request a printed copy by calling the office of [facility].    I understand that my insurance will be billed for this visit.   I have read or had this consent read to me. . I understand the contents of this consent, which adequately explains the benefits and risks of the Services being provided via telemedicine.  . I have been provided ample opportunity to ask questions regarding this consent and the Services and have had my questions answered to my  satisfaction. . I give my informed consent for the services to be provided through the use of telemedicine in my medical care  By participating in this telemedicine visit I agree to the above.

## 2018-06-27 NOTE — Discharge Summary (Signed)
[facility] - [place] [facility]   PATIENT NAME: Jacob Waters    MR#:  [id number]  DATE OF BIRTH:  06/25/1955  DATE OF ADMISSION:  06/20/2018 ADMITTING PHYSICIAN: Milagros LollSrikar Kuper Rennels, MD  DATE OF DISCHARGE: 06/22/2018  2:23 PM  PRIMARY CARE PHYSICIAN: Patient, No Pcp Per   ADMISSION DIAGNOSIS:  Numbness [R20.0] Rapid heart rate [R00.0] Elevated blood alcohol level, blood alcohol level not specified [R78.0]  DISCHARGE DIAGNOSIS:  Active Problems:   Atrial flutter (HCC)   Numbness   Polysubstance abuse (HCC)   SECONDARY DIAGNOSIS:   Past Medical History:  Diagnosis Date  . Hypertension      ADMITTING HISTORY  HISTORY OF PRESENT ILLNESS:  Jacob Waters  is a 63 y.o. male with a known history of alcohol abuse, hypertension not on any medications at home presents to the emergency room complaining of some left arm and chest numbness.  Patient is poor historian.  His alcohol level is 340.  He is unable to tell me if he feels heaviness of his left chest and left arm or numbness.  No weakness.  He had a CT scan of the head which was normal.  Chest x-ray looks clear.  Patient was found to have atrial flutter with heart rate in the 160s.  Patient is being admitted. Patient is extremely anxious also tearful at times.  His urine drug screen is negative.  No prior history of MI or CVA.   HOSPITAL COURSE:   *Atrial flutter, new onsetwith rapid ventricular rate in the 160s.  TSH Normal  Started on Cardizem drip.  On metoprolol 50 mg twice daily. Consulted cardiology.    Discussed with Dr. Mariah MillingGollan on day of discharge Echocardiogram ordered and normal ejection fraction Likely due to alcohol withdrawal Patient discharged home on metoprolol 100 mg twice daily and aspirin 81 mg daily. Will need follow-up with cardiology. In normal sinus rhythm on day of discharge.  *Left arm and chest numbness versus heaviness.  Recently stroke was considered.  Patient refused MRI of the  brain.  He has had chronic intermittent symptoms which gets worse with lawnmowing.  Likely has arthritis and neuropathy.  *Alcohol abuse.  CIWA protocol.  Not in withdrawals by day of discharge  *Hypertension. Started on  metoprolol.  *DVT prophylaxis with Lovenox  Discharged home in stable condition.  CONSULTS OBTAINED:  Treatment Team:  [facility], [facility], MD End, Cristal Deerhristopher, MD  DRUG ALLERGIES:  Not on File  DISCHARGE MEDICATIONS:   Allergies as of 06/22/2018   Not on File     Medication List    TAKE these medications   amLODipine 10 MG tablet Commonly known as:  NORVASC Take 1 tablet (10 mg total) by mouth daily.   aspirin EC 81 MG tablet Take 1 tablet (81 mg total) by mouth daily.   metoprolol tartrate 100 MG tablet Commonly known as:  LOPRESSOR Take 1 tablet (100 mg total) by mouth 2 (two) times daily.       Today   VITAL SIGNS:  Blood pressure (!) 218/121, pulse 93, temperature 98.9 F (37.2 C), temperature source Axillary, resp. rate (!) 23, height 5\' 5"  (1.651 m), weight 80.4 kg, SpO2 100 %.  I/O:  No intake or output data in the 24 hours ending 06/27/18 1319  PHYSICAL EXAMINATION:  Physical Exam  GENERAL:  63 y.o.-year-old patient lying in the bed with no acute distress.  LUNGS: Normal breath sounds bilaterally, no wheezing, rales,rhonchi or crepitation. No use of accessory muscles of  respiration.  CARDIOVASCULAR: S1, S2 normal. No murmurs, rubs, or gallops.  ABDOMEN: Soft, non-tender, non-distended. Bowel sounds present. No organomegaly or mass.  NEUROLOGIC: Moves all 4 extremities. PSYCHIATRIC: The patient is alert and oriented x 3.  SKIN: No obvious rash, lesion, or ulcer.   DATA REVIEW:   CBC Recent Labs  Lab 06/22/18 0051  WBC 5.9  HGB 11.5*  HCT 34.5*  PLT 265    Chemistries  Recent Labs  Lab 06/20/18 1825 06/21/18 0415 06/22/18 0051  NA 135 140 139  K 4.2 4.2 3.9  CL 95* 103 103  CO2 22 21* 26  GLUCOSE 125* 84  101*  BUN 12 12 16   CREATININE 1.08 1.05 1.22  CALCIUM 8.6* 8.2* 8.8*  MG 2.0  --   --   AST 51* 38  --   ALT 27 22  --   ALKPHOS 78 68  --   BILITOT 0.7 0.8  --     Cardiac Enzymes Recent Labs  Lab 06/20/18 1825  TROPONINI <0.03    Microbiology Results  Results for orders placed or performed during the hospital encounter of 06/20/18  MRSA PCR Screening     Status: None   Collection Time: 06/20/18 10:39 PM  Result Value Ref Range Status   MRSA by PCR NEGATIVE NEGATIVE Final    Comment:        The GeneXpert MRSA Assay (FDA approved for NASAL specimens only), is one component of a comprehensive MRSA colonization surveillance program. It is not intended to diagnose MRSA infection nor to guide or monitor treatment for MRSA infections. Performed at [facility], [address] [place], [place] [address]     RADIOLOGY:  No results found.  Follow up with PCP in 1 week.  Management plans discussed with the patient, family and they are in agreement.  CODE STATUS:  Code Status History    Date Active Date Inactive Code Status Order ID Comments User Context   06/20/2018 2004 06/22/2018 1728 Full Code [id number]  Milagros Loll, MD ED      TOTAL TIME TAKING CARE OF THIS PATIENT ON DAY OF DISCHARGE: more than 30 minutes.   Molinda Bailiff Fabiano Ginley M.D on 06/27/2018 at 1:19 PM  Between 7am to 6pm - Pager - [phone]  After 6pm go to [email or web address] - password [email or web address]  [facility] [facility]  Office  [phone]  CC: Primary care physician; Patient, No Pcp Per  Note: This dictation was prepared with Dragon dictation along with smaller phrase technology. Any transcriptional errors that result from this process are unintentional.

## 2018-06-29 ENCOUNTER — Other Ambulatory Visit: Payer: Self-pay

## 2018-06-29 ENCOUNTER — Telehealth (INDEPENDENT_AMBULATORY_CARE_PROVIDER_SITE_OTHER): Payer: Self-pay | Admitting: Internal Medicine

## 2018-06-29 ENCOUNTER — Encounter: Payer: Self-pay | Admitting: Internal Medicine

## 2018-06-29 VITALS — Ht 65.0 in | Wt 175.0 lb

## 2018-06-29 DIAGNOSIS — I10 Essential (primary) hypertension: Secondary | ICD-10-CM | POA: Insufficient documentation

## 2018-06-29 DIAGNOSIS — I4892 Unspecified atrial flutter: Secondary | ICD-10-CM

## 2018-06-29 MED ORDER — AMLODIPINE BESYLATE 10 MG PO TABS: 10.0000 mg | ORAL_TABLET | Freq: Every day | ORAL | 1 refills | Status: DC

## 2018-06-29 MED ORDER — METOPROLOL TARTRATE 100 MG PO TABS: 100.0000 mg | ORAL_TABLET | Freq: Two times a day (BID) | ORAL | 1 refills | Status: DC

## 2018-06-29 NOTE — Patient Instructions (Signed)
Medication Instructions:  Your physician recommends that you continue on your current medications as directed. Please refer to the Current Medication list given to you today.  If you need a refill on your cardiac medications before your next appointment, please call your pharmacy.   Lab work: none If you have labs (blood work) drawn today and your tests are completely normal, you will receive your results only by: Marland Kitchen MyChart Message (if you have MyChart) OR . A paper copy in the mail If you have any lab test that is abnormal or we need to change your treatment, we will call you to review the results.  Testing/Procedures: none  Follow-Up: At [facility], you and your health needs are our priority.  As part of our continuing mission to provide you with exceptional heart care, we have created designated Provider Care Teams.  These Care Teams include your primary Cardiologist (physician) and Advanced Practice Providers (APPs -  Physician Assistants and Nurse Practitioners) who all work together to provide you with the care you need, when you need it. You will need a follow up appointment in 2 months in office.  Please call our office 2 months in advance to schedule this appointment.  You may see Yvonne Kendall, MD or one of the following Advanced Practice Providers on your designated Care Team:   Nicolasa Ducking, NP Eula Listen, PA-C . Marisue Ivan, PA-C

## 2018-07-20 ENCOUNTER — Other Ambulatory Visit: Payer: Self-pay | Admitting: Internal Medicine

## 2018-07-20 MED ORDER — METOPROLOL TARTRATE 100 MG PO TABS: 100.0000 mg | ORAL_TABLET | Freq: Two times a day (BID) | ORAL | 1 refills | Status: DC

## 2018-07-20 MED ORDER — AMLODIPINE BESYLATE 10 MG PO TABS: 10.0000 mg | ORAL_TABLET | Freq: Every day | ORAL | 1 refills | Status: DC

## 2018-07-20 NOTE — Telephone Encounter (Signed)
Requested Prescriptions   Signed Prescriptions Disp Refills  . metoprolol tartrate (LOPRESSOR) 100 MG tablet 180 tablet 1    Sig: Take 1 tablet (100 mg total) by mouth 2 (two) times daily.    Authorizing Provider: END, CHRISTOPHER    Ordering User: NEWCOMER MCCLAIN, BRANDY L  . amLODipine (NORVASC) 10 MG tablet 90 tablet 1    Sig: Take 1 tablet (10 mg total) by mouth daily.    Authorizing Provider: END, CHRISTOPHER    Ordering User: Thayer Headings, BRANDY L

## 2018-07-20 NOTE — Telephone Encounter (Signed)
°*  STAT* If patient is at the pharmacy, call can be transferred to refill team.   1. Which medications need to be refilled? (please list name of each medication and dose if known)    Amlodipine 10 mg po q d    Metoprolol 100 mg po BID      2. Which pharmacy/location (including street and city if local pharmacy) is medication to be sent to?    [facility] [address]   3. Do they need a 30 day or 90 day supply?  90

## 2018-08-17 ENCOUNTER — Telehealth: Payer: Self-pay | Admitting: *Deleted

## 2018-08-17 NOTE — Telephone Encounter (Signed)

## 2018-08-19 ENCOUNTER — Other Ambulatory Visit: Payer: Self-pay

## 2018-08-19 ENCOUNTER — Encounter: Payer: Self-pay | Admitting: Internal Medicine

## 2018-08-19 ENCOUNTER — Ambulatory Visit (INDEPENDENT_AMBULATORY_CARE_PROVIDER_SITE_OTHER): Payer: Self-pay | Admitting: Cardiovascular Disease

## 2018-08-19 VITALS — BP 140/70 | HR 49 | Ht 65.5 in | Wt 178.0 lb

## 2018-08-19 DIAGNOSIS — I4892 Unspecified atrial flutter: Secondary | ICD-10-CM

## 2018-08-19 DIAGNOSIS — F1910 Other psychoactive substance abuse, uncomplicated: Secondary | ICD-10-CM

## 2018-08-19 DIAGNOSIS — I10 Essential (primary) hypertension: Secondary | ICD-10-CM

## 2018-08-19 MED ORDER — METOPROLOL SUCCINATE ER 100 MG PO TB24: 100.0000 mg | ORAL_TABLET | Freq: Every day | ORAL | 3 refills | Status: DC

## 2018-08-19 NOTE — Patient Instructions (Addendum)
Medication Instructions:  Your physician has recommended you make the following change in your medication:  1. STOP Metoprolol tartrate 2. START Metoprolol succinate 100 mg once daily 3. STAY on Amlodipine 10 mg daily 4. STAY on Aspirin 81 mg once daily    If you need a refill on your cardiac medications before your next appointment, please call your pharmacy.    Lab work: No new labs needed   If you have labs (blood work) drawn today and your tests are completely normal, you will receive your results only by: Marland Kitchen MyChart Message (if you have MyChart) OR . A paper copy in the mail If you have any lab test that is abnormal or we need to change your treatment, we will call you to review the results.   Testing/Procedures: No new testing needed   Follow-Up: At [facility], you and your health needs are our priority.  As part of our continuing mission to provide you with exceptional heart care, we have created designated Provider Care Teams.  These Care Teams include your primary Cardiologist (physician) and Advanced Practice Providers (APPs -  Physician Assistants and Nurse Practitioners) who all work together to provide you with the care you need, when you need it.  . You will need a follow up appointment in 6 months with Dr. Saunders Revel .   Please call our office 2 months in advance to schedule this appointment.    . Providers on your designated Care Team:   . Murray Hodgkins, NP . Christell Faith, PA-C . Marrianne Mood, PA-C  Any Other Special Instructions Will Be Listed Below (If Applicable).  For educational health videos Log in to : [email or web address] Or : [email or web address], password : [email or web address]

## 2018-08-19 NOTE — Progress Notes (Signed)
Cardiology Office Note  Date:  08/19/2018   ID:  Dorn Hartshorne, DOB 1955/08/28, MRN [id number]  PCP:  Patient, No Pcp Per   Chief Complaint  Patient presents with  . other    2 month f/u left arm tightness. Meds reviewed verbally with pt.    HPI:  Travante Knee is a 63 y.o. male with with history of  hypertension,  polysubstance abuse/ETOH, and   diagnosed atrial flutter April 2020 after heavy alcohol use (3 bottles of wine),  CHADSVASC score is 1 (hypertension), who presents for follow-up of his atrial flutter.  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  The symptoms persisted, he presented to the [facility] ED and was found to be in atrial flutter with variable block and frequently elevated ventricular heart rates.  He was noted to have elevated blood alcohol level at the time of his presentation.  He was placed on IV diltiazem and oral metoprolol for rate control.  He subsequently converted to sinus rhythm   The patient does not have symptoms concerning for COVID-19 infection (fever, chills, cough, or new shortness of breath).    PMH:   has a past medical history of Atrial flutter ([place])  and Hypertension.  PSH:   History reviewed. No pertinent surgical history.  Current Outpatient Medications  Medication Sig Dispense Refill  . amLODipine (NORVASC) 10 MG tablet Take 1 tablet (10 mg total) by mouth daily. 90 tablet 1  . aspirin EC 81 MG tablet Take 1 tablet (81 mg total) by mouth daily. 30 tablet 0  . metoprolol tartrate (LOPRESSOR) 100 MG tablet Take 1 tablet (100 mg total) by mouth 2 (two) times daily. 180 tablet 1   No current facility-administered medications for this visit.      Allergies:   Patient has no known allergies.   Social History:  The patient  reports that he has never smoked. He quit smokeless tobacco use about 3 months ago.  His smokeless tobacco use included chew. He reports previous alcohol use. He reports that he does not use drugs.   Family History:   family history includes Cancer in his father and mother.    Review of Systems: Review of Systems  Constitutional: Negative.   HENT: Negative.   Respiratory: Negative.   Cardiovascular: Negative.   Gastrointestinal: Negative.   Musculoskeletal: Negative.   Neurological: Negative.   Psychiatric/Behavioral: Negative.   All other systems reviewed and are negative.    PHYSICAL EXAM: VS:  BP 140/70   Pulse (!) 49   Ht 5' 5.5" (1.664 m)   Wt 178 lb (80.7 kg)   BMI 29.17 kg/m  , BMI  Body mass index is 29.17 kg/m.  Initial blood pressure on arrival 160 systolic after walking through the hospital GEN: Well nourished, well developed, in no acute distress  HEENT: normal  Neck: no JVD, carotid bruits, or masses Cardiac: RRR; no murmurs, rubs, or gallops,no edema  Respiratory:  clear to auscultation bilaterally, normal work of breathing GI: soft, nontender, nondistended, + BS MS: no deformity or atrophy  Skin: warm and dry, no rash Neuro:  Strength and sensation are intact Psych: euthymic mood, full affect   Recent Labs: 06/20/2018: B Natriuretic Peptide 414.0; Magnesium 2.0 06/21/2018: ALT  22; TSH 0.751 06/22/2018: BUN 16; Creatinine, Ser 1.22; Hemoglobin 11.5; Platelets 265; Potassium 3.9; Sodium 139    Lipid Panel No results found for: CHOL, HDL, LDLCALC, TRIG    Wt Readings from Last 3 Encounters:  08/19/18 178 lb (80.7 kg)  06/29/18 175 lb (79.4 kg)  06/22/18 177 lb 4 oz (80.4 kg)      ASSESSMENT AND PLAN:  Atrial flutter, unspecified type (HCC) - Plan: EKG 12-Lead  Maintaining normal sinus rhythm He is only taking metoprolol tartrate once a day Asymptomatic bradycardia on today's visit We will change him to metoprolol succinate 100 mg daily for ease of use  Alcohol abuse Recommended cessation  Essential hypertension Medication change as above Continue metoprolol succinate with amlodipine daily    Disposition:   F/U  6 months with Dr. Okey DupreEnd   Orders Placed This Encounter  Procedures  . EKG 12-Lead     Signed, Dossie Arbourim Matilda Fleig, M.D., Ph.D. 08/19/2018  [facility] [place] [phone]

## 2019-11-11 ENCOUNTER — Encounter: Payer: Self-pay | Admitting: Psychiatry

## 2019-11-11 ENCOUNTER — Other Ambulatory Visit: Payer: Self-pay

## 2019-11-11 ENCOUNTER — Inpatient Hospital Stay: Payer: Medicare Other

## 2019-11-11 ENCOUNTER — Emergency Department: Payer: Medicare Other

## 2019-11-11 ENCOUNTER — Inpatient Hospital Stay
Admission: EM | Admit: 2019-11-11 | Discharge: 2019-11-14 | DRG: 683 | Disposition: A | Discharge status: Home / Self Care | Payer: Medicare Other | Attending: Family Medicine | Admitting: Family Medicine

## 2019-11-11 DIAGNOSIS — F10239 Alcohol dependence with withdrawal, unspecified: Secondary | ICD-10-CM | POA: Diagnosis not present

## 2019-11-11 DIAGNOSIS — Z20822 Contact with and (suspected) exposure to covid-19: Secondary | ICD-10-CM | POA: Diagnosis present

## 2019-11-11 DIAGNOSIS — R112 Nausea with vomiting, unspecified: Secondary | ICD-10-CM | POA: Insufficient documentation

## 2019-11-11 DIAGNOSIS — I10 Essential (primary) hypertension: Secondary | ICD-10-CM | POA: Diagnosis present

## 2019-11-11 DIAGNOSIS — N1831 Chronic kidney disease, stage 3a: Secondary | ICD-10-CM | POA: Diagnosis present

## 2019-11-11 DIAGNOSIS — Z87891 Personal history of nicotine dependence: Secondary | ICD-10-CM

## 2019-11-11 DIAGNOSIS — I4891 Unspecified atrial fibrillation: Secondary | ICD-10-CM | POA: Diagnosis present

## 2019-11-11 DIAGNOSIS — Z8249 Family history of ischemic heart disease and other diseases of the circulatory system: Secondary | ICD-10-CM | POA: Diagnosis not present

## 2019-11-11 DIAGNOSIS — F10139 Alcohol abuse with withdrawal, unspecified: Secondary | ICD-10-CM | POA: Diagnosis present

## 2019-11-11 DIAGNOSIS — I471 Supraventricular tachycardia: Secondary | ICD-10-CM | POA: Diagnosis present

## 2019-11-11 DIAGNOSIS — I4892 Unspecified atrial flutter: Secondary | ICD-10-CM | POA: Diagnosis present

## 2019-11-11 DIAGNOSIS — Z7982 Long term (current) use of aspirin: Secondary | ICD-10-CM | POA: Diagnosis not present

## 2019-11-11 DIAGNOSIS — IMO0002 Reserved for concepts with insufficient information to code with codable children: Secondary | ICD-10-CM | POA: Diagnosis present

## 2019-11-11 DIAGNOSIS — Z79899 Other long term (current) drug therapy: Secondary | ICD-10-CM

## 2019-11-11 DIAGNOSIS — I129 Hypertensive chronic kidney disease with stage 1 through stage 4 chronic kidney disease, or unspecified chronic kidney disease: Secondary | ICD-10-CM | POA: Diagnosis not present

## 2019-11-11 DIAGNOSIS — F10939 Alcohol use, unspecified with withdrawal, unspecified: Secondary | ICD-10-CM | POA: Diagnosis present

## 2019-11-11 DIAGNOSIS — E876 Hypokalemia: Secondary | ICD-10-CM | POA: Diagnosis present

## 2019-11-11 DIAGNOSIS — F10230 Alcohol dependence with withdrawal, uncomplicated: Secondary | ICD-10-CM

## 2019-11-11 HISTORY — DX: Alcohol abuse, uncomplicated: F10.10

## 2019-11-11 LAB — COMPREHENSIVE METABOLIC PANEL
ALT: 33 U/L (ref 0–44)
AST: 64 U/L — ABNORMAL HIGH (ref 15–41)
Albumin: 4.7 g/dL (ref 3.5–5.0)
Alkaline Phosphatase: 74 U/L (ref 38–126)
Anion gap: 8 (ref 5–15)
BUN: 13 mg/dL (ref 8–23)
CO2: 24 mmol/L (ref 22–32)
Calcium: 9.6 mg/dL (ref 8.9–10.3)
Chloride: 107 mmol/L (ref 98–111)
Creatinine, Ser: 1.28 mg/dL — ABNORMAL HIGH (ref 0.61–1.24)
GFR calc Af Amer: >60 mL/min (ref >60)
GFR calc non Af Amer: 58 mL/min — ABNORMAL LOW (ref >60)
Glucose, Bld: 135 mg/dL — ABNORMAL HIGH (ref 70–99)
Potassium: 4.4 mmol/L (ref 3.5–5.1)
Sodium: 139 mmol/L (ref 135–145)
Total Bilirubin: 1.2 mg/dL (ref 0.3–1.2)
Total Protein: 8.2 g/dL — ABNORMAL HIGH (ref 6.5–8.1)

## 2019-11-11 LAB — TROPONIN I (HIGH SENSITIVITY)
Troponin I (High Sensitivity): 9 ng/L (ref <18)
Troponin I (High Sensitivity): 16 ng/L (ref <18)

## 2019-11-11 LAB — CBC WITH DIFFERENTIAL/PLATELET
Abs Immature Granulocytes: 0.01 10*3/uL (ref 0.00–0.07)
Basophils Absolute: 0 10*3/uL (ref 0.0–0.1)
Basophils Relative: 1 %
Eosinophils Absolute: 0 10*3/uL (ref 0.0–0.5)
Eosinophils Relative: 0 %
HCT: 37.3 % — ABNORMAL LOW (ref 39.0–52.0)
Hemoglobin: 13.2 g/dL (ref 13.0–17.0)
Immature Granulocytes: 0 %
Lymphocytes Relative: 11 %
Lymphs Abs: 0.7 10*3/uL (ref 0.7–4.0)
MCH: 29.9 pg (ref 26.0–34.0)
MCHC: 35.4 g/dL (ref 30.0–36.0)
MCV: 84.4 fL (ref 80.0–100.0)
Monocytes Absolute: 0.4 10*3/uL (ref 0.1–1.0)
Monocytes Relative: 6 %
Neutro Abs: 5.2 10*3/uL (ref 1.7–7.7)
Neutrophils Relative %: 82 %
Platelets: 257 10*3/uL (ref 150–400)
RBC: 4.42 MIL/uL (ref 4.22–5.81)
RDW: 13.4 % (ref 11.5–15.5)
WBC: 6.2 10*3/uL (ref 4.0–10.5)
nRBC: 0 % (ref 0.0–0.2)

## 2019-11-11 LAB — SARS CORONAVIRUS 2 BY RT PCR (HOSPITAL ORDER, PERFORMED IN ~~LOC~~ HOSPITAL LAB): SARS Coronavirus 2: NEGATIVE

## 2019-11-11 LAB — ETHANOL: Alcohol, Ethyl (B): <10 mg/dL (ref <10)

## 2019-11-11 LAB — MAGNESIUM: Magnesium: 1.6 mg/dL — ABNORMAL LOW (ref 1.7–2.4)

## 2019-11-11 MED ORDER — LORAZEPAM 2 MG/ML IJ SOLN
2.0000 mg | Freq: Once | INTRAMUSCULAR | Status: AC
  Administered 2019-11-11: 2 mg via INTRAVENOUS
  Filled 2019-11-11: qty 1

## 2019-11-11 MED ORDER — AMLODIPINE BESYLATE 10 MG PO TABS
10.0000 mg | ORAL_TABLET | Freq: Every day | ORAL | Status: DC
  Administered 2019-11-12 – 2019-11-14 (×3): 10 mg via ORAL
  Filled 2019-11-11: qty 1
  Filled 2019-11-11 (×2): qty 2

## 2019-11-11 MED ORDER — METOPROLOL SUCCINATE ER 50 MG PO TB24
100.0000 mg | ORAL_TABLET | Freq: Every day | ORAL | Status: DC
  Filled 2019-11-11: qty 2

## 2019-11-11 MED ORDER — FOLIC ACID 1 MG PO TABS
1.0000 mg | ORAL_TABLET | Freq: Every day | ORAL | Status: DC
  Administered 2019-11-11 – 2019-11-14 (×4): 1 mg via ORAL
  Filled 2019-11-11 (×4): qty 1

## 2019-11-11 MED ORDER — MAGNESIUM SULFATE 2 GM/50ML IV SOLN
2.0000 g | Freq: Once | INTRAVENOUS | Status: AC
  Administered 2019-11-11: 2 g via INTRAVENOUS
  Filled 2019-11-11: qty 50

## 2019-11-11 MED ORDER — THIAMINE HCL 100 MG PO TABS
100.0000 mg | ORAL_TABLET | Freq: Once | ORAL | Status: AC
  Administered 2019-11-11: 100 mg via ORAL
  Filled 2019-11-11: qty 1

## 2019-11-11 MED ORDER — ONDANSETRON HCL 4 MG/2ML IJ SOLN: 4.0000 mg | Freq: Four times a day (QID) | INTRAMUSCULAR | Status: DC | PRN

## 2019-11-11 MED ORDER — LACTATED RINGERS IV BOLUS
1000.0000 mL | Freq: Once | INTRAVENOUS | Status: AC
  Administered 2019-11-11: 1000 mL via INTRAVENOUS

## 2019-11-11 MED ORDER — ACETAMINOPHEN 325 MG PO TABS: 650.0000 mg | ORAL_TABLET | Freq: Four times a day (QID) | ORAL | Status: DC | PRN

## 2019-11-11 MED ORDER — THIAMINE HCL 100 MG/ML IJ SOLN: 100.0000 mg | Freq: Every day | INTRAMUSCULAR | Status: DC

## 2019-11-11 MED ORDER — METOPROLOL SUCCINATE ER 50 MG PO TB24
100.0000 mg | ORAL_TABLET | Freq: Every day | ORAL | Status: DC
  Administered 2019-11-11: 100 mg via ORAL
  Filled 2019-11-11: qty 2

## 2019-11-11 MED ORDER — ASPIRIN EC 81 MG PO TBEC
81.0000 mg | DELAYED_RELEASE_TABLET | Freq: Every day | ORAL | Status: DC
  Administered 2019-11-11 – 2019-11-13 (×3): 81 mg via ORAL
  Filled 2019-11-11 (×3): qty 1

## 2019-11-11 MED ORDER — ADULT MULTIVITAMIN W/MINERALS CH
1.0000 | ORAL_TABLET | Freq: Once | ORAL | Status: AC
  Administered 2019-11-11: 1 via ORAL
  Filled 2019-11-11: qty 1

## 2019-11-11 MED ORDER — ADULT MULTIVITAMIN W/MINERALS CH
1.0000 | ORAL_TABLET | Freq: Every day | ORAL | Status: DC
  Administered 2019-11-11 – 2019-11-14 (×4): 1 via ORAL
  Filled 2019-11-11 (×3): qty 1

## 2019-11-11 MED ORDER — CLONIDINE HCL 0.1 MG PO TABS
0.2000 mg | ORAL_TABLET | Freq: Once | ORAL | Status: AC
  Administered 2019-11-11: 0.2 mg via ORAL
  Filled 2019-11-11: qty 2

## 2019-11-11 MED ORDER — SODIUM CHLORIDE 0.9 % IV BOLUS
500.0000 mL | Freq: Once | INTRAVENOUS | Status: AC
  Administered 2019-11-11: 500 mL via INTRAVENOUS

## 2019-11-11 MED ORDER — LORAZEPAM 2 MG/ML IJ SOLN
1.0000 mg | INTRAMUSCULAR | Status: DC | PRN
  Filled 2019-11-11: qty 1

## 2019-11-11 MED ORDER — CHLORDIAZEPOXIDE HCL 25 MG PO CAPS
25.0000 mg | ORAL_CAPSULE | Freq: Once | ORAL | Status: AC
  Administered 2019-11-11: 25 mg via ORAL
  Filled 2019-11-11: qty 1

## 2019-11-11 MED ORDER — AMLODIPINE BESYLATE 5 MG PO TABS
10.0000 mg | ORAL_TABLET | Freq: Once | ORAL | Status: AC
  Administered 2019-11-11: 10 mg via ORAL
  Filled 2019-11-11: qty 2

## 2019-11-11 MED ORDER — ACETAMINOPHEN 650 MG RE SUPP: 650.0000 mg | Freq: Four times a day (QID) | RECTAL | Status: DC | PRN

## 2019-11-11 MED ORDER — THIAMINE HCL 100 MG PO TABS
100.0000 mg | ORAL_TABLET | Freq: Every day | ORAL | Status: DC
  Administered 2019-11-11 – 2019-11-14 (×4): 100 mg via ORAL
  Filled 2019-11-11 (×3): qty 1

## 2019-11-11 MED ORDER — LABETALOL HCL 5 MG/ML IV SOLN
10.0000 mg | INTRAVENOUS | Status: DC | PRN
  Filled 2019-11-11: qty 4

## 2019-11-11 MED ORDER — LORAZEPAM 1 MG PO TABS: 1.0000 mg | ORAL_TABLET | ORAL | Status: DC | PRN

## 2019-11-11 MED ORDER — PANTOPRAZOLE SODIUM 40 MG IV SOLR
40.0000 mg | Freq: Two times a day (BID) | INTRAVENOUS | Status: DC
  Administered 2019-11-11 – 2019-11-14 (×6): 40 mg via INTRAVENOUS
  Filled 2019-11-11 (×6): qty 40

## 2019-11-11 NOTE — ED Triage Notes (Signed)
Pt arrives via ems from home, pt has been on a drinking binge recently and drank 24 cans of beer last pm. Pt states that he has vomited and is nauseated

## 2019-11-11 NOTE — ED Notes (Signed)
Pt given 3 x cups of water

## 2019-11-11 NOTE — H&P (Signed)
[facility]- [place] [facility]   PATIENT NAME: Jacob Waters    MR#:  [id number]  DATE OF BIRTH:  Feb 03, 1956  DATE OF ADMISSION:  11/11/2019  PRIMARY CARE PHYSICIAN: Patient, No Pcp Per   REQUESTING/REFERRING PHYSICIAN: Dr Delton Prairie  CHIEF COMPLAINT:   Chief Complaint  Patient presents with  . Shortness of Breath    HISTORY OF PRESENT ILLNESS:  Eduar Kumpf  is a 64 y.o. male with a known history of alcohol abuse and atrial flutter presents to the hospital with shortness of breath.  He was found to have a very accelerated blood pressure and fast heart rate.  He was given IV fluids and ordered magnesium.  He is feeling a little bit better now.  Heart rate converted back to normal sinus rhythm.  Patient states he has gone back to drinking.  PAST MEDICAL HISTORY:   Past Medical History:  Diagnosis Date  . Alcohol abuse   . Atrial flutter (HCC)   . Hypertension     PAST SURGICAL HISTORY:   Past Surgical History:  Procedure Laterality Date  . NO PAST SURGERIES      SOCIAL HISTORY:   Social History   Tobacco Use  . Smoking status: Never Smoker  . Smokeless tobacco: Former Neurosurgeon    Types: Chew  Substance Use Topics  . Alcohol use: Yes    Alcohol/week: 24.0 standard drinks    Types: 24 Cans of beer per week    FAMILY HISTORY:   Family History  Problem Relation Age of Onset  . Cancer Mother   . Hypertension Mother   . Cancer Father   . Hypertension Father     DRUG ALLERGIES:  No Known Allergies  REVIEW OF SYSTEMS:  CONSTITUTIONAL: No fever, fatigue or weakness.  EYES: No blurred or double vision.  EARS, NOSE, AND THROAT: No tinnitus or ear pain. No sore throat RESPIRATORY: No cough, shortness of breath, wheezing or hemoptysis.  CARDIOVASCULAR: No chest pain.  GASTROINTESTINAL: No nausea, vomiting, diarrhea or abdominal pain. No blood in bowel movements GENITOURINARY: No dysuria, hematuria.  ENDOCRINE: No polyuria, nocturia,   HEMATOLOGY: No anemia, easy bruising or bleeding SKIN: No rash or lesion. MUSCULOSKELETAL: Some muscle pain. NEUROLOGIC: No tingling, numbness, weakness.  PSYCHIATRY: No anxiety or depression.   MEDICATIONS AT HOME:   Prior to Admission medications   Medication Sig Start Date End Date Taking? Authorizing Provider  amLODipine (NORVASC) 10 MG tablet Take 1 tablet (10 mg total) by mouth daily. 07/20/18   End, Cristal Deer, MD  aspirin EC 81 MG tablet Take 1 tablet (81 mg total) by mouth daily. 06/22/18   Milagros Loll, MD  metoprolol succinate (TOPROL-XL) 100 MG 24 hr tablet Take 1 tablet (100 mg total) by mouth daily. Take with or immediately following a meal. 08/19/18 11/17/18  Gollan, Tollie Pizza, MD      VITAL SIGNS:  Blood pressure (!) 162/131, pulse 83, temperature 97.9 F (36.6 C), temperature source Oral, resp. rate (!) 21, height 5\' 5"  (1.651 m), weight 80.7 kg, SpO2 99 %.  PHYSICAL EXAMINATION:  GENERAL:  64 y.o.-year-old patient lying in the bed with no acute distress.  EYES: Pupils equal, round, reactive to light and accommodation. No scleral icterus. Extraocular muscles intact.  HEENT: Head atraumatic, normocephalic. Oropharynx and nasopharynx clear.   NECK: no Jvd LUNGS: Normal breath sounds bilaterally, no wheezing, rales,rhonchi or crepitation. No use of accessory muscles of respiration.  CARDIOVASCULAR: S1, S2 normal. No murmurs, rubs, or gallops.  ABDOMEN: Soft, nontender, nondistended. Bowel sounds present.  EXTREMITIES: No pedal edema.  NEUROLOGIC: Cranial nerves II through XII are intact. Muscle strength 5/5 in all extremities. Sensation intact. Gait not checked.  PSYCHIATRIC: The patient is alert and oriented x 3.  SKIN: No rash, lesion, or ulcer.   LABORATORY PANEL:   CBC Recent Labs  Lab 11/11/19 1455  WBC 6.2  HGB 13.2  HCT 37.3*  PLT 257    ------------------------------------------------------------------------------------------------------------------  Chemistries  Recent Labs  Lab 11/11/19 1455  NA 139  K 4.4  CL 107  CO2 24  GLUCOSE 135*  BUN 13  CREATININE 1.28*  CALCIUM 9.6  MG 1.6*  AST 64*  ALT 33  ALKPHOS 74  BILITOT 1.2   ------------------------------------------------------------------------------------------------------------------  Cardiac Enzymes Troponin negative  RADIOLOGY:  DG Chest Portable 1 View  Result Date: 11/11/2019 CLINICAL DATA:  Shortness of breath. EXAM: PORTABLE CHEST 1 VIEW COMPARISON:  June 20, 2018. FINDINGS: The heart size and mediastinal contours are within normal limits. Both lungs are clear. No pneumothorax or pleural effusion is noted. The visualized skeletal structures are unremarkable. IMPRESSION: No active disease. Electronically Signed   By: Lupita Raider M.D.   On: 11/11/2019 15:14    EKG:   First EKG showed sinus tachycardia 143 bpm Next EKG showed SVT at 160 last EKG sinus rhythm 88 bpm  IMPRESSION AND PLAN:   1.  Accelerated hypertension.  Patient ran out of his medications 1 week ago.  Currently not having headache or chest pain.  Started Toprol-XL and Norvasc.  IV labetalol as needed.  Spoke with covering nurse to get a appropriate size cuff to take blood pressure.  Will give 1 dose of clonidine.  CT scan of the head ordered by me.  Currently no stepdown beds so we will try to admit to progressive care unit.  Will monitor blood pressure with oral medications.  If it drip needed can potentially start nitro drip on the progressive care unit. 2.  SVT and Paroxysmal atrial flutter.  Patient has been seen by [facility] cardiology as outpatient.  I consulted [facility] cardiology to see tomorrow.  Patient on aspirin and Toprol-XL.  We will continue.  Heart rate better today after fluid. 3.  Hypomagnesemia IV magnesium given 4.  Alcohol withdrawal.  We will put on alcohol  withdrawal protocol 5.  Chronic kidney disease stage IIIa.  We will give a fluid bolus and see if things improve 6.  Nausea vomiting.  We will give IV Protonix.  Will get a CAT scan of the head just in case central cause.  All the records are reviewed and case discussed with ED provider. Management plans discussed with the patient, family and they are in agreement.  Patient requires inpatient hospital admission with elevated blood pressure which will likely take a few days to bring down into the normal range.  Patient's blood pressure is likely been high for a while so can come down a little bit slower.  CODE STATUS: Full code  TOTAL TIME TAKING CARE OF THIS PATIENT: 50 minutes.    Alford Highland M.D on 11/11/2019 at 5:46 PM  Between 7am to 6pm - Pager - [phone]  After 6pm call admission pager [phone]  [facility]  CC: Primary care physician; Patient, No Pcp Per

## 2019-11-11 NOTE — ED Notes (Signed)
Tammy, sister for pt, on RN phone, given to pt

## 2019-11-11 NOTE — ED Provider Notes (Signed)
[facility] Emergency Department Provider Note   ____________________________________________   First MD Initiated Contact with Patient 11/11/19 1448     (approximate)  I have reviewed the triage vital signs and the nursing notes.   HISTORY  Chief Complaint Shortness of Breath    HPI Jacob Waters is a 64 y.o. male with past medical history of hypertension, atrial fibrillation, and alcohol abuse who presents to the ED for nausea and vomiting.  Patient reports that he has been on a drinking binge recently, drinking about 24 beers daily for least the past week.  When he woke up this morning, he states he felt ill and had a couple episodes of vomiting.  Vomiting resolved but when EMS was called, patient noted to very rapidly between heart rate in the 170s and heart rate in the 80s.  Patient reportedly has been off of his medications for multiple weeks.  He currently states he feels well with no chest pain, shortness of breath, fever, or cough.  He denies any substance abuse beyond alcohol consumption.  His last drink was at 10 PM last night, he denies any history of DTs or seizures.        Past Medical History:  Diagnosis Date  . Atrial flutter (HCC)   . Hypertension     Patient Active Problem List   Diagnosis Date Noted  . Essential hypertension 06/29/2018  . Numbness   . Polysubstance abuse (HCC)   . Atrial flutter (HCC) 06/20/2018    No past surgical history on file.  Prior to Admission medications   Medication Sig Start Date End Date Taking? Authorizing Provider  amLODipine (NORVASC) 10 MG tablet Take 1 tablet (10 mg total) by mouth daily. 07/20/18   End, Cristal Deer, MD  aspirin EC 81 MG tablet Take 1 tablet (81 mg total) by mouth daily. 06/22/18   Milagros Loll, MD  metoprolol succinate (TOPROL-XL) 100 MG 24 hr tablet Take 1 tablet (100 mg total) by mouth daily. Take with or immediately following a meal. 08/19/18 11/17/18  Gollan, Tollie Pizza, MD     Allergies Patient has no known allergies.  Family History  Problem Relation Age of Onset  . Cancer Mother   . Cancer Father     Social History Social History   Tobacco Use  . Smoking status: Never Smoker  . Smokeless tobacco: Former Neurosurgeon    Types: Engineer, drilling  . Vaping Use: Never used  Substance Use Topics  . Alcohol use: Yes    Alcohol/week: 24.0 standard drinks    Types: 24 Cans of beer per week  . Drug use: Never    Review of Systems  Constitutional: No fever/chills Eyes: No visual changes. ENT: No sore throat. Cardiovascular: Denies chest pain. Respiratory: Denies shortness of breath. Gastrointestinal: No abdominal pain.  Positive for nausea and vomiting.  No diarrhea.  No constipation. Genitourinary: Negative for dysuria. Musculoskeletal: Negative for back pain. Skin: Negative for rash. Neurological: Negative for headaches, focal weakness or numbness.  ____________________________________________   PHYSICAL EXAM:  VITAL SIGNS: ED Triage Vitals  Enc Vitals Group     BP 11/11/19 1450 (!) 162/131     Pulse Rate 11/11/19 1450 83     Resp 11/11/19 1450 (!) 21     Temp 11/11/19 1450 97.9 F (36.6 C)     Temp Source 11/11/19 1450 Oral     SpO2 11/11/19 1450 99 %     Weight 11/11/19 1452 177 lb 14.6 oz (  80.7 kg)     Height 11/11/19 1452 5\' 5"  (1.651 m)     Head Circumference --      Peak Flow --      Pain Score 11/11/19 1452 0     Pain Loc --      Pain Edu? --      Excl. in GC? --     Constitutional: Alert and oriented. Eyes: Conjunctivae are normal. Head: Atraumatic. Nose: No congestion/rhinnorhea. Mouth/Throat: Mucous membranes are moist. Neck: Normal ROM Cardiovascular: Tachycardic, regular rhythm. Grossly normal heart sounds. Respiratory: Normal respiratory effort.  No retractions. Lungs CTAB. Gastrointestinal: Soft and nontender. No distention. Genitourinary: deferred Musculoskeletal: No lower extremity tenderness nor  edema. Neurologic:  Normal speech and language. No gross focal neurologic deficits are appreciated.  Tremulous with tongue fasciculations. Skin:  Skin is warm, dry and intact. No rash noted. Psychiatric: Mood and affect are normal. Speech and behavior are normal.  ____________________________________________   LABS (all labs ordered are listed, but only abnormal results are displayed)  Labs Reviewed  CBC WITH DIFFERENTIAL/PLATELET  COMPREHENSIVE METABOLIC PANEL  MAGNESIUM  ETHANOL  TROPONIN I (HIGH SENSITIVITY)   ____________________________________________  EKG  ED ECG REPORT I, 01/11/20, the attending physician, personally viewed and interpreted this ECG.   Date: 11/11/2019  EKG Time: 14:46  Rate: 143  Rhythm: normal sinus rhythm, atrial flutter, PVC's  Axis: Normal  Intervals:none  ST&T Change: None  ED ECG REPORT I, 01/11/2020, the attending physician, personally viewed and interpreted this ECG.   Date: 11/11/2019  EKG Time: 14:49  Rate: 88  Rhythm: normal sinus rhythm, trigeminy  Axis: Normal  Intervals:none  ST&T Change: None    PROCEDURES  Procedure(s) performed (including Critical Care):  .Critical Care Performed by: 01/11/2020, MD Authorized by: Chesley Noon, MD   Critical care provider statement:    Critical care time (minutes):  45   Critical care time was exclusive of:  Separately billable procedures and treating other patients and teaching time   Critical care was necessary to treat or prevent imminent or life-threatening deterioration of the following conditions:  Cardiac failure and toxidrome   Critical care was time spent personally by me on the following activities:  Discussions with consultants, evaluation of patient's response to treatment, examination of patient, ordering and performing treatments and interventions, ordering and review of laboratory studies, ordering and review of radiographic studies, pulse oximetry,  re-evaluation of patient's condition, obtaining history from patient or surrogate and review of old charts   I assumed direction of critical care for this patient from another provider in my specialty: no       ____________________________________________   INITIAL IMPRESSION / ASSESSMENT AND PLAN / ED COURSE       64 year old male with past medical history of hypertension, atrial flutter, and alcohol abuse presents to the ED complaining of nausea and vomiting starting this morning after being on a drinking binge for about the past week.  On arrival, his heart rate seems to very rapidly between the 180s and normal sinus rhythm in the 80s.  I suspect this is related to intermittent atrial flutter, patient also noted to have periods of trigeminy.  No ischemic changes noted on EKG and patient is essentially asymptomatic at this time.  Blood pressure remained stable.  He does appear that he is starting to go through alcohol withdrawal and we will initially treat with fluids as well as IV Ativan.  If his heart rate does not improve with  this treatment, then I would consider giving dose of metoprolol.  We will screen electrolytes as well as alcohol level and troponin.  No evidence of infectious process at this time.  Patient turned over to on provider pending lab results and reassessment of his heart rate.  Anticipate admission for further management of alcohol withdrawal and atrial flutter.      ____________________________________________   FINAL CLINICAL IMPRESSION(S) / ED DIAGNOSES  Final diagnoses:  Atrial flutter, unspecified type (HCC)  Alcohol withdrawal syndrome with complication [facility])     ED Discharge Orders    None       Note:  This document was prepared using Dragon voice recognition software and may include unintentional dictation errors.   Chesley Noon, MD 11/11/19 1536

## 2019-11-12 DIAGNOSIS — I4891 Unspecified atrial fibrillation: Secondary | ICD-10-CM | POA: Diagnosis present

## 2019-11-12 DIAGNOSIS — IMO0002 Reserved for concepts with insufficient information to code with codable children: Secondary | ICD-10-CM | POA: Diagnosis present

## 2019-11-12 DIAGNOSIS — I10 Essential (primary) hypertension: Secondary | ICD-10-CM

## 2019-11-12 DIAGNOSIS — I4892 Unspecified atrial flutter: Secondary | ICD-10-CM

## 2019-11-12 DIAGNOSIS — F1010 Alcohol abuse, uncomplicated: Secondary | ICD-10-CM

## 2019-11-12 DIAGNOSIS — F10239 Alcohol dependence with withdrawal, unspecified: Secondary | ICD-10-CM

## 2019-11-12 LAB — BASIC METABOLIC PANEL
Anion gap: 10 (ref 5–15)
BUN: 14 mg/dL (ref 8–23)
CO2: 28 mmol/L (ref 22–32)
Calcium: 8.9 mg/dL (ref 8.9–10.3)
Chloride: 101 mmol/L (ref 98–111)
Creatinine, Ser: 1.3 mg/dL — ABNORMAL HIGH (ref 0.61–1.24)
GFR calc Af Amer: >60 mL/min (ref >60)
GFR calc non Af Amer: 57 mL/min — ABNORMAL LOW (ref >60)
Glucose, Bld: 106 mg/dL — ABNORMAL HIGH (ref 70–99)
Potassium: 3.3 mmol/L — ABNORMAL LOW (ref 3.5–5.1)
Sodium: 139 mmol/L (ref 135–145)

## 2019-11-12 LAB — CBC
HCT: 33.4 % — ABNORMAL LOW (ref 39.0–52.0)
Hemoglobin: 11.4 g/dL — ABNORMAL LOW (ref 13.0–17.0)
MCH: 30 pg (ref 26.0–34.0)
MCHC: 34.1 g/dL (ref 30.0–36.0)
MCV: 87.9 fL (ref 80.0–100.0)
Platelets: 194 10*3/uL (ref 150–400)
RBC: 3.8 MIL/uL — ABNORMAL LOW (ref 4.22–5.81)
RDW: 13.5 % (ref 11.5–15.5)
WBC: 3.7 10*3/uL — ABNORMAL LOW (ref 4.0–10.5)
nRBC: 0 % (ref 0.0–0.2)

## 2019-11-12 LAB — HIV ANTIBODY (ROUTINE TESTING W REFLEX): HIV Screen 4th Generation wRfx: NONREACTIVE

## 2019-11-12 MED ORDER — APIXABAN 5 MG PO TABS
5.0000 mg | ORAL_TABLET | Freq: Two times a day (BID) | ORAL | Status: DC
  Administered 2019-11-12 – 2019-11-14 (×4): 5 mg via ORAL
  Filled 2019-11-12 (×4): qty 1

## 2019-11-12 MED ORDER — ENOXAPARIN SODIUM 40 MG/0.4ML ~~LOC~~ SOLN
40.0000 mg | SUBCUTANEOUS | Status: DC
  Administered 2019-11-12: 40 mg via SUBCUTANEOUS
  Filled 2019-11-12: qty 0.4

## 2019-11-12 MED ORDER — LACTATED RINGERS IV BOLUS
250.0000 mL | Freq: Once | INTRAVENOUS | Status: AC
  Administered 2019-11-12: 250 mL via INTRAVENOUS

## 2019-11-12 MED ORDER — METOPROLOL SUCCINATE ER 50 MG PO TB24
50.0000 mg | ORAL_TABLET | Freq: Every day | ORAL | Status: DC
  Administered 2019-11-13 – 2019-11-14 (×2): 50 mg via ORAL
  Filled 2019-11-12 (×2): qty 1

## 2019-11-12 MED ORDER — HYDRALAZINE HCL 20 MG/ML IJ SOLN
5.0000 mg | Freq: Four times a day (QID) | INTRAMUSCULAR | Status: DC | PRN
  Administered 2019-11-12 – 2019-11-13 (×2): 5 mg via INTRAVENOUS
  Filled 2019-11-12 (×2): qty 1

## 2019-11-12 NOTE — ED Notes (Signed)
Pt given dinner meal tray

## 2019-11-12 NOTE — ED Notes (Signed)
Pt given tv remote as requested, lights dimmed, pt sleeping att

## 2019-11-12 NOTE — ED Notes (Signed)
Messaged hospitalist to notify of htn, awaiting response.

## 2019-11-12 NOTE — ED Notes (Signed)
Pt given lunch meal tray at this time.  

## 2019-11-12 NOTE — ED Notes (Signed)
Pt given meal tray and 2 cups water as requested but sleeping when food given, tv on, lights dimmed

## 2019-11-12 NOTE — Consult Note (Addendum)
Cardiology Consultation:   Patient ID: Jacob Waters MRN: [id number]; DOB: 31-Dec-1955  Admit date: 11/11/2019 Date of Consult: 11/12/2019  Primary Care Provider: Patient, No Pcp Per [facility] Cardiologist: Yvonne Kendall, MD  [facility] Electrophysiologist:  None    Patient Profile:   Jacob Waters is a 64 y.o. male with a hx of alcohol abuse, atrial flutter, hypertension who is being seen today for the evaluation of atrial flutter at the request of Dr. Renae Gloss.  History of Present Illness:   Jacob Waters is a 64 year old male with history of hypertension, paroxysmal atrial flutter, alcohol abuse who presents to the hospital due to nausea and vomiting.  Patient states running out of his cardiac medications about 2 weeks ago.  Has been drinking about 24 packs of beers daily for the past 2 weeks while being off his medications.  Yesterday, he felt nauseous, vomited, had some diaphoresis.  Denies chest pain, palpitations, shortness of breath.  Denies coughing up blood or hematuria.  Feeling of being ill prompted him to come to the emergency room.  In the ED, EKG showed atrial flutter heart rate 152's.  Patient also noted to be hypertensive.  He was started on BP meds including Toprol-XL.  Over the past 24 hours, his heart rates have been low normal to bradycardic in the 40s.  He states feeling much better compared to day before admission.  Started on CIWA protocol.  Previously not on anticoagulation because of CHA2DS2-VASc of 1.    Past Medical History:  Diagnosis Date  . Alcohol abuse   . Atrial flutter (HCC)   . Hypertension     Past Surgical History:  Procedure Laterality Date  . NO PAST SURGERIES       Home Medications:  Prior to Admission medications   Medication Sig Start Date End Date Taking? Authorizing Provider  amLODipine (NORVASC) 10 MG tablet Take 1 tablet (10 mg total) by mouth daily. Patient not taking: Reported on 11/12/2019 07/20/18   End, Cristal Deer, MD    aspirin EC 81 MG tablet Take 1 tablet (81 mg total) by mouth daily. Patient not taking: Reported on 11/12/2019 06/22/18   Milagros Loll, MD  metoprolol succinate (TOPROL-XL) 100 MG 24 hr tablet Take 1 tablet (100 mg total) by mouth daily. Take with or immediately following a meal. Patient not taking: Reported on 11/11/2019 08/19/18 11/11/19  Antonieta Iba, MD    Inpatient Medications: Scheduled Meds: . amLODipine  10 mg Oral Daily  . apixaban  5 mg Oral BID  . aspirin EC  81 mg Oral Daily  . folic acid  1 mg Oral Daily  . [START ON 11/13/2019] metoprolol succinate  50 mg Oral Q breakfast  . multivitamin with minerals  1 tablet Oral Daily  . pantoprazole (PROTONIX) IV  40 mg Intravenous Q12H  . thiamine  100 mg Oral Daily   Or  . thiamine  100 mg Intravenous Daily   Continuous Infusions:  PRN Meds: acetaminophen **OR** acetaminophen, labetalol, LORazepam **OR** LORazepam, ondansetron (ZOFRAN) IV  Allergies:   No Known Allergies  Social History:   Social History   Socioeconomic History  . Marital status: Single    Spouse name: Not on file  . Number of children: Not on file  . Years of education: Not on file  . Highest education level: Not on file  Occupational History  . Not on file  Tobacco Use  . Smoking status: Never Smoker  . Smokeless tobacco: Former Neurosurgeon  Types: Chew  Vaping Use  . Vaping Use: Never used  Substance and Sexual Activity  . Alcohol use: Yes    Alcohol/week: 24.0 standard drinks    Types: 24 Cans of beer per week  . Drug use: Never  . Sexual activity: Not on file  Other Topics Concern  . Not on file  Social History Narrative  . Not on file   Social Determinants of Health   Financial Resource Strain:   . Difficulty of Paying Living Expenses: Not on file  Food Insecurity:   . Worried About Programme researcher, broadcasting/film/video in the Last Year: Not on file  . Ran Out of Food in the Last Year: Not on file  Transportation Needs:   . Lack of Transportation  (Medical): Not on file  . Lack of Transportation (Non-Medical): Not on file  Physical Activity:   . Days of Exercise per Week: Not on file  . Minutes of Exercise per Session: Not on file  Stress:   . Feeling of Stress : Not on file  Social Connections:   . Frequency of Communication with Friends and Family: Not on file  . Frequency of Social Gatherings with Friends and Family: Not on file  . Attends Religious Services: Not on file  . Active Member of Clubs or Organizations: Not on file  . Attends Banker Meetings: Not on file  . Marital Status: Not on file  Intimate Partner Violence:   . Fear of Current or Ex-Partner: Not on file  . Emotionally Abused: Not on file  . Physically Abused: Not on file  . Sexually Abused: Not on file    Family History:    Family History  Problem Relation Age of Onset  . Cancer Mother   . Hypertension Mother   . Cancer Father   . Hypertension Father      ROS:  Please see the history of present illness.   All other ROS reviewed and negative.     Physical Exam/Data:   Vitals:   11/12/19 0830 11/12/19 0900 11/12/19 0935 11/12/19 1000  BP: (!) 163/92 (!) 165/94 (!) 164/92 (!) 149/75  Pulse: (!) 58 (!) 26 71 (!) 56  Resp: 17 17 20 11   Temp:      TempSrc:      SpO2: 98% 93% 98% 100%  Weight:      Height:        Intake/Output Summary (Last 24 hours) at 11/12/2019 1055 Last data filed at 11/12/2019 1014 Gross per 24 hour  Intake 1786.44 ml  Output 250 ml  Net 1536.44 ml   Last 3 Weights 11/11/2019 08/19/2018 06/29/2018  Weight (lbs) 177 lb 14.6 oz 178 lb 175 lb  Weight (kg) 80.7 kg 80.74 kg 79.379 kg     Body mass index is 29.61 kg/m.  General:  Well nourished, well developed, in no acute distress HEENT: normal Lymph: no adenopathy Neck: no JVD Endocrine:  No thryomegaly Vascular: No carotid bruits; FA pulses 2+ bilaterally without bruits  Cardiac:  normal S1, S2; RRR; no murmur  Lungs:  clear to auscultation bilaterally,  no wheezing, rhonchi or rales  Abd: soft, nontender, no hepatomegaly  Ext: no edema Musculoskeletal:  No deformities, BUE and BLE strength normal and equal Skin: warm and dry  Neuro:  CNs 2-12 intact, no focal abnormalities noted Psych:  Normal affect   EKG:  The EKG was personally reviewed and demonstrates: Atrial flutter, heart rate 152 Telemetry:  Telemetry was personally reviewed  and demonstrates: Sinus rhythm  Relevant CV Studies: Echo 06/2018 1. The left ventricle has low normal systolic function, with an ejection  fraction of 50-55%. The cavity size was normal. Left ventricular diastolic  function could not be evaluated due to nondiagnostic images.  2. Evaluation of LVEF is limited due to tachycardia and poor apical  windows. Consider limited echo once atrial flutter/tachycardia has  resolved.  3. The right ventricle has normal systolic function. The cavity was  normal. There is no increase in right ventricular wall thickness.  4. Left atrial size was not well visualized.  5. The mitral valve was not well visualized. Mild thickening of the  mitral valve leaflet.  6. The aortic valve is tricuspid. Mild thickening of the aortic valve.  Mild calcification of the aortic valve. Aortic valve regurgitation was not  assessed by color flow Doppler.  7. The interatrial septum was not well visualized.   Laboratory Data:  High Sensitivity Troponin:   Recent Labs  Lab 11/11/19 1455 11/11/19 1938  TROPONINIHS 9 16     Chemistry Recent Labs  Lab 11/11/19 1455 11/12/19 0730  NA 139 139  K 4.4 3.3*  CL 107 101  CO2 24 28  GLUCOSE 135* 106*  BUN 13 14  CREATININE 1.28* 1.30*  CALCIUM 9.6 8.9  GFRNONAA 58* 57*  GFRAA >60 >60  ANIONGAP 8 10    Recent Labs  Lab 11/11/19 1455  PROT 8.2*  ALBUMIN 4.7  AST 64*  ALT 33  ALKPHOS 74  BILITOT 1.2   Hematology Recent Labs  Lab 11/11/19 1455 11/12/19 0730  WBC 6.2 3.7*  RBC 4.42 3.80*  HGB 13.2 11.4*  HCT 37.3*  33.4*  MCV 84.4 87.9  MCH 29.9 30.0  MCHC 35.4 34.1  RDW 13.4 13.5  PLT 257 194   BNPNo results for input(s): BNP, PROBNP in the last 168 hours.  DDimer No results for input(s): DDIMER in the last 168 hours.   Radiology/Studies:  CT HEAD WO CONTRAST  Result Date: 11/11/2019 CLINICAL DATA:  Nausea vomiting EXAM: CT HEAD WITHOUT CONTRAST TECHNIQUE: Contiguous axial images were obtained from the base of the skull through the vertex without intravenous contrast. COMPARISON:  None. FINDINGS: Brain: No evidence of acute territorial infarction, hemorrhage, hydrocephalus,extra-axial collection or mass lesion/mass effect. There is dilatation the ventricles and sulci consistent with age-related atrophy. Low-attenuation changes in the deep white matter consistent with small vessel ischemia. Prior lacunar infarct involving the right thalamus Vascular: No hyperdense vessel or unexpected calcification. Skull: The skull is intact. No fracture or focal lesion identified. Sinuses/Orbits: The visualized paranasal sinuses and mastoid air cells are clear. The orbits and globes intact. Other: None IMPRESSION: No acute intracranial abnormality. Findings consistent with mild age related atrophy and chronic small vessel ischemia Electronically Signed   By: Jonna Clark M.D.   On: 11/11/2019 19:22   DG Chest Portable 1 View  Result Date: 11/11/2019 CLINICAL DATA:  Shortness of breath. EXAM: PORTABLE CHEST 1 VIEW COMPARISON:  June 20, 2018. FINDINGS: The heart size and mediastinal contours are within normal limits. Both lungs are clear. No pneumothorax or pleural effusion is noted. The visualized skeletal structures are unremarkable. IMPRESSION: No active disease. Electronically Signed   By: Lupita Raider M.D.   On: 11/11/2019 15:14           Assessment and Plan:   1. Paroxysmal atrial flutter -Currently in sinus rhythm, heart rate controlled -CHA2DS2-VASc of 2 (age, htn) -Decrease Toprol-XL to 69  mg  daily -Start Eliquis 5 mg twice daily -Previous echo showed normal systolic function  2.  Hypertension -Blood pressure improved since starting oral medications. -Continue amlodipine 10 mg daily, Toprol-XL 50 daily.  3.  Alcohol abuse -Cessation advised -Withdrawal protocol as per primary team  Total encounter time more than 110 minutes  Greater than 50% was spent in counseling and coordination of care with the patient   Signed, Debbe OdeaBrian Agbor-Etang, MD  11/12/2019 10:55 AM

## 2019-11-12 NOTE — Progress Notes (Signed)
ANTICOAGULATION CONSULT NOTE  Pharmacy Consult for Enoxaparin Indication: VTE prophylaxis  No Known Allergies  Patient Measurements: Height: 5\' 5"  (165.1 cm) Weight: 80.7 kg (177 lb 14.6 oz) IBW/kg (Calculated) : 61.5  Vital Signs: BP: 150/75 (09/05 0800) Pulse Rate: 51 (09/05 0800)  Labs: Recent Labs    11/11/19 1455 11/11/19 1938 11/12/19 0730  HGB 13.2  --   --   HCT 37.3*  --   --   PLT 257  --   --   CREATININE 1.28*  --  1.30*  TROPONINIHS 9 16  --     Estimated Creatinine Clearance: 55.4 mL/min (A) (by C-G formula based on SCr of 1.3 mg/dL (H)).   Medical History: Past Medical History:  Diagnosis Date  . Alcohol abuse   . Atrial flutter (HCC)   . Hypertension     Medications:  Patient is not on any anticoagulation prior to admission.   Assessment: Pharmacy has been consulted for VTE prophylaxis for a 64yo male with PMH significant for atrial flutter, h/o alcohole abuse, and HTN presenting with SOB. Patient reports he has been on his medications for multiple weeks. Baseline Hgb and Plt wnl.  Goal of Therapy:  Monitor platelets by anticoagulation protocol: Yes   Plan:  Lovenox 40mg  Q24h -CBC per protocol  64yo, PharmD Pharmacy Resident  11/12/2019 8:10 AM

## 2019-11-12 NOTE — Progress Notes (Signed)
PROGRESS NOTE    Patient: Jacob Waters                            PCP: Patient, No Pcp Per                    DOB: April 09, 1955            DOA: 11/11/2019 PJK:[id number]             DOS: 11/12/2019, 7:47 AM   LOS: 1 day   Date of Service: The patient was seen and examined on 11/12/2019  Subjective:   The patient was seen and examined this Am. Stable -no acute distress chest pain Still complaining of some SOB Otherwise no issues overnight .  Brief Narrative:   Per HPI:  Jacob Waters  is a 63 y.o. male with a known history of alcohol abuse and atrial flutter presents to the hospital with shortness of breath.  He was found to have a very accelerated blood pressure and fast heart rate.   He was given IV fluids and ordered magnesium.  He is feeling a little bit better now.   Heart rate converted back to normal sinus rhythm.  Patient states he has gone back to drinking.   Assessment & Plan:   Active Problems:   Alcohol withdrawal (HCC)   Accelerated hypertension   Accelerated hypertension.  -Stable, improved -Out of medication for 1 week -Tolerating oral Norvasc, metoprolol XL which has been restarted -Denies any chest pain headaches - Clonidine, IV labetalol as needed.  -CT of the head obtained reviewed negative for any acute findings, chronic changes    SVT and Paroxysmal atrial flutter. On admission:  First EKG showed sinus tachycardia 143 bpm Next EKG showed SVT at 160 last EKG sinus rhythm 88 bpm  HR this Am 53 pm    -Patient has been seen by [facility] cardiology as outpatient.   -consulted [facility] cardiology.   - we will cont. ASA,  Toprol-XL.     Hypomagnesemia IV magnesium given  Alcohol withdrawal.  We will put on alcohol withdrawal protocol -CIWA protocol -Thiamine folate replacement  Chronic kidney disease stage IIIa.   -Continue IV fluid resuscitation -Monitoring BUN/creatinine    Nausea vomiting -Stable, improving -Continue IV Protonix, as needed  antiemetics -CT of the head was obtained IMPRESSION: No acute intracranial abnormality. Findings consistent with mild age related atrophy and chronic small vessel ischemia  Hypokalemia -Monitoring and repleting IV today  Consultants: Cardiology   ----------------------------------------------------------------------------------------------------------------------------------------------  DVT prophylaxis:  Lovenox SQ Code Status:   Code Status: Full Code Family Communication: No family member present at bedside- attempt will be made to update daily The above findings and plan of care has been discussed with patient (and family )  in detail,  they expressed understanding and agreement of above. -Advance care planning has been discussed.   Admission status:    Status is: Inpatient  Remains inpatient appropriate because:Inpatient level of care appropriate due to severity of illness   Dispo: The patient is from: Home              Anticipated d/c is to: Home              Anticipated d/c date is: 2 days              Patient currently is not medically stable to d/c.        Procedures:  No admission procedures for hospital encounter.     Antimicrobials:  Anti-infectives (From admission, onward)   None       Medication:  . amLODipine  10 mg Oral Daily  . aspirin EC  81 mg Oral Daily  . folic acid  1 mg Oral Daily  . metoprolol succinate  100 mg Oral Daily  . metoprolol succinate  100 mg Oral Q breakfast  . multivitamin with minerals  1 tablet Oral Daily  . pantoprazole (PROTONIX) IV  40 mg Intravenous Q12H  . thiamine  100 mg Oral Daily   Or  . thiamine  100 mg Intravenous Daily    acetaminophen **OR** acetaminophen, labetalol, LORazepam **OR** LORazepam, ondansetron (ZOFRAN) IV   Objective:   Vitals:   11/12/19 0630 11/12/19 0715 11/12/19 0725 11/12/19 0730  BP: (!) 169/90 (!) 169/90  (!) 155/81  Pulse: 68 68 (!) 55 (!) 53  Resp: (!) 22  14 18    Temp:      TempSrc:      SpO2: 95%  99% 99%  Weight:      Height:        Intake/Output Summary (Last 24 hours) at 11/12/2019 0747 Last data filed at 11/12/2019 0719 Gross per 24 hour  Intake 1536.44 ml  Output 250 ml  Net 1286.44 ml   Filed Weights   11/11/19 1452  Weight: 80.7 kg     Examination:   Physical Exam  Constitution:  Alert, cooperative, no distress,  Appears calm and comfortable  Psychiatric: Normal and stable mood and affect, cognition intact,   HEENT: Normocephalic, PERRL, otherwise with in Normal limits  Chest:Chest symmetric Cardio vascular:  S1/S2, RRR, No murmure, No Rubs or Gallops  pulmonary: Clear to auscultation bilaterally, respirations unlabored, negative wheezes / crackles Abdomen: Soft, non-tender, non-distended, bowel sounds,no masses, no organomegaly Muscular skeletal: Limited exam - in bed, able to move all 4 extremities, Normal strength,  Neuro: CNII-XII intact. , normal motor and sensation, reflexes intact  Extremities: No pitting edema lower extremities, +2 pulses  Skin: Dry, warm to touch, negative for any Rashes, No open wounds Wounds: per nursing documentation    ------------------------------------------------------------------------------------------------------------------------------------------    LABs:  CBC Latest Ref Rng & Units 11/11/2019 06/22/2018 06/21/2018  WBC 4.0 - 10.5 K/uL 6.2 5.9 5.9  Hemoglobin 13.0 - 17.0 g/dL 06/23/2018 11.5(L) 12.9(L)  Hematocrit 39 - 52 % 37.3(L) 34.5(L) 38.5(L)  Platelets 150 - 400 K/uL 257 265 327   CMP Latest Ref Rng & Units 11/11/2019 06/22/2018 06/21/2018  Glucose 70 - 99 mg/dL 06/23/2018) 332(R) 84  BUN 8 - 23 mg/dL 13 16 12   Creatinine 0.61 - 1.24 mg/dL 518(A) 4.16(S  Sodium 135 - 145 mmol/L 139 139 140  Potassium 3.5 - 5.1 mmol/L 4.4 3.9 4.2  Chloride 98 - 111 mmol/L 107 103 103  CO2 22 - 32 mmol/L 24 26 21(L)  Calcium 8.9 - 10.3 mg/dL 9.6 0.63) 0.16)  Total Protein 6.5 - 8.1 g/dL 8.2(H) - 7.0   Total Bilirubin 0.3 - 1.2 mg/dL 1.2 - 0.8  Alkaline Phos 38 - 126 U/L 74 - 68  AST 15 - 41 U/L 64(H) - 38  ALT 0 - 44 U/L 33 - 22       Micro Results Recent Results (from the past 240 hour(s))  SARS Coronavirus 2 by RT PCR (hospital order, performed in [facility] hospital lab) Nasopharyngeal Nasopharyngeal Swab     Status: None   Collection Time: 11/11/19  7:38 PM  Specimen: Nasopharyngeal Swab  Result Value Ref Range Status   SARS Coronavirus 2 NEGATIVE NEGATIVE Final    Comment: (NOTE) SARS-CoV-2 target nucleic acids are NOT DETECTED.  The SARS-CoV-2 RNA is generally detectable in upper and lower respiratory specimens during the acute phase of infection. The lowest concentration of SARS-CoV-2 viral copies this assay can detect is 250 copies / mL. A negative result does not preclude SARS-CoV-2 infection and should not be used as the sole basis for treatment or other patient management decisions.  A negative result may occur with improper specimen collection / handling, submission of specimen other than nasopharyngeal swab, presence of viral mutation(s) within the areas targeted by this assay, and inadequate number of viral copies (<250 copies / mL). A negative result must be combined with clinical observations, patient history, and epidemiological information.  Fact Sheet for Patients:   BoilerBrush.com.cy  Fact Sheet for Healthcare Providers: https://pope.com/  This test is not yet approved or  cleared by the [place] FDA and has been authorized for detection and/or diagnosis of SARS-CoV-2 by FDA under an Emergency Use Authorization (EUA).  This EUA will remain in effect (meaning this test can be used) for the duration of the COVID-19 declaration under Section 564(b)(1) of the Act, 21 U.S.C. section 360bbb-3(b)(1), unless the authorization is terminated or revoked sooner.  Performed at [facility], [address] [place], [place] [address]     Radiology Reports CT HEAD WO CONTRAST  Result Date: 11/11/2019 CLINICAL DATA:  Nausea vomiting EXAM: CT HEAD WITHOUT CONTRAST TECHNIQUE: Contiguous axial images were obtained from the base of the skull through the vertex without intravenous contrast. COMPARISON:  None. FINDINGS: Brain: No evidence of acute territorial infarction, hemorrhage, hydrocephalus,extra-axial collection or mass lesion/mass effect. There is dilatation the ventricles and sulci consistent with age-related atrophy. Low-attenuation changes in the deep white matter consistent with small vessel ischemia. Prior lacunar infarct involving the right thalamus Vascular: No hyperdense vessel or unexpected calcification. Skull: The skull is intact. No fracture or focal lesion identified. Sinuses/Orbits: The visualized paranasal sinuses and mastoid air cells are clear. The orbits and globes intact. Other: None IMPRESSION: No acute intracranial abnormality. Findings consistent with mild age related atrophy and chronic small vessel ischemia Electronically Signed   By: Jonna Clark M.D.   On: 11/11/2019 19:22   DG Chest Portable 1 View  Result Date: 11/11/2019 CLINICAL DATA:  Shortness of breath. EXAM: PORTABLE CHEST 1 VIEW COMPARISON:  June 20, 2018. FINDINGS: The heart size and mediastinal contours are within normal limits. Both lungs are clear. No pneumothorax or pleural effusion is noted. The visualized skeletal structures are unremarkable. IMPRESSION: No active disease. Electronically Signed   By: Lupita Raider M.D.   On: 11/11/2019 15:14    SIGNED: Kendell Bane, MD, FACP, FHM. [facility],  Pager (please use [email or web address] to page/text)  If 7PM-7AM, please contact night-coverage [email or web address] 11/12/2019, 7:47 AM

## 2019-11-12 NOTE — ED Notes (Signed)
Attempt to call report, rn not available. 

## 2019-11-13 LAB — BASIC METABOLIC PANEL
Anion gap: 11 (ref 5–15)
BUN: 16 mg/dL (ref 8–23)
CO2: 27 mmol/L (ref 22–32)
Calcium: 9.4 mg/dL (ref 8.9–10.3)
Chloride: 100 mmol/L (ref 98–111)
Creatinine, Ser: 1.36 mg/dL — ABNORMAL HIGH (ref 0.61–1.24)
GFR calc Af Amer: >60 mL/min (ref >60)
GFR calc non Af Amer: 54 mL/min — ABNORMAL LOW (ref >60)
Glucose, Bld: 116 mg/dL — ABNORMAL HIGH (ref 70–99)
Potassium: 3.6 mmol/L (ref 3.5–5.1)
Sodium: 138 mmol/L (ref 135–145)

## 2019-11-13 MED ORDER — PNEUMOCOCCAL VAC POLYVALENT 25 MCG/0.5ML IJ INJ: 0.5000 mL | INJECTION | INTRAMUSCULAR | Status: DC

## 2019-11-13 MED ORDER — HYDRALAZINE HCL 50 MG PO TABS
50.0000 mg | ORAL_TABLET | Freq: Three times a day (TID) | ORAL | Status: DC
  Administered 2019-11-13 – 2019-11-14 (×3): 50 mg via ORAL
  Filled 2019-11-13 (×3): qty 1

## 2019-11-13 MED ORDER — HYDRALAZINE HCL 20 MG/ML IJ SOLN: 10.0000 mg | INTRAMUSCULAR | Status: DC | PRN

## 2019-11-13 NOTE — Progress Notes (Signed)
Progress Note  Patient Name: Jacob Waters Date of Encounter: 11/13/2019  [facility] Cardiologist: Yvonne Kendall, MD   Subjective   Maintaining normal sinus rhythm Reports heavy alcohol, sits around with friends, up to 1 case of beer at a time  Inpatient Medications    Scheduled Meds: . amLODipine  10 mg Oral Daily  . apixaban  5 mg Oral BID  . aspirin EC  81 mg Oral Daily  . folic acid  1 mg Oral Daily  . hydrALAZINE  50 mg Oral Q8H  . metoprolol succinate  50 mg Oral Q breakfast  . multivitamin with minerals  1 tablet Oral Daily  . pantoprazole (PROTONIX) IV  40 mg Intravenous Q12H  . [START ON 11/14/2019] pneumococcal 23 valent vaccine  0.5 mL Intramuscular Tomorrow-1000  . thiamine  100 mg Oral Daily   Or  . thiamine  100 mg Intravenous Daily   Continuous Infusions:  PRN Meds: acetaminophen **OR** acetaminophen, hydrALAZINE, labetalol, LORazepam **OR** LORazepam, ondansetron (ZOFRAN) IV   Vital Signs    Vitals:   11/13/19 0533 11/13/19 0827 11/13/19 0828 11/13/19 1309  BP: (!) 178/85 (!) 182/104 (!) 189/104 (!) 141/83  Pulse:  62 62 70  Resp:  18  18  Temp:    98.5 F (36.9 C)  TempSrc:    Oral  SpO2:   100% 100%  Weight:      Height:        Intake/Output Summary (Last 24 hours) at 11/13/2019 1315 Last data filed at 11/13/2019 0420 Gross per 24 hour  Intake 0 ml  Output 700 ml  Net -700 ml   Last 3 Weights 11/12/2019 11/11/2019 08/19/2018  Weight (lbs) 178 lb 9.2 oz 177 lb 14.6 oz 178 lb  Weight (kg) 81 kg 80.7 kg 80.74 kg      Telemetry    Normal sinus rhythm- Personally Reviewed  ECG    - Personally Reviewed  Physical Exam   GEN: No acute distress.   Neck: No JVD Cardiac: RRR, no murmurs, rubs, or gallops.  Respiratory: Clear to auscultation bilaterally. GI: Soft, nontender, non-distended  MS: No edema; No deformity. Neuro:  Nonfocal  Psych: Normal affect   Labs    High Sensitivity Troponin:   Recent Labs  Lab 11/11/19 1455  11/11/19 1938  TROPONINIHS 9 16      Chemistry Recent Labs  Lab 11/11/19 1455 11/12/19 0730 11/13/19 0404  NA 139 139 138  K 4.4 3.3* 3.6  CL 107 101 100  CO2 24 28 27   GLUCOSE 135* 106* 116*  BUN 13 14 16   CREATININE 1.28* 1.30* 1.36*  CALCIUM 9.6 8.9 9.4  PROT 8.2*  --   --   ALBUMIN 4.7  --   --   AST 64*  --   --   ALT 33  --   --   ALKPHOS 74  --   --   BILITOT 1.2  --   --   GFRNONAA 58* 57* 54*  GFRAA >60 >60 >60  ANIONGAP 8 10 11      Hematology Recent Labs  Lab 11/11/19 1455 11/12/19 0730  WBC 6.2 3.7*  RBC 4.42 3.80*  HGB 13.2 11.4*  HCT 37.3* 33.4*  MCV 84.4 87.9  MCH 29.9 30.0  MCHC 35.4 34.1  RDW 13.4 13.5  PLT 257 194    BNPNo results for input(s): BNP, PROBNP in the last 168 hours.   DDimer No results for input(s): DDIMER in the last 168  hours.   Radiology    CT HEAD WO CONTRAST  Result Date: 11/11/2019 CLINICAL DATA:  Nausea vomiting EXAM: CT HEAD WITHOUT CONTRAST TECHNIQUE: Contiguous axial images were obtained from the base of the skull through the vertex without intravenous contrast. COMPARISON:  None. FINDINGS: Brain: No evidence of acute territorial infarction, hemorrhage, hydrocephalus,extra-axial collection or mass lesion/mass effect. There is dilatation the ventricles and sulci consistent with age-related atrophy. Low-attenuation changes in the deep white matter consistent with small vessel ischemia. Prior lacunar infarct involving the right thalamus Vascular: No hyperdense vessel or unexpected calcification. Skull: The skull is intact. No fracture or focal lesion identified. Sinuses/Orbits: The visualized paranasal sinuses and mastoid air cells are clear. The orbits and globes intact. Other: None IMPRESSION: No acute intracranial abnormality. Findings consistent with mild age related atrophy and chronic small vessel ischemia Electronically Signed   By: Jonna Clark M.D.   On: 11/11/2019 19:22   DG Chest Portable 1 View  Result Date:  11/11/2019 CLINICAL DATA:  Shortness of breath. EXAM: PORTABLE CHEST 1 VIEW COMPARISON:  June 20, 2018. FINDINGS: The heart size and mediastinal contours are within normal limits. Both lungs are clear. No pneumothorax or pleural effusion is noted. The visualized skeletal structures are unremarkable. IMPRESSION: No active disease. Electronically Signed   By: Lupita Raider M.D.   On: 11/11/2019 15:14    Cardiac Studies    Patient Profile     64 y.o. male with history of heavy alcohol abuse, wine and beer, paroxysmal atrial flutter presenting with atrial flutter, nausea, vomiting, diaphoresis, heart rate up to 150s on arrival with hypertension  Assessment & Plan    Paroxysmal atrial flutter Prior history of atrial flutter, similar circumstances in the past with prior hospitalization Arrhythmia in the setting of alcohol abuse Similar episode April 2020, had 3 bottles of wine at the time -Alcohol cessation recommended -Tolerating metoprolol succinate 50 daily -Ideally should be on anticoagulation given CHADS VASC of 2 There has been some medication noncompliance in the past Previously was not taking metoprolol twice daily as directed, was only taking this once a day --- We will consider changing eliquis twice daily to Xarelto 20 daily for this reason  Alcohol abuse Long discussion with him, as on previous hospitalizations, cessation recommended  Essential hypertension On amlodipine, metoprolol succinate 50 daily   Total encounter time more than 25 minutes  Greater than 50% was spent in counseling and coordination of care with the patient   For questions or updates, please contact [facility] Please consult [email or web address] for contact info under        Signed, Julien Nordmann, MD  11/13/2019, 1:15 PM

## 2019-11-13 NOTE — Progress Notes (Signed)
PROGRESS NOTE    Patient: Jacob Waters                            PCP: Patient, No Pcp Per                    DOB: 07/05/1955            DOA: 11/11/2019 ERX:[id number]             DOS: 11/13/2019, 12:10 PM   LOS: 2 days   Date of Service: The patient was seen and examined on 11/13/2019  Subjective:   The patient was seen and examined this morning, remained stable denies any chest pain or shortness of breath Denies any palpitation. Noted in accelerated hypertensive state, denies any headaches visual changes Blood pressure 189/104  Brief Narrative:   Per HPI:  Jacob Waters  is a 64 y.o. male with a known history of alcohol abuse and atrial flutter presents to the hospital with shortness of breath.  He was found to have a very accelerated blood pressure and fast heart rate.   He was given IV fluids and ordered magnesium.  He is feeling a little bit better now.   Heart rate converted back to normal sinus rhythm.  Patient states he has gone back to drinking.   Assessment & Plan:   Active Problems:   Alcohol withdrawal (HCC)   Accelerated hypertension   Flutter-fibrillation (HCC)   Accelerated hypertension.  -Monitoring closely, patient currently on Norvasc, metoprolol Adding hydralazine 50 mg p.o. every 8 hours -As needed labetalol, hydralazine  -On admission reported patient has run out of his meds for 1 week.  -Denies any chest pain headaches  -CT of the head obtained reviewed negative for any acute findings, chronic changes    SVT and Paroxysmal atrial flutter. On admission:  First EKG showed sinus tachycardia 143 bpm Next EKG showed SVT at 160 last EKG sinus rhythm 88 bpm Heart rate has improved to 62,  -Patient has been started on aspirin, Eliquis Remains on Toprol-XL -Appreciate cardiology follow      Hypomagnesemia -was repleted IV  Alcohol withdrawal.   -Monitoring closely no signs of overt DTs, only hypotensive -CIWA protocol -Thiamine folate  replacement  Chronic kidney disease stage IIIa.   -Continue IV fluid resuscitation -Monitoring BUN/creatinine -Creatinine 1.28, 1.3, 1.36   Nausea vomiting -Improving tolerating p.o. -Continue IV Protonix, as needed antiemetics -CT of the head was obtained IMPRESSION: No acute intracranial abnormality. Findings consistent with mild age related atrophy and chronic small vessel ischemia  Hypokalemia -Was repleted, 3.3, 3.6 today  Consultants: Cardiology   ----------------------------------------------------------------------------------------------------------------------------------------------  DVT prophylaxis:  Lovenox SQ Code Status:   Code Status: Full Code Family Communication: No family member present at bedside-discussed with patient The above findings and plan of care has been discussed with patient (and family )  in detail,  they expressed understanding and agreement of above. -Advance care planning has been discussed.   Admission status:    Status is: Inpatient  Remains inpatient appropriate because:Inpatient level of care appropriate due to severity of illness   Dispo: The patient is from: Home              Anticipated d/c is to: Home              Anticipated d/c date is: 2 days              Patient currently  is not medically stable to d/c.        Procedures:   No admission procedures for hospital encounter.     Antimicrobials:  Anti-infectives (From admission, onward)   None       Medication:  . amLODipine  10 mg Oral Daily  . apixaban  5 mg Oral BID  . aspirin EC  81 mg Oral Daily  . folic acid  1 mg Oral Daily  . hydrALAZINE  50 mg Oral Q8H  . metoprolol succinate  50 mg Oral Q breakfast  . multivitamin with minerals  1 tablet Oral Daily  . pantoprazole (PROTONIX) IV  40 mg Intravenous Q12H  . [START ON 11/14/2019] pneumococcal 23 valent vaccine  0.5 mL Intramuscular Tomorrow-1000  . thiamine  100 mg Oral Daily   Or  . thiamine   100 mg Intravenous Daily    acetaminophen **OR** acetaminophen, hydrALAZINE, labetalol, LORazepam **OR** LORazepam, ondansetron (ZOFRAN) IV   Objective:   Vitals:   11/13/19 0407 11/13/19 0533 11/13/19 0827 11/13/19 0828  BP: (!) 187/104 (!) 178/85 (!) 182/104 (!) 189/104  Pulse: (!) 56  62 62  Resp: 16  18   Temp: 97.8 F (36.6 C)     TempSrc: Oral     SpO2: 100%   100%  Weight:      Height:        Intake/Output Summary (Last 24 hours) at 11/13/2019 1210 Last data filed at 11/13/2019 0420 Gross per 24 hour  Intake 0 ml  Output 700 ml  Net -700 ml   Filed Weights   11/11/19 1452 11/12/19 2203  Weight: 80.7 kg 81 kg     Examination:      Physical Exam:   General:  Alert, oriented, cooperative, no distress;   HEENT:  Normocephalic, PERRL, otherwise with in Normal limits   Neuro:  CNII-XII intact. , normal motor and sensation, reflexes intact   Lungs:   Clear to auscultation BL, Respirations unlabored, no wheezes / crackles  Cardio:    S1/S2, RRR, No murmure, No Rubs or Gallops   Abdomen:   Soft, non-tender, bowel sounds active all four quadrants,  no guarding or peritoneal signs.  Muscular skeletal:  Limited exam - in bed, able to move all 4 extremities, Normal strength,  2+ pulses,  symmetric, No pitting edema  Skin:  Dry, warm to touch, negative for any Rashes, No open wounds  Wounds: Please see nursing documentation          ------------------------------------------------------------------------------------------------------------------------------------------    LABs:  CBC Latest Ref Rng & Units 11/12/2019 11/11/2019 06/22/2018  WBC 4.0 - 10.5 K/uL 3.7(L) 6.2 5.9  Hemoglobin 13.0 - 17.0 g/dL 11.4(L) 13.2 11.5(L)  Hematocrit 39 - 52 % 33.4(L) 37.3(L) 34.5(L)  Platelets 150 - 400 K/uL 194 257 265   CMP Latest Ref Rng & Units 11/13/2019 11/12/2019 11/11/2019  Glucose 70 - 99 mg/dL 712(W) 580(D) 983(J)  BUN 8 - 23 mg/dL 16 14 13   Creatinine 0.61 - 1.24 mg/dL  ) 8.25(K) 5.39(J)  Sodium 135 - 145 mmol/L 138 139 139  Potassium 3.5 - 5.1 mmol/L 3.6 3.3(L) 4.4  Chloride 98 - 111 mmol/L 100 101 107  CO2 22 - 32 mmol/L 27 28 24   Calcium 8.9 - 10.3 mg/dL 9.4 8.9 9.6  Total Protein 6.5 - 8.1 g/dL - - 8.2(H)  Total Bilirubin 0.3 - 1.2 mg/dL - - 1.2  Alkaline Phos 38 - 126 U/L - - 74  AST 15 - 41 U/L - -  64(H)  ALT 0 - 44 U/L - - 33       Micro Results Recent Results (from the past 240 hour(s))  SARS Coronavirus 2 by RT PCR (hospital order, performed in [facility] hospital lab) Nasopharyngeal Nasopharyngeal Swab     Status: None   Collection Time: 11/11/19  7:38 PM   Specimen: Nasopharyngeal Swab  Result Value Ref Range Status   SARS Coronavirus 2 NEGATIVE NEGATIVE Final    Comment: (NOTE) SARS-CoV-2 target nucleic acids are NOT DETECTED.  The SARS-CoV-2 RNA is generally detectable in upper and lower respiratory specimens during the acute phase of infection. The lowest concentration of SARS-CoV-2 viral copies this assay can detect is 250 copies / mL. A negative result does not preclude SARS-CoV-2 infection and should not be used as the sole basis for treatment or other patient management decisions.  A negative result may occur with improper specimen collection / handling, submission of specimen other than nasopharyngeal swab, presence of viral mutation(s) within the areas targeted by this assay, and inadequate number of viral copies (<250 copies / mL). A negative result must be combined with clinical observations, patient history, and epidemiological information.  Fact Sheet for Patients:   BoilerBrush.com.cy  Fact Sheet for Healthcare Providers: https://pope.com/  This test is not yet approved or  cleared by the [place] FDA and has been authorized for detection and/or diagnosis of SARS-CoV-2 by FDA under an Emergency Use Authorization (EUA).  This EUA will remain in effect  (meaning this test can be used) for the duration of the COVID-19 declaration under Section 564(b)(1) of the Act, 21 U.S.C. section 360bbb-3(b)(1), unless the authorization is terminated or revoked sooner.  Performed at [facility], [address] [place], [place] [address]     Radiology Reports CT HEAD WO CONTRAST  Result Date: 11/11/2019 CLINICAL DATA:  Nausea vomiting EXAM: CT HEAD WITHOUT CONTRAST TECHNIQUE: Contiguous axial images were obtained from the base of the skull through the vertex without intravenous contrast. COMPARISON:  None. FINDINGS: Brain: No evidence of acute territorial infarction, hemorrhage, hydrocephalus,extra-axial collection or mass lesion/mass effect. There is dilatation the ventricles and sulci consistent with age-related atrophy. Low-attenuation changes in the deep white matter consistent with small vessel ischemia. Prior lacunar infarct involving the right thalamus Vascular: No hyperdense vessel or unexpected calcification. Skull: The skull is intact. No fracture or focal lesion identified. Sinuses/Orbits: The visualized paranasal sinuses and mastoid air cells are clear. The orbits and globes intact. Other: None IMPRESSION: No acute intracranial abnormality. Findings consistent with mild age related atrophy and chronic small vessel ischemia Electronically Signed   By: Jonna Clark M.D.   On: 11/11/2019 19:22   DG Chest Portable 1 View  Result Date: 11/11/2019 CLINICAL DATA:  Shortness of breath. EXAM: PORTABLE CHEST 1 VIEW COMPARISON:  June 20, 2018. FINDINGS: The heart size and mediastinal contours are within normal limits. Both lungs are clear. No pneumothorax or pleural effusion is noted. The visualized skeletal structures are unremarkable. IMPRESSION: No active disease. Electronically Signed   By: Lupita Raider M.D.   On: 11/11/2019 15:14    SIGNED: Kendell Bane, MD, FACP, FHM. [facility],  Pager (please use [email or web address] to  page/text)  If 7PM-7AM, please contact night-coverage [email or web address] 11/13/2019, 12:10 PM

## 2019-11-13 NOTE — Discharge Instructions (Signed)

## 2019-11-14 LAB — BASIC METABOLIC PANEL
Anion gap: 9 (ref 5–15)
BUN: 20 mg/dL (ref 8–23)
CO2: 28 mmol/L (ref 22–32)
Calcium: 9.2 mg/dL (ref 8.9–10.3)
Chloride: 101 mmol/L (ref 98–111)
Creatinine, Ser: 1.36 mg/dL — ABNORMAL HIGH (ref 0.61–1.24)
GFR calc Af Amer: >60 mL/min (ref >60)
GFR calc non Af Amer: 54 mL/min — ABNORMAL LOW (ref >60)
Glucose, Bld: 112 mg/dL — ABNORMAL HIGH (ref 70–99)
Potassium: 3.8 mmol/L (ref 3.5–5.1)
Sodium: 138 mmol/L (ref 135–145)

## 2019-11-14 MED ORDER — FOLIC ACID 1 MG PO TABS: 1.0000 mg | ORAL_TABLET | Freq: Every day | ORAL | 0 refills | Status: AC

## 2019-11-14 MED ORDER — APIXABAN 5 MG PO TABS: 5.0000 mg | ORAL_TABLET | Freq: Two times a day (BID) | ORAL | 2 refills | Status: DC

## 2019-11-14 MED ORDER — PNEUMOCOCCAL VAC POLYVALENT 25 MCG/0.5ML IJ INJ: 0.5000 mL | INJECTION | INTRAMUSCULAR | 0 refills | Status: AC

## 2019-11-14 MED ORDER — ADULT MULTIVITAMIN W/MINERALS CH: 1.0000 | ORAL_TABLET | Freq: Every day | ORAL | 0 refills | Status: AC

## 2019-11-14 MED ORDER — THIAMINE HCL 100 MG PO TABS: 100.0000 mg | ORAL_TABLET | Freq: Every day | ORAL | 0 refills | Status: AC

## 2019-11-14 MED ORDER — HYDRALAZINE HCL 50 MG PO TABS: 50.0000 mg | ORAL_TABLET | Freq: Three times a day (TID) | ORAL | 0 refills | Status: DC

## 2019-11-14 MED ORDER — METOPROLOL SUCCINATE ER 50 MG PO TB24: 50.0000 mg | ORAL_TABLET | Freq: Every day | ORAL | 3 refills | Status: DC

## 2019-11-14 MED ORDER — AMLODIPINE BESYLATE 10 MG PO TABS: 10.0000 mg | ORAL_TABLET | Freq: Every day | ORAL | 1 refills | Status: DC

## 2019-11-14 NOTE — Plan of Care (Signed)

## 2019-11-14 NOTE — Discharge Summary (Signed)
Physician Discharge Summary [facility]    Patient: Jacob Waters                   Admit date: 11/11/2019   DOB: 10-27-1955             Discharge date:11/14/2019/8:45 AM ACZ:[id number]                          PCP: Patient, No Pcp Per  Disposition: HOME   Recommendations for Outpatient Follow-up:   . Follow up: in 1 week with your cardiology and PCP  Discharge Condition: Stable   Code Status:   Code Status: Full Code  Diet recommendation: Regular healthy diet   Discharge Diagnoses:    Active Problems:   Alcohol withdrawal (HCC)   Accelerated hypertension   Flutter-fibrillation (HCC)   History of Present Illness/ Hospital Course [name] Summary:   Per HPI:  GregoryMilesis a64 y.o.malewith a known history of alcohol abuse and atrial flutter presents to the hospital with shortness of breath. He was found to have a very accelerated blood pressure and fast heart rate.  He was given IV fluids and ordered magnesium. He is feeling a little bit better now.  Heart rate converted back to normal sinus rhythm.Patient states he has gone back to drinking.    Accelerated hypertension.  -Monitoring closely, patient currently on Norvasc, metoprolol Also added hydralazine p.o.--- blood pressure remains elevated    -On admission reported patient has run out of his meds for 1 week.   -CT of the head obtained reviewed negative for any acute findings, chronic changes   SVT andParoxysmal atrial flutter. On admission:  First EKG showed sinus tachycardia 143 bpm Next EKG showed SVT at 160 lastEKG sinus rhythm 88 bpm Heart rate has improved to 62,  -Patient has been started on aspirin, Eliquis Remains on Toprol-XL -Appreciate cardiology follow    Hypomagnesemia -was repleted IV  Alcohol withdrawal.  -Monitoring closely no signs of overt DTs, only hypotensive -CIWA protocol -Thiamine folate replacement  Chronic kidney disease stage IIIa.   -Continue IV fluid resuscitation -Monitoring BUN/creatinine -Creatinine 1.28, 1.3, 1.36  Nausea vomiting -Much improved tolerating p.o.  -CT of the head was obtained IMPRESSION: No acute intracranial abnormality. Findings consistent with mild age related atrophy and chronic small vessel ischemia  Hypokalemia -Was repleted, 3.3, 3.6 today  Consultants: Cardiology   ----------------------------------------------------------------------------------------------------------------------------------------------  Code Status:   Code Status: Full Code Family Communication: No family member present at bedside-discussed with patient    Dispo: The patient is from: Home  Anticipated d/c is to: Home      Discharge Instructions:   Discharge Instructions    Activity as tolerated - No restrictions   Complete by: As directed    Call MD for:  difficulty breathing, headache or visual disturbances   Complete by: As directed    Call MD for:  persistant dizziness or light-headedness   Complete by: As directed    Call MD for:  persistant nausea and vomiting   Complete by: As directed    Call MD for:  redness, tenderness, or signs of infection (pain, swelling, redness, odor or green/yellow discharge around incision site)   Complete by: As directed    Diet - low sodium heart healthy   Complete by: As directed    Discharge instructions   Complete by: As directed    Follow-up with cardiology and PCP within 1-2 weeks.   Please abstain from any  alcohol or alcohol products. Take your medication as prescribed including anticoagulant Eliquis--which will reduce your risk for stroke Check your blood pressure daily... Keep a log, PCP may adjust your BP meds   Increase activity slowly   Complete by: As directed        Medication List    TAKE these medications   amLODipine 10 MG tablet Commonly known as: NORVASC Take 1 tablet (10 mg total) by mouth daily.     apixaban 5 MG Tabs tablet Commonly known as: ELIQUIS Take 1 tablet (5 mg total) by mouth 2 (two) times daily.   aspirin EC 81 MG tablet Take 1 tablet (81 mg total) by mouth daily.   folic acid 1 MG tablet Commonly known as: FOLVITE Take 1 tablet (1 mg total) by mouth daily for 5 days. Start taking on: November 15, 2019   hydrALAZINE 50 MG tablet Commonly known as: APRESOLINE Take 1 tablet (50 mg total) by mouth every 8 (eight) hours.   metoprolol succinate 50 MG 24 hr tablet Commonly known as: TOPROL-XL Take 1 tablet (50 mg total) by mouth daily with breakfast. Take with or immediately following a meal. Start taking on: November 15, 2019 What changed:   medication strength  how much to take  when to take this   multivitamin with minerals Tabs tablet Take 1 tablet by mouth daily. Start taking on: November 15, 2019   pneumococcal 23 valent vaccine 25 MCG/0.5ML injection Commonly known as: PNEUMOVAX-23 Inject 0.5 mLs into the muscle tomorrow at 10 am for 1 dose.   thiamine 100 MG tablet Take 1 tablet (100 mg total) by mouth daily for 5 days. Start taking on: November 15, 2019       No Known Allergies   Procedures /Studies:   CT HEAD WO CONTRAST  Result Date: 11/11/2019 CLINICAL DATA:  Nausea vomiting EXAM: CT HEAD WITHOUT CONTRAST TECHNIQUE: Contiguous axial images were obtained from the base of the skull through the vertex without intravenous contrast. COMPARISON:  None. FINDINGS: Brain: No evidence of acute territorial infarction, hemorrhage, hydrocephalus,extra-axial collection or mass lesion/mass effect. There is dilatation the ventricles and sulci consistent with age-related atrophy. Low-attenuation changes in the deep white matter consistent with small vessel ischemia. Prior lacunar infarct involving the right thalamus Vascular: No hyperdense vessel or unexpected calcification. Skull: The skull is intact. No fracture or focal lesion identified. Sinuses/Orbits:  The visualized paranasal sinuses and mastoid air cells are clear. The orbits and globes intact. Other: None IMPRESSION: No acute intracranial abnormality. Findings consistent with mild age related atrophy and chronic small vessel ischemia Electronically Signed   By: Jonna Clark M.D.   On: 11/11/2019 19:22   DG Chest Portable 1 View  Result Date: 11/11/2019 CLINICAL DATA:  Shortness of breath. EXAM: PORTABLE CHEST 1 VIEW COMPARISON:  June 20, 2018. FINDINGS: The heart size and mediastinal contours are within normal limits. Both lungs are clear. No pneumothorax or pleural effusion is noted. The visualized skeletal structures are unremarkable. IMPRESSION: No active disease. Electronically Signed   By: Lupita Raider M.D.   On: 11/11/2019 15:14     Subjective:   Patient was seen and examined 11/14/2019, 8:45 AM Patient stable today. No acute distress.  No issues overnight Stable for discharge.  Discharge Exam:    Vitals:   11/13/19 1637 11/13/19 2023 11/14/19 0415 11/14/19 0758  BP: (!) 144/87 (!) 159/97 (!) 157/92 127/79  Pulse: 68 71 69 63  Resp: 18 20 18 20   Temp:  98.2 F (36.8 C) 98.8 F (37.1 C) 98.7 F (37.1 C)   TempSrc: Oral     SpO2: 100% 100% 99% 100%  Weight:      Height:        General: Pt lying comfortably in bed & appears in no obvious distress. Cardiovascular: S1 & S2 heard, RRR, S1/S2 +. No murmurs, rubs, gallops or clicks. No JVD or pedal edema. Respiratory: Clear to auscultation without wheezing, rhonchi or crackles. No increased work of breathing. Abdominal:  Non-distended, non-tender & soft. No organomegaly or masses appreciated. Normal bowel sounds heard. CNS: Alert and oriented. No focal deficits. Extremities: no edema, no cyanosis    The results of significant diagnostics from this hospitalization (including imaging, microbiology, ancillary and laboratory) are listed below for reference.      Microbiology:   Recent Results (from the past 240 hour(s))   SARS Coronavirus 2 by RT PCR (hospital order[facility]) Nasopharyngeal Nasopharyngeal Swab     Status: None   Collection Time: 11/11/19  7:38 PM   Specimen: Nasopharyngeal Swab  Result Value Ref Range Status   SARS Coronavirus 2 NEGATIVE NEGATIVE Final    Comment: (NOTE) SARS-CoV-2 target nucleic acids are NOT DETECTED.  The SARS-CoV-2 RNA is generally detectable in upper and lower respiratory specimens during the acute phase of infection. The lowest concentration of SARS-CoV-2 viral copies this assay can detect is 250 copies / mL. A negative result does not preclude SARS-CoV-2 infection and should not be used as the sole basis for treatment or other patient management decisions.  A negative result may occur with improper specimen collection / handling, submission of specimen other than nasopharyngeal swab, presence of viral mutation(s) within the areas targeted by this assay, and inadequate number of viral copies (<250 copies / mL). A negative result must be combined with clinical observations, patient history, and epidemiological information.  Fact Sheet for Patients:   BoilerBrush.com.cyhttps://www.fda.gov/media/136312/download  Fact Sheet for Healthcare Providers: https://pope.com/https://www.fda.gov/media/136313/download  This test is not yet approved or  cleared by the [place] FDA and has been authorized for detection and/or diagnosis of SARS-CoV-2 by FDA under an Emergency Use Authorization (EUA).  This EUA will remain in effect (meaning this test can be used) for the duration of the COVID-19 declaration under Section 564(b)(1) of the Act, 21 U.S.C. section 360bbb-3(b)(1), unless the authorization is terminated or revoked sooner.  Performed at [facility], [address] [place], [place] [address]      Labs:   CBC: Recent Labs  Lab 11/11/19 1455 11/12/19 0730  WBC 6.2 3.7*  NEUTROABS 5.2  --   HGB 13.2 11.4*  HCT 37.3* 33.4*  MCV 84.4 87.9  PLT  257 194   Basic Metabolic Panel: Recent Labs  Lab 11/11/19 1455 11/12/19 0730 11/13/19 0404 11/14/19 0539  NA 139 139 138 138  K 4.4 3.3* 3.6 3.8  CL 107 101 100 101  CO2 24 28 27 28   GLUCOSE 135* 106* 116* 112*  BUN 13 14 16 20   CREATININE 1.28* 1.30* 1.36* 1.36*  CALCIUM 9.6 8.9 9.4 9.2  MG 1.6*  --   --   --    Liver Function Tests: Recent Labs  Lab 11/11/19 1455  AST 64*  ALT 33  ALKPHOS 74  BILITOT 1.2  PROT 8.2*  ALBUMIN 4.7   BNP (last 3 results) No results for input(s): BNP in the last 8760 hours. Cardiac Enzymes: No results for input(s): CKTOTAL, CKMB, CKMBINDEX,  TROPONINI in the last 168 hours. CBG: No results for input(s): GLUCAP in the last 168 hours. Hgb A1c No results for input(s): HGBA1C in the last 72 hours. Lipid Profile No results for input(s): CHOL, HDL, LDLCALC, TRIG, CHOLHDL, LDLDIRECT in the last 72 hours. Thyroid function studies No results for input(s): TSH, T4TOTAL, T3FREE, THYROIDAB in the last 72 hours.  Invalid input(s): FREET3 Anemia work up No results for input(s): VITAMINB12, FOLATE, FERRITIN, TIBC, IRON, RETICCTPCT in the last 72 hours. Urinalysis    Component Value Date/Time   COLORURINE STRAW (A) 06/20/2018 1923   APPEARANCEUR CLEAR (A) 06/20/2018 1923   LABSPEC 1.003 (L) 06/20/2018 1923   PHURINE 6.0 06/20/2018 1923   GLUCOSEU NEGATIVE 06/20/2018 1923   HGBUR SMALL (A) 06/20/2018 1923   BILIRUBINUR NEGATIVE 06/20/2018 1923   KETONESUR NEGATIVE 06/20/2018 1923   PROTEINUR 30 (A) 06/20/2018 1923   NITRITE NEGATIVE 06/20/2018 1923   LEUKOCYTESUR NEGATIVE 06/20/2018 1923         Time coordinating discharge: Over 55 minutes  SIGNED: Kendell Bane, MD, FACP, FHM. [facility],  Please use [email or web address] to Page If 7PM-7AM, please contact night-coverage [email or web address], Password [facility] 11/14/2019, 8:45 AM

## 2019-11-14 NOTE — Progress Notes (Signed)
The AVS was done x3 before discharge patient is now discharged  Being contacted by nursing staff once again that there are question about medication  AVS will be done once again.. Making sure medications are transmitted to [facility]

## 2019-11-14 NOTE — Progress Notes (Signed)
Patient given AVS at this time, declines questions. IV removed. Educated about drinking and proper blood pressure monitoring. Patient given information about PCP help.

## 2019-11-14 NOTE — Plan of Care (Signed)
Tele Lauren called and made aware that patient is discharged and will be off monitor.

## 2019-11-21 ENCOUNTER — Encounter: Payer: Self-pay | Admitting: Physician Assistant

## 2019-11-21 ENCOUNTER — Other Ambulatory Visit: Payer: Self-pay

## 2019-11-21 ENCOUNTER — Ambulatory Visit (INDEPENDENT_AMBULATORY_CARE_PROVIDER_SITE_OTHER): Payer: Self-pay | Admitting: Physician Assistant

## 2019-11-21 VITALS — BP 170/80 | HR 79 | Ht 65.0 in | Wt 183.4 lb

## 2019-11-21 DIAGNOSIS — I4892 Unspecified atrial flutter: Secondary | ICD-10-CM

## 2019-11-21 DIAGNOSIS — Z7901 Long term (current) use of anticoagulants: Secondary | ICD-10-CM

## 2019-11-21 DIAGNOSIS — I10 Essential (primary) hypertension: Secondary | ICD-10-CM

## 2019-11-21 DIAGNOSIS — Z87898 Personal history of other specified conditions: Secondary | ICD-10-CM

## 2019-11-21 MED ORDER — METOPROLOL SUCCINATE ER 50 MG PO TB24: 75.0000 mg | ORAL_TABLET | Freq: Every day | ORAL | 3 refills | Status: DC

## 2019-11-21 NOTE — Patient Instructions (Signed)
Medication Instructions:  1- INCREASE Toprol 75 mg total once daily *If you need a refill on your cardiac medications before your next appointment, please call your pharmacy*   Lab Work: none ordered If you have labs (blood work) drawn today and your tests are completely normal, you will receive your results only by: Marland Kitchen MyChart Message (if you have MyChart) OR . A paper copy in the mail If you have any lab test that is abnormal or we need to change your treatment, we will call you to review the results.   Testing/Procedures: none ordered   Follow-Up: At [facility], you and your health needs are our priority.  As part of our continuing mission to provide you with exceptional heart care, we have created designated Provider Care Teams.  These Care Teams include your primary Cardiologist (physician) and Advanced Practice Providers (APPs -  Physician Assistants and Nurse Practitioners) who all work together to provide you with the care you need, when you need it.  We recommend signing up for the patient portal called "MyChart".  Sign up information is provided on this After Visit Summary.  MyChart is used to connect with patients for Virtual Visits (Telemedicine).  Patients are able to view lab/test results, encounter notes, upcoming appointments, etc.  Non-urgent messages can be sent to your provider as well.   To learn more about what you can do with MyChart, go to [email or web address].    Your next appointment:   2 week(s)  The format for your next appointment:   In Person  Provider:     You may see Yvonne Kendall, MD or Marisue Ivan, PA-C

## 2019-11-21 NOTE — Progress Notes (Signed)
Office Visit    Patient Name: Jacob Waters Date of Encounter: 11/21/2019  Primary Care Provider:  Patient, No Pcp Per Primary Cardiologist:  Yvonne Kendall, MD  Chief Complaint    Chief Complaint  Patient presents with  . office visit    Hospital F/U; Meds verbally reviewed with patient.    64 year old male with history of hypertension, h/o polysubstance use including alcohol use, paroxysmal atrial flutter (06/2018), and seen today for hospital follow-up.  Past Medical History    Past Medical History:  Diagnosis Date  . Alcohol abuse   . Atrial flutter (HCC)   . Hypertension    Past Surgical History:  Procedure Laterality Date  . NO PAST SURGERIES      Allergies  No Known Allergies  History of Present Illness    Jacob Waters is a 64 y.o. male with PMH as above.     He was admitted 06/2018 with left arm and flank numbness and paresthesias.  The symptoms persisted; therefore, he presented to [facility] ED.  He was found to be in atrial flutter with variable block and frequently elevated ventricular rates.  He was noted to have elevated blood alcohol level at that time.  He was placed on IV diltiazem and oral metoprolol for rate control.  He subsequently converted to NSR on metoprolol tartrate 100 mg twice daily and ASA 81 mg daily, as well as amlodipine 10 mg daily for blood pressure control.  He was seen after discharge via telemedicine 06/29/2018 and overall doing well from a cardiac standpoint.  He reported getting more tired than usual when compared with a few years ago.  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  He was not monitoring his blood pressure at home but hoped to use his sisters BP cuff in the near future.  No medication changes.  When seen in follow-up 08/19/2018, he was only taking his metoprolol tartrate once daily.  He was asymptomatic and bradycardia noted on that day's visit.  He was transitioned to metoprolol succinate 100 mg daily.   Alcohol cessation was advised.  He was continued on Toprol and amlodipine.  On 11/12/2019, he presented to [facility] emergency department due to nausea and emesis.  He reported running out of his cardiac medications 2 weeks prior.  He had been drinking approximately 24 packs of beers daily for the last 2 weeks at the same time as he was off of his medications.  On 11/11/2019, he felt nauseated and vomited with some diaphoresis.  He therefore presented to the emergency room.  In the ED, EKG showed atrial flutter with ventricular rates into the 150s.  He was also hypertensive.  He was restarted on blood pressure medications, including Toprol-XL.  Over the subsequent 24 hours, it was noted his rates were low to bradycardic in the 40s.  He reported feeling improved, compared to the day before admission.  He was started on CIWA protocol.  He was started on anticoagulation with Eliquis 5 mg twice daily due to CHA2DS2VASc score of at least 2.  It was noted that he had a history of medication noncompliance; therefore, he may benefit from once daily Xarelto to be reassessed in clinic.  Toprol-XL was decreased to 50 mg daily.  Today, 11/21/2019, he presents to clinic and reports that he is doing well from a cardiac standpoint.  He denies any chest pain, racing heart rate, palpitations, presyncope, syncope, orthopnea, PND, early satiety, or weight gain.  He does not feel as if he has had any recurrent episodes of atrial tachycardia.  He reports that he has not been taking his Eliquis 5 mg twice daily, as he was unable to afford this medication (samples, coupon provided as below).  He states he is waiting on the arrival of his insurance card.  He was reportedly provided with 5 pills of each medication on his list at discharge.  He reports taking the rest of his medications this morning at approximately 8 AM.  BP today uncontrolled at 170/80.  On further questioning, he reports taking hydralazine only twice daily.  He is not checking  his blood pressure at home with recommendation to obtain a brachial BP cuff.  He denies any alcohol use since discharge.  He continues to work mowing lawns and reports that he has not had any shortness of breath or chest pain with this yard work.  Home Medications    Prior to Admission medications   Medication Sig Start Date End Date Taking? Authorizing Provider  amLODipine (NORVASC) 10 MG tablet Take 1 tablet (10 mg total) by mouth daily. 11/14/19 12/14/19  Shahmehdi, Gemma Payor, MD  apixaban (ELIQUIS) 5 MG TABS tablet Take 1 tablet (5 mg total) by mouth 2 (two) times daily. 11/14/19 12/14/19  ShahmehdiGemma Payor, MD  aspirin EC 81 MG tablet Take 1 tablet (81 mg total) by mouth daily. Patient not taking: Reported on 11/12/2019 06/22/18   Milagros Loll, MD  hydrALAZINE (APRESOLINE) 50 MG tablet Take 1 tablet (50 mg total) by mouth every 8 (eight) hours. 11/14/19 12/14/19  Kendell Bane, MD  metoprolol succinate (TOPROL-XL) 50 MG 24 hr tablet Take 1 tablet (50 mg total) by mouth daily with breakfast. Take with or immediately following a meal. 11/15/19 12/15/19  Shahmehdi, Gemma Payor, MD  Multiple Vitamin (MULTIVITAMIN WITH MINERALS) TABS tablet Take 1 tablet by mouth daily. 11/15/19 12/15/19  Kendell Bane, MD    Review of Systems    He denies chest pain, palpitations, dyspnea, pnd, orthopnea, n, v, dizziness, syncope, edema, weight gain, or early satiety. .   All other systems reviewed and are otherwise negative except as noted above.  Physical Exam    VS:  BP (!) 170/80 (BP Location: Left Arm, Patient Position: Sitting, Cuff Size: Normal)   Pulse 79   Ht 5\' 5"  (1.651 m)   Wt 183 lb 6 oz (83.2 kg)   SpO2 98%   BMI 30.52 kg/m  , BMI Body mass index is 30.52 kg/m. GEN: Well nourished, well developed, in no acute distress. HEENT: normal. Neck: Supple, no JVD, carotid bruits, or masses. Cardiac: RRR, no murmurs, rubs, or gallops. No clubbing, cyanosis, edema.  Radials/DP/PT 2+ and equal bilaterally.   Respiratory:  Respirations regular and unlabored, clear to auscultation bilaterally. GI: Soft, nontender, nondistended, BS + x 4. MS: no deformity or atrophy. Skin: warm and dry, no rash. Neuro:  Strength and sensation are intact. Psych: Normal affect.  Accessory Clinical Findings    ECG personally reviewed by me today -NSR, 79 bpm, poor conduction lead III, LVH- no acute changes.  VITALS Reviewed today   Temp Readings from Last 3 Encounters:  11/14/19 98.7 F (37.1 C)  06/22/18 98.9 F (37.2 C) (Axillary)   BP Readings from Last 3 Encounters:  11/21/19 (!) 170/80  11/14/19 127/79  08/19/18 140/70   Pulse Readings from Last 3 Encounters:  11/21/19 79  11/14/19 63  08/19/18 (!) 49    Wt Readings  from Last 3 Encounters:  11/21/19 183 lb 6 oz (83.2 kg)  11/12/19 178 lb 9.2 oz (81 kg)  08/19/18 178 lb (80.7 kg)     LABS  reviewed today   Lab Results  Component Value Date   WBC 3.7 (L) 11/12/2019   HGB 11.4 (L) 11/12/2019   HCT 33.4 (L) 11/12/2019   MCV 87.9 11/12/2019   PLT 194 11/12/2019   Lab Results  Component Value Date   CREATININE 1.36 (H) 11/14/2019   BUN 20 11/14/2019   NA 138 11/14/2019   K 3.8 11/14/2019   CL 101 11/14/2019   CO2 28 11/14/2019   Lab Results  Component Value Date   ALT 33 11/11/2019   AST 64 (H) 11/11/2019   ALKPHOS 74 11/11/2019   BILITOT 1.2 11/11/2019   No results found for: CHOL, HDL, LDLCALC, LDLDIRECT, TRIG, CHOLHDL  Lab Results  Component Value Date   HGBA1C 6.0 (H) 06/21/2018   Lab Results  Component Value Date   TSH 0.751 06/21/2018     STUDIES/PROCEDURES reviewed today   TTE (06/21/2018): Normal LV size.  LVEF 50-55%.  Normal RV size and function.  No significant valvular abnormality seen, though evaluation was limited by cardiac and suboptimal acoustic windows.   Assessment & Plan    Paroxysmal atrial flutter with RVR --NSR with controlled rate today.  He reports that he has not had any racing heart rate  or palpitations since discharge. He has been taking his Toprol 50 mg at breakfast and tolerating it well.  Rate today well controlled in NSR; however, as below, increased to ToprolXL to 75 mg daily today and given room in HR for more optimal BP support.  Of note, he was previously on Toprol XL 100 mg daily and will likely benefit from escalating to this dose at RTC if tolerated.  Reassess at RTC.  He has not yet started anticoagulation with Eliquis 5 mg twice daily, as he reports this medication is too expensive.  We will provide him with samples and coupon today to patch him through until he can get his insurance coverage (waiting on his car). CHA2DS2VASc score of at least 2 (HTN, age).  Once he restarts anticoagulation, recommend repeat CBC and reassessment of symptoms.  At this time, he denies any signs or symptoms consistent with bleeding.    Chronic anticoagulation  --Reviewed the indication for anticoagulation given his CHA2DS2VASc score of at least 2 with known atrial flutter with RVR as above.  He will start Eliquis 5 mg twice daily with sample and coupon provided, given he was unable to afford this medication.  He is waiting on his insurance card. Repeat CBC 2 weeks after start or at RTC.   --Reviewed precautions while on anticoagulation, including recommendation for alcohol cessation.  Reviewed signs and symptoms of bleeding.  Advised against NSAIDs while on anticoagulation.  Instructed him to present to the emergency department if he ever falls and hits his head.  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  Essential hypertension, poorly controlled --He does not check his blood pressure at home with BP today significantly elevated.  On discussion with patient, he has been taking his hydralazine 50 mg twice daily rather than 3 times a day.  Recommended 3 times daily dosing.  Given his history of poor medication compliance  and BP today, he will likely benefit from transitioning off of this medication in the future.  Reassess at RTC.  Continue amlodipine 10 mg daily.  Will escalate his current Toprol-XL from 50 mg to Toprol-XL 75 mg daily and reassess BP at RTC.  Tentative plan to escalate to Toprol XL 100 mg daily at that time if room in BP.  He will call the office if BP consistently over 130/80.  He will purchase a brachial BP cuff.  Proper BP technique reviewed.  History of alcohol use --Long discussion regarding recommendation for complete alcohol cessation.  Medication changes: Start Eliquis 5 mg twice daily.  Increase to Toprol 75 mg daily.  Take hydralazine 50 mg 3 times daily as prescribed. Labs ordered: Defer CBC until 2 weeks after start of Eliquis.   Studies / Imaging ordered: None. Future considerations: Additional antihypertensives if needed, increase to Toprol 100 mg daily (previous dose prior to admission). Disposition: 2 weeks    Lennon AlstromJacquelyn D Evalynne Locurto, PA-C 11/21/2019

## 2019-12-06 ENCOUNTER — Encounter: Payer: Self-pay | Admitting: Family

## 2019-12-06 ENCOUNTER — Ambulatory Visit (INDEPENDENT_AMBULATORY_CARE_PROVIDER_SITE_OTHER): Payer: Self-pay | Admitting: Family

## 2019-12-06 ENCOUNTER — Other Ambulatory Visit: Payer: Self-pay

## 2019-12-06 VITALS — BP 162/72 | HR 69 | Ht 65.0 in | Wt 182.1 lb

## 2019-12-06 DIAGNOSIS — Z7901 Long term (current) use of anticoagulants: Secondary | ICD-10-CM

## 2019-12-06 DIAGNOSIS — Z87898 Personal history of other specified conditions: Secondary | ICD-10-CM

## 2019-12-06 DIAGNOSIS — I4892 Unspecified atrial flutter: Secondary | ICD-10-CM

## 2019-12-06 DIAGNOSIS — Z720 Tobacco use: Secondary | ICD-10-CM

## 2019-12-06 DIAGNOSIS — I10 Essential (primary) hypertension: Secondary | ICD-10-CM

## 2019-12-06 MED ORDER — HYDRALAZINE HCL 50 MG PO TABS: 50.0000 mg | ORAL_TABLET | Freq: Three times a day (TID) | ORAL | 2 refills | Status: DC

## 2019-12-06 NOTE — Patient Instructions (Addendum)
Medication Instructions:  Your physician has recommended you make the following change in your medication:   STOP Aspirin  RESUME Eliquis 5mg  twice daily *Given 4 sample boxes today Lot Exp: 09/2021    TAKE your Hydralazine 50mg  three times per day *Take it at 8 AM, 3 PM, 9 PM  *If you need a refill on your cardiac medications before your next appointment, please call your pharmacy*  Lab Work: Your provider recommends that you have lab work today: CBC, BMP  If you have labs (blood work) drawn today and your tests are completely normal, you will receive your results only by: 10/2021 MyChart Message (if you have MyChart) OR . A paper copy in the mail If you have any lab test that is abnormal or we need to change your treatment, we will call you to review the results.  Testing/Procedures: Your EKG today shows normal sinus rhythm which is a good result!  Follow-Up: At [facility], you and your health needs are our priority.  As part of our continuing mission to provide you with exceptional heart care, we have created designated Provider Care Teams.  These Care Teams include your primary Cardiologist (physician) and Advanced Practice Providers (APPs -  Physician Assistants and Nurse Practitioners) who all work together to provide you with the care you need, when you need it.  We recommend signing up for the patient portal called "MyChart".  Sign up information is provided on this After Visit Summary.  MyChart is used to connect with patients for Virtual Visits (Telemedicine).  Patients are able to view lab/test results, encounter notes, upcoming appointments, etc.  Non-urgent messages can be sent to your provider as well.   To learn more about what you can do with MyChart, go to Marland Kitchen.    Your next appointment:   2 week(s)  The format for your next appointment:   In Person  Provider:   You may see [facility], MD or one of the following Advanced Practice  Providers on your designated Care Team:    [email or web address], NP  Yvonne Kendall, PA-C  Nicolasa Ducking, PA-C  Eula Listen, NP  Cadence Marisue Ivan, Gillian Shields  Other Instructions  Tips to Measure your Blood Pressure Correctly  To determine whether you have hypertension, a medical professional will take a blood pressure reading. How you prepare for the test, the position of your arm, and other factors can change a blood pressure reading by 10% or more. That could be enough to hide high blood pressure, start you on a drug you don't really need, or lead your doctor to incorrectly adjust your medications.  National and international guidelines offer specific instructions for measuring blood pressure. If a doctor, nurse, or medical assistant isn't doing it right, don't hesitate to ask him or her to get with the guidelines.  Here's what you can do to ensure a correct reading: . Don't drink a caffeinated beverage or smoke during the 30 minutes before the test. . Sit quietly for five minutes before the test begins. . During the measurement, sit in a chair with your feet on the floor and your arm supported so your elbow is at about heart level. . The inflatable part of the cuff should completely cover at least 80% of your upper arm, and the cuff should be placed on bare skin, not over a shirt. . Don't talk during the measurement. . Have your blood pressure measured twice, with a brief break in between. If the readings are  different by 5 points or more, have it done a third time.  There are times to break these rules. If you sometimes feel lightheaded when getting out of bed in the morning or when you stand after sitting, you should have your blood pressure checked while seated and then while standing to see if it falls from one position to the next.  Because blood pressure varies throughout the day, your doctor will rarely diagnose hypertension on the basis of a single reading. Instead, he or she will want  to confirm the measurements on at least two occasions, usually within a few weeks of one another. The exception to this rule is if you have a blood pressure reading of 180/110 mm Hg or higher. A result this high usually calls for prompt treatment.    Blood pressure categories  Blood pressure category SYSTOLIC (upper number)  DIASTOLIC (lower number)  Normal Less than 120 mm Hg and Less than 80 mm Hg  Elevated 120-129 mm Hg and Less than 80 mm Hg  High blood pressure: Stage 1 hypertension 130-139 mm Hg or 80-89 mm Hg  High blood pressure: Stage 2 hypertension 140 mm Hg or higher or 90 mm Hg or higher  Hypertensive crisis (consult your doctor immediately) Higher than 180 mm Hg and/or Higher than 120 mm Hg  Source: American Heart Association and American Stroke Association. For more on getting your blood pressure under control, buy Controlling Your Blood Pressure, a Special Health Report from [facility].   Blood Pressure Log   Date   Time  Blood Pressure  Position  Example: Nov 1 9 AM 124/78 sitting

## 2019-12-06 NOTE — Progress Notes (Signed)
Office Visit    Patient Name: Consuelo Thayne Date of Encounter: 12/06/2019  Primary Care Provider:  Patient, No Pcp Per Primary Cardiologist:  Yvonne Kendall, MD Electrophysiologist:  None   Chief Complaint    Jacob Waters is a 64 y.o. male with a hx of HTN, polysubstance abuse, paroxysmal atrial flutter presents today for follow up of atrial flutter.   Past Medical History    Past Medical History:  Diagnosis Date   Alcohol abuse    Atrial flutter (HCC)    Hypertension    Past Surgical History:  Procedure Laterality Date   NO PAST SURGERIES      Allergies  No Known Allergies  History of Present Illness    Jacob Waters is a 64 y.o. male with a hx of HTN, polysubstance abuse, paroxysmal atrial flutter last seen 11/21/2019 by Leafy Kindle, PA.  He was admitted April 2020 with left arm and flank numbness and paresthesias.  He was found to be in atrial flutter with variable block and frequently elevated ventricular rates.  Noted elevated blood alcohol at that time.  Placed on IV diltiazem and oral metoprolol for rate control.  He converted to NSR.  Seen in follow-up 06/29/2018 via telemedicine and doing well, no changes made.  Seen in follow-up 08/19/2018 noting to be taking his metoprolol tartrate once daily.  He was asymptomatic bradycardia noted.  He was transitioned to metoprolol succinate 100 mg daily.  Seen in [facility] ED 11/12/2019 nausea and emesis.  He reported running out of cardiac medications 2 weeks prior.  He had been drinking 24 packs of beers daily for the last 2 weeks.  EKG with atrial flutter with ventricular rates in the 150s and hypertension.  His home medications including Toprol resumed and then noted bradycardia in the 40s.  He was started on CIWA protocol as well as Eliquis 5 mg daily due to CHA2DS2-VASc of at least 2.  His Toprol was reduced to 50 mg daily.  Seen in follow-up 11/21/2019 doing well from cardiac perspective.  Reported no recurrent tachycardia.   He was not taking Eliquis due to cost concerns.  Blood pressure was elevated he was noted to only be taking hydralazine twice daily.  He was recommended to use hydralazine 50 mg 3 times per day as prescribed, Eliquis resumed, and Toprol increased to 75 mg daily.  Presents today for follow-up.  Reports feeling overall well.  Denies recurrent palpitations.  Denies chest pain, pressure, tightness.  Denies shortness of breath at rest and dyspnea on exertion.  He is not checking his blood pressure at home though his sister does have a cuff he can use.  Long discussion regarding atrial flutter and the importance of anticoagulation.  Tells me he has been taking his Eliquis since last seen but has run out of samples.  Denies bleeding complications.  Last dose last night.  He does not have insurance at this time as he works for himself [employer] and plans to apply for [employer].  He has still been taking his hydralazine only twice per day.  We reviewed the importance of taking this medication 3 times per day.  We discussed triggers of atrial flutter.  He has been understanding from alcohol.  He drinks 1 cup of coffee in the morning.  He does chew tobacco and we discussed the importance of cessation.  EKGs/Labs/Other Studies Reviewed:   The following studies were reviewed today: Echo 06/2018   1. The left ventricle has low  normal systolic function, with an ejection  fraction of 50-55%. The cavity size was normal. Left ventricular diastolic  function could not be evaluated due to nondiagnostic images.   2. Evaluation of LVEF is limited due to tachycardia and poor apical  windows. Consider limited echo once atrial flutter/tachycardia has  resolved.   3. The right ventricle has normal systolic function. The cavity was  normal. There is no increase in right ventricular wall thickness.   4. Left atrial size was not well visualized.   5. The mitral valve was not well visualized. Mild thickening of the    mitral valve leaflet.   6. The aortic valve is tricuspid. Mild thickening of the aortic valve.  Mild calcification of the aortic valve. Aortic valve regurgitation was not  assessed by color flow Doppler.   7. The interatrial septum was not well visualized.    EKG:  EKG is ordered today.  The ekg ordered today demonstrates NSR 69bpm with no acute ST/T wave changes.   Recent Labs: 11/11/2019: ALT 33; Magnesium 1.6 11/12/2019: Hemoglobin 11.4; Platelets 194 11/14/2019: BUN 20; Creatinine, Ser 1.36; Potassium 3.8; Sodium 138  Recent Lipid Panel No results found for: CHOL, TRIG, HDL, CHOLHDL, VLDL, LDLCALC, LDLDIRECT  Home Medications   No outpatient medications have been marked as taking for the 12/06/19 encounter (Appointment) with Alver Sorrow, NP.    Review of Systems    Review of Systems  Constitutional: Negative for chills, fever and malaise/fatigue.  Cardiovascular: Negative for chest pain, dyspnea on exertion, irregular heartbeat, leg swelling, near-syncope, orthopnea, palpitations and syncope.  Respiratory: Negative for cough, shortness of breath and wheezing.   Gastrointestinal: Negative for melena, nausea and vomiting.  Genitourinary: Negative for hematuria.  Neurological: Negative for dizziness, light-headedness and weakness.   All other systems reviewed and are otherwise negative except as noted above.  Physical Exam    VS:  There were no vitals taken for this visit. , BMI There is no height or weight on file to calculate BMI. GEN: Well nourished, well developed, in no acute distress. HEENT: normal. Neck: Supple, no JVD, carotid bruits, or masses. Cardiac: RRR, no murmurs, rubs, or gallops. No clubbing, cyanosis, edema.  Radials/DP/PT 2+ and equal bilaterally.  Respiratory:  Respirations regular and unlabored, clear to auscultation bilaterally. GI: Soft, nontender, nondistended, BS + x 4. MS: No deformity or atrophy. Skin: Warm and dry, no rash. Neuro:  Strength and  sensation are intact. Psych: Normal affect.  Assessment & Plan    1. Paroxysmal atrial flutter -maintaining normal sinus rhythm by EKG today.  Continue Toprol 75 mg daily.  Continue Eliquis, as below.  2. Chronic anticoagulation-secondary to CHA2DS2-VASc of at least 2 (HTN, age) and history of atrial flutter.  He was provided samples at clinic 2 weeks ago and tells me he is run out.  He does not have insurance as he is self-employed and plans to apply for Medicare but uncertain how long this will take.  Provided 1 month supply of Eliquis samples.  Given paperwork for Eliquis patient assistance as well as application for [facility].  Strongly encouraged to fill out and bring back.  Will reach out to social work team for additional assistance.  BMP, CBC today.  3. HTN -blood pressure remains elevated.  He has not been taking his hydralazine 3 times per day as prescribed.  He tells me he is home during the day and would be willing to take a third time each day.  Continue Toprol 75 mg daily, continue hydralazine 50 mg 3 times daily.  If we continue to have difficulties with dosing may benefit from transition to a once daily agent.  ACE/ARB may be limited by his renal function though he does have normal GFR. BMP today.   4. History of alcohol use -endorses continued cessation of alcohol use.  Discussed the importance of avoidance in the setting of atrial flutter.  5. Tobacco use - Presently using chewing tobacco.  Encouraged complete cessation and avoidance of nicotine products.  Recommend utilization of 1 800 quit now for resources.  Disposition: Follow up in 2 week(s) with Dr. Okey Dupre or APP  Alver Sorrow, NP 12/06/2019, 11:10 AM

## 2019-12-07 ENCOUNTER — Telehealth: Payer: Self-pay | Admitting: Licensed Clinical Social Worker

## 2019-12-07 ENCOUNTER — Telehealth: Payer: Self-pay | Admitting: *Deleted

## 2019-12-07 LAB — CBC WITH DIFFERENTIAL/PLATELET
Basophils Absolute: 0.1 10*3/uL (ref 0.0–0.2)
Basos: 1 %
EOS (ABSOLUTE): 0.2 10*3/uL (ref 0.0–0.4)
Eos: 4 %
Hematocrit: 36 % — ABNORMAL LOW (ref 37.5–51.0)
Hemoglobin: 12.1 g/dL — ABNORMAL LOW (ref 13.0–17.7)
Immature Grans (Abs): 0 10*3/uL (ref 0.0–0.1)
Immature Granulocytes: 0 %
Lymphocytes Absolute: 1.2 10*3/uL (ref 0.7–3.1)
Lymphs: 30 %
MCH: 29.8 pg (ref 26.6–33.0)
MCHC: 33.6 g/dL (ref 31.5–35.7)
MCV: 89 fL (ref 79–97)
Monocytes Absolute: 0.4 10*3/uL (ref 0.1–0.9)
Monocytes: 10 %
Neutrophils Absolute: 2.3 10*3/uL (ref 1.4–7.0)
Neutrophils: 55 %
Platelets: 433 10*3/uL (ref 150–450)
RBC: 4.06 x10E6/uL — ABNORMAL LOW (ref 4.14–5.80)
RDW: 13.6 % (ref 11.6–15.4)
WBC: 4.1 10*3/uL (ref 3.4–10.8)

## 2019-12-07 LAB — BASIC METABOLIC PANEL
BUN/Creatinine Ratio: 13 (ref 10–24)
BUN: 15 mg/dL (ref 8–27)
CO2: 22 mmol/L (ref 20–29)
Calcium: 9.7 mg/dL (ref 8.6–10.2)
Chloride: 103 mmol/L (ref 96–106)
Creatinine, Ser: 1.19 mg/dL (ref 0.76–1.27)
GFR calc Af Amer: 74 mL/min/{1.73_m2} (ref >59)
GFR calc non Af Amer: 64 mL/min/{1.73_m2} (ref >59)
Glucose: 97 mg/dL (ref 65–99)
Potassium: 3.9 mmol/L (ref 3.5–5.2)
Sodium: 139 mmol/L (ref 134–144)

## 2019-12-07 NOTE — Telephone Encounter (Signed)
-----   Message from Alver Sorrow, NP sent at 12/07/2019  7:53 AM EDT ----- Normal kidney function and electrolytes. CBC shows improvement in hemoglobin. Continue present medications.

## 2019-12-07 NOTE — Telephone Encounter (Signed)
No answer. No voicemail. 

## 2019-12-07 NOTE — Telephone Encounter (Signed)
CSW consulted to speak with pt regarding current lack of insurance and concerns with paying for medications.  CSW called pt but unable to reach and unable to leave a VM  Will attempt to reach out to pt at later time to discuss.  Burna Sis, LCSW Clinical Social Worker [facility] Desk#: [phone] Cell#: [phone]

## 2019-12-08 ENCOUNTER — Encounter: Payer: Self-pay | Admitting: *Deleted

## 2019-12-08 NOTE — Telephone Encounter (Signed)
No answer.  No voicemail.  Letter with results mailed to patient.

## 2019-12-21 ENCOUNTER — Ambulatory Visit (INDEPENDENT_AMBULATORY_CARE_PROVIDER_SITE_OTHER): Payer: Medicare Other | Admitting: Family

## 2019-12-21 ENCOUNTER — Other Ambulatory Visit: Payer: Self-pay

## 2019-12-21 ENCOUNTER — Encounter: Payer: Self-pay | Admitting: Family

## 2019-12-21 VITALS — BP 170/80 | HR 81 | Ht 65.0 in | Wt 178.0 lb

## 2019-12-21 DIAGNOSIS — Z720 Tobacco use: Secondary | ICD-10-CM | POA: Diagnosis not present

## 2019-12-21 DIAGNOSIS — Z7901 Long term (current) use of anticoagulants: Secondary | ICD-10-CM | POA: Diagnosis not present

## 2019-12-21 DIAGNOSIS — Z87898 Personal history of other specified conditions: Secondary | ICD-10-CM

## 2019-12-21 DIAGNOSIS — I10 Essential (primary) hypertension: Secondary | ICD-10-CM

## 2019-12-21 DIAGNOSIS — I4892 Unspecified atrial flutter: Secondary | ICD-10-CM

## 2019-12-21 MED ORDER — HYDRALAZINE HCL 100 MG PO TABS: 100.0000 mg | ORAL_TABLET | Freq: Three times a day (TID) | ORAL | 1 refills | Status: DC

## 2019-12-21 NOTE — Progress Notes (Signed)
Office Visit    Patient Name: Jacob Waters Date of Encounter: 12/22/2019  Primary Care Provider:  Patient, No Pcp Per Primary Cardiologist:  Yvonne Kendall, MD Electrophysiologist:  None   Chief Complaint    Jacob Waters is a 64 y.o. male with a hx of HTN, polysubstance abuse, paroxysmal atrial flutter presents today for medication management.  Past Medical History    Past Medical History:  Diagnosis Date  . Alcohol abuse   . Atrial flutter (HCC)   . Hypertension    Past Surgical History:  Procedure Laterality Date  . NO PAST SURGERIES      Allergies  No Known Allergies  History of Present Illness    Jacob Waters is a 64 y.o. male with a hx of HTN, polysubstance abuse, paroxysmal atrial flutter last seen 12/06/19.  He was admitted April 2020 with left arm and flank numbness and paresthesias.  He was found to be in atrial flutter with variable block and frequently elevated ventricular rates.  Noted elevated blood alcohol at that time.  Placed on IV diltiazem and oral metoprolol for rate control.  He converted to NSR.  Seen in follow-up 06/29/2018 via telemedicine and doing well, no changes made.  Seen in follow-up 08/19/2018 noting to be taking his metoprolol tartrate once daily.  He was asymptomatic bradycardia noted.  He was transitioned to metoprolol succinate 100 mg daily.  Seen in [facility] ED 11/12/2019 nausea and emesis.  He reported running out of cardiac medications 2 weeks prior.  He had been drinking 24 packs of beers daily for the last 2 weeks.  EKG with atrial flutter with ventricular rates in the 150s and hypertension.  His home medications including Toprol resumed and then noted bradycardia in the 40s.  He was started on CIWA protocol as well as Eliquis 5 mg due to CHA2DS2-VASc of at least 2.  His Toprol was reduced to 50 mg daily.  Seen in follow-up 11/21/2019 doing well from cardiac perspective.  Reported no recurrent tachycardia.  He was not taking Eliquis due  to cost concerns.  Blood pressure was elevated he was noted to only be taking hydralazine twice daily.  He was recommended to use hydralazine 50 mg 3 times per day as prescribed, Eliquis resumed, and Toprol increased to 75 mg daily. Seen in follow up 12/06/19 noted to not be taking Hydralazine three times per day as prescribed. He was provided with 1 month samples of Eliquis and given assistance information for Eliquis.  Presents today for follow up. 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 He submitted his application to the [facility]. He did bring his Eliquis paperwork in today, but forgot his supporting financial documents. BP log from home with BP readings 144/74 - 185/90. Average BP 150s/70s. Denies recurrent palpitations.  Denies chest pain, pressure, tightness.  Denies shortness of breath at rest and dyspnea on exertion.  EKGs/Labs/Other Studies Reviewed:   The following studies were reviewed today: Echo 06/2018   1. The left ventricle has low normal systolic function, with an ejection  fraction of 50-55%. The cavity size was normal. Left ventricular diastolic  function could not be evaluated due to nondiagnostic images.   2. Evaluation of LVEF is limited due to tachycardia and poor apical  windows. Consider limited echo once atrial flutter/tachycardia has  resolved.   3. The right ventricle has normal systolic function. The cavity was  normal.  There is no increase in right ventricular wall thickness.   4. Left atrial size was not well visualized.   5. The mitral valve was not well visualized. Mild thickening of the  mitral valve leaflet.   6. The aortic valve is tricuspid. Mild thickening of the aortic valve.  Mild calcification of the aortic valve. Aortic valve regurgitation was not  assessed by color flow Doppler.   7. The interatrial septum was not well visualized.   EKG:  No EKG  today.  Recent Labs: 11/11/2019: ALT 33; Magnesium 1.6 12/06/2019: BUN 15; Creatinine, Ser 1.19; Hemoglobin 12.1; Platelets 433; Potassium 3.9; Sodium 139  Recent Lipid Panel No results found for: CHOL, TRIG, HDL, CHOLHDL, VLDL, LDLCALC, LDLDIRECT  Home Medications   Current Meds  Medication Sig  . metoprolol succinate (TOPROL-XL) 50 MG 24 hr tablet Take 1.5 tablets (75 mg total) by mouth daily with breakfast. Take with or immediately following a meal.  . [DISCONTINUED] hydrALAZINE (APRESOLINE) 50 MG tablet Take 1 tablet (50 mg total) by mouth 3 (three) times daily.    Review of Systems   All other systems reviewed and are otherwise negative except as noted above.  Physical Exam   VS:  BP (!) 170/80   Pulse 81   Ht 5\' 5"  (1.651 m)   Wt 178 lb (80.7 kg)   BMI 29.62 kg/m  , BMI Body mass index is 29.62 kg/m. GEN: Well nourished, well developed, in no acute distress. HEENT: normal. Neck: Supple, no JVD, carotid bruits, or masses. Cardiac: RRR, no murmurs, rubs, or gallops. No clubbing, cyanosis, edema.  Radials/DP/PT 2+ and equal bilaterally.  Respiratory:  Respirations regular and unlabored, clear to auscultation bilaterally. GI: Soft, nontender, nondistended, BS + x 4. MS: No deformity or atrophy. Skin: Warm and dry, no rash. Neuro:  Strength and sensation are intact. Psych: Normal affect.  Assessment & Plan    1. Paroxysmal atrial flutter - No recurrent palpitations. Continue Toprol 75 mg daily.  Continue Eliquis, as below.  2. Chronic anticoagulation-secondary to CHA2DS2-VASc of at least 2 (HTN, age) and history of atrial flutter. Provided with 2 week supply  11/21/19 and one month supply on 12/06/19. Denies bleeding complications. Dropped off his [facility] application yesterday. Brings Eliquis assistance paperwork, but forgot financial documents today - he was instructed to bring them back with his application. Will reach out to social work team for  additional assistance.    3. HTN -Blood pressure remains elevated. Reports taking Hydralazine TID. Increase to 100mg  TID. Continue Toprol 75 mg daily.   4. History of alcohol use -endorses continued cessation of alcohol use.  Discussed the importance of avoidance in the setting of atrial flutter.  5. Tobacco use - Presently using chewing tobacco.  Encouraged complete cessation and avoidance of nicotine products.  Recommend utilization of 1 800 quit now for resources.  Disposition: Follow up in 1 month(s) with Dr. 12/08/19 or APP  , NP 12/22/2019, 8:01 PM

## 2019-12-21 NOTE — Patient Instructions (Addendum)
Medication Instructions:  Your physician has recommended you make the following change in your medication:   CHANGE Hydralazine to 100mg  three times per day  *If you need a refill on your cardiac medications before your next appointment, please call your pharmacy*   Lab Work: None today  Testing/Procedures: None ordered today  Follow-Up: At [facility], you and your health needs are our priority.  As part of our continuing mission to provide you with exceptional heart care, we have created designated Provider Care Teams.  These Care Teams include your primary Cardiologist (physician) and Advanced Practice Providers (APPs -  Physician Assistants and Nurse Practitioners) who all work together to provide you with the care you need, when you need it.  We recommend signing up for the patient portal called "MyChart".  Sign up information is provided on this After Visit Summary.  MyChart is used to connect with patients for Virtual Visits (Telemedicine).  Patients are able to view lab/test results, encounter notes, upcoming appointments, etc.  Non-urgent messages can be sent to your provider as well.   To learn more about what you can do with MyChart, go to [facility].    Your next appointment:   1 month(s)  The format for your next appointment:   In Person  Provider:   You may see [email or web address], MD or one of the following Advanced Practice Providers on your designated Care Team:    Yvonne Kendall, NP  Nicolasa Ducking, PA-C  Eula Listen, PA-C  Cadence Marisue Ivan, Fransico Michael  [place], NP   Other Instructions   PLEASE CALL OUR OFFICE 2 DAYS PRIOR TO RUNNING OUT OF ELIQUIS. We are hopeful to have your patient assistance application completed prior.   Please bring your tax return or social security income document so we can send this in with the Eliquis patient assistance application.    Would recommend calling [facility] Monday to check on the  status of your application [phone])   One of our social workers with [facility] will be reaching out to you regarding medication assistance.

## 2019-12-22 ENCOUNTER — Encounter: Payer: Self-pay | Admitting: Family

## 2019-12-25 ENCOUNTER — Telehealth: Payer: Self-pay | Admitting: Licensed Clinical Social Worker

## 2019-12-25 NOTE — Telephone Encounter (Signed)
CSW consulted to reach out to patient to see if needing further assistance with medication cost concerns.  Per referral pt brought in Eliquis patient assistance app to cardiology office but was unsure how to obtain proof of income documents.  CSW reviewed with pt that the application actually does not require proof of income unless they request it from the patient so he would not need to obtain that before the application was submitted.  CSW also informed provider office of this and that they can proceed with submitting application.  CSW had also been informed that pt had submitted an application to [facility].  Pt states he has not heard anything back from this application but had been provided with a number to call which he planned on doing today or tomorrow to inquire about application status.  No further needs reported at this time  Burna Sis, LCSW Clinical Social Worker [facility] Desk#: [phone] Cell#: [phone]

## 2019-12-28 ENCOUNTER — Telehealth: Payer: Self-pay | Admitting: *Deleted

## 2019-12-28 NOTE — Telephone Encounter (Signed)
-----   Message from Alver Sorrow, NP sent at 12/28/2019  4:25 PM EDT ----- Regarding: Eliquis patient assistance Spoke with SW team regarding Eliquis patient assistance application. Per SW he can not include the financial documents at this time and they will request them if needed. He simply needs to fill out the 'estimated monthly or estimated yearly' income line with his best estimate. At clinic visit we thought that the additional income statements were needed so we gave him the paperwork back.  Both patient portion and provider portion are filled out. He can bring it to our office to fax or mail it himself, whichever he prefers.   Alver Sorrow, NP

## 2020-01-02 NOTE — Telephone Encounter (Signed)
Attempted to call the patient. No answer & no voice mail set up yet.

## 2020-01-03 NOTE — Telephone Encounter (Signed)
Called patient and he says he already mailed the patient assistance application last week to [facility]. I encouraged him to call to check on the status of the application later this week. He said he did yesterday and left a message. I encouraged him to continue to follow up and to call us if he needs any assistance.

## 2020-01-03 NOTE — Telephone Encounter (Signed)
Thank you!!  Vicki Chaffin S Oriana Horiuchi, NP  

## 2020-01-22 ENCOUNTER — Ambulatory Visit (INDEPENDENT_AMBULATORY_CARE_PROVIDER_SITE_OTHER): Payer: Medicare Other | Admitting: Family

## 2020-01-22 ENCOUNTER — Encounter: Payer: Self-pay | Admitting: Family

## 2020-01-22 ENCOUNTER — Other Ambulatory Visit: Payer: Self-pay

## 2020-01-22 VITALS — BP 146/70 | HR 78 | Ht 65.0 in | Wt 178.0 lb

## 2020-01-22 DIAGNOSIS — I10 Essential (primary) hypertension: Secondary | ICD-10-CM | POA: Diagnosis not present

## 2020-01-22 DIAGNOSIS — Z7901 Long term (current) use of anticoagulants: Secondary | ICD-10-CM | POA: Diagnosis not present

## 2020-01-22 DIAGNOSIS — I4892 Unspecified atrial flutter: Secondary | ICD-10-CM | POA: Diagnosis not present

## 2020-01-22 MED ORDER — AMLODIPINE BESYLATE 10 MG PO TABS: 10.0000 mg | ORAL_TABLET | Freq: Every day | ORAL | 5 refills | Status: DC

## 2020-01-22 MED ORDER — METOPROLOL SUCCINATE ER 50 MG PO TB24: 75.0000 mg | ORAL_TABLET | Freq: Every day | ORAL | 5 refills | Status: DC

## 2020-01-22 MED ORDER — HYDRALAZINE HCL 50 MG PO TABS: 50.0000 mg | ORAL_TABLET | Freq: Three times a day (TID) | ORAL | 5 refills | Status: DC

## 2020-01-22 MED ORDER — APIXABAN 5 MG PO TABS: 5.0000 mg | ORAL_TABLET | Freq: Two times a day (BID) | ORAL | 5 refills | Status: DC

## 2020-01-22 NOTE — Progress Notes (Signed)
Office Visit    Patient Name: Jacob Waters Date of Encounter: 01/22/2020  Primary Care Provider:  Patient, No Pcp Per Primary Cardiologist:  Yvonne Kendall, MD Electrophysiologist:  None   Chief Complaint    Jacob Waters is a 64 y.o. male with a hx of HTN, polysubstance abuse, paroxysmal atrial flutter presents today for follow up of HTN.   Past Medical History    Past Medical History:  Diagnosis Date  . Alcohol abuse   . Atrial flutter (HCC)   . Hypertension    Past Surgical History:  Procedure Laterality Date  . NO PAST SURGERIES      Allergies  No Known Allergies  History of Present Illness    Jacob Waters is a 64 y.o. male with a hx of HTN, polysubstance abuse, paroxysmal atrial flutter last seen 12/21/19  He was admitted April 2020 with left arm and flank numbness and paresthesias.  He was found to be in atrial flutter with variable block and frequently elevated ventricular rates.  Noted elevated blood alcohol at that time.  Placed on IV diltiazem and oral metoprolol for rate control.  He converted to NSR.  Seen in follow-up 06/29/2018 via telemedicine and doing well, no changes made.  Seen in follow-up 08/19/2018 noting to be taking his metoprolol tartrate once daily.  He was asymptomatic bradycardia noted.  He was transitioned to metoprolol succinate 100 mg daily.  Seen in [facility] ED 11/12/2019 nausea and emesis.  He reported running out of cardiac medications 2 weeks prior.  He had been drinking 24 packs of beers daily for the last 2 weeks.  EKG with atrial flutter with ventricular rates in the 150s and hypertension.  His home medications including Toprol resumed and then noted bradycardia in the 40s.  He was started on CIWA protocol as well as Eliquis 5 mg due to CHA2DS2-VASc of at least 2.  His Toprol was reduced to 50 mg daily.  Seen in follow-up 11/21/2019 doing well from cardiac perspective. He was not taking Eliquis due to cost concerns.  Blood pressure was  elevated he was noted to only be taking hydralazine twice daily.  He was recommended to use hydralazine 50 mg 3 times per day as prescribed, Eliquis resumed, and Toprol increased to 75 mg daily. Seen in follow up 12/06/19 noted to not be taking Hydralazine three times per day as prescribed. He was provided with 1 month samples of Eliquis and given assistance information for Eliquis.  Seen 12/21/19. BP elevated despite taking Hydralazine three times per day and dose increased to 100 mg 3 times daily.  Presents today for follow-up.  Tells me he did not feel well when taking 100 mg hydralazine was reduced back to milligrams.  Presently his blood pressure at home is in the 150s.  No chest pain, pressure, tightness.  No shortness of breath at rest or dyspnea on exertion.  He has run out of his Eliquis as he has not completed the patient assistance paperwork yet.  He wonders whether he could take aspirin we discussed that that would not be an effective anticoagulant.  EKGs/Labs/Other Studies Reviewed:   The following studies were reviewed today:  Echo 06/2018   1. The left ventricle has low normal systolic function, with an ejection  fraction of 50-55%. The cavity size was normal. Left ventricular diastolic  function could not be evaluated due to nondiagnostic images.   2. Evaluation of LVEF is limited due to tachycardia and poor apical  windows.  Consider limited echo once atrial flutter/tachycardia has  resolved.   3. The right ventricle has normal systolic function. The cavity was  normal. There is no increase in right ventricular wall thickness.   4. Left atrial size was not well visualized.   5. The mitral valve was not well visualized. Mild thickening of the  mitral valve leaflet.   6. The aortic valve is tricuspid. Mild thickening of the aortic valve.  Mild calcification of the aortic valve. Aortic valve regurgitation was not  assessed by color flow Doppler.   7. The interatrial septum was  not well visualized.   EKG:  No EKG today.  Recent Labs: 11/11/2019: ALT 33; Magnesium 1.6 12/06/2019: BUN 15; Creatinine, Ser 1.19; Hemoglobin 12.1; Platelets 433; Potassium 3.9; Sodium 139  Recent Lipid Panel No results found for: CHOL, TRIG, HDL, CHOLHDL, VLDL, LDLCALC, LDLDIRECT  Home Medications   Current Meds  Medication Sig  . amLODipine (NORVASC) 10 MG tablet Take 1 tablet (10 mg total) by mouth daily.  Marland Kitchen apixaban (ELIQUIS) 5 MG TABS tablet Take 1 tablet (5 mg total) by mouth 2 (two) times daily.  . hydrALAZINE (APRESOLINE) 100 MG tablet Take 1 tablet (100 mg total) by mouth 3 (three) times daily.  . metoprolol succinate (TOPROL-XL) 50 MG 24 hr tablet Take 1.5 tablets (75 mg total) by mouth daily with breakfast. Take with or immediately following a meal.    Review of Systems   All other systems reviewed and are otherwise negative except as noted above.  Physical Exam   VS:  BP (!) 168/76 (BP Location: Left Arm, Patient Position: Sitting, Cuff Size: Normal)   Pulse 78   Ht 5\' 5"  (1.651 m)   Wt 178 lb (80.7 kg)   SpO2 98%   BMI 29.62 kg/m  , BMI Body mass index is 29.62 kg/m. GEN: Well nourished, well developed, in no acute distress. HEENT: normal. Neck: Supple, no JVD, carotid bruits, or masses. Cardiac: RRR, no murmurs, rubs, or gallops. No clubbing, cyanosis, edema.  Radials/DP/PT 2+ and equal bilaterally.  Respiratory:  Respirations regular and unlabored, clear to auscultation bilaterally. GI: Soft, nontender, nondistended, BS + x 4. MS: No deformity or atrophy. Skin: Warm and dry, no rash. Neuro:  Strength and sensation are intact. Psych: Normal affect.  Assessment & Plan   1. Paroxysmal atrial flutter - No recurrent palpitations. Continue Toprol 75 mg daily.  Continue Eliquis, as below.  2. Chronic anticoagulationSsecondary to CHA2DS2-VASc of at least 2 (HTN, age) and history of atrial flutter. Provided with 2 week supply  11/21/19 and one month supply on  12/06/19.  Tells me he has run out of Eliquis.  He has not yet submitted application to medication management or to [employer] patient assistance foundation.  Both medication management clinic and [employer] patient assistance foundation paperwork was filled out and submitted today in clinic.  He was provided with 2-week supply of Eliquis in clinic today.    3. HTN -Blood pressure remains elevated.  Reports he did not feel well on 100 mg hydralazine and dose has been reduced to 50 mg 3 times daily.  He has run out of his amlodipine 10 mg daily and we will refill.  Will reassess blood pressure after reintroduction of amlodipine.  Future consideration include addition of ARB.  4. History of alcohol use -endorses continued cessation of alcohol use.  Discussed the importance of avoidance in the setting of atrial flutter.  5. Tobacco use - Presently using chewing  tobacco.  Encouraged complete cessation and avoidance of nicotine products.  Recommend utilization of 1 800 quit now for resources.  Disposition: Follow up in 2 month(s) with Dr. Okey Dupre or APP  Alver Sorrow, NP 01/22/2020, 11:38 AM

## 2020-01-22 NOTE — Patient Instructions (Addendum)
Medication Instructions:  Your physician has recommended you make the following change in your medication:   CONTINUE Hydralazine 50mg  three times per day  RESUME Amlodipine 10mg  daily  RESUME Eliquis 5mg  twice daily  *If you need a refill on your cardiac medications before your next appointment, please call your pharmacy*   Lab Work: None ordered today  Testing/Procedures: None ordered today.   Follow-Up: At [facility], you and your health needs are our priority.  As part of our continuing mission to provide you with exceptional heart care, we have created designated Provider Care Teams.  These Care Teams include your primary Cardiologist (physician) and Advanced Practice Providers (APPs -  Physician Assistants and Nurse Practitioners) who all work together to provide you with the care you need, when you need it.  We recommend signing up for the patient portal called "MyChart".  Sign up information is provided on this After Visit Summary.  MyChart is used to connect with patients for Virtual Visits (Telemedicine).  Patients are able to view lab/test results, encounter notes, upcoming appointments, etc.  Non-urgent messages can be sent to your provider as well.   To learn more about what you can do with MyChart, go to .    Your next appointment:   2 month(s)  The format for your next appointment:   In Person  Provider:   You may see , MD or one of the following Advanced Practice Providers on your designated Care Team:    [facility], NP  [email or web address], PA-C  Yvonne Kendall, PA-C  Cadence Nicolasa Ducking, Eula Listen  Marisue Ivan, NP   Medication Samples have been provided to the patient.  Drug name: Eliquis       Strength: 5 mg        Qty: 2 boxes  LOT: Fransico Michael  Exp.Date: 12/2021

## 2020-01-23 ENCOUNTER — Telehealth: Payer: Self-pay | Admitting: Family

## 2020-01-23 NOTE — Telephone Encounter (Signed)
[facility] called patient about setting up a shipment date and states patient was unaware of what she was talking about and got very defensive. [facility] pharmacy called here to ask if we can reach out to patient and instruct him to call back and discuss a shipment date

## 2020-01-24 NOTE — Telephone Encounter (Signed)
Spoke with the patient. Patient sts that he did receive the call from [facility] yesterday but he was not sue what it was about.  Adv the patient that [facility] is were he applied for patient assistance for Eliquis. Adv the patient that they need to verify his mailing address so that they can mail him his medication. Provided the patient the telephone number.[phone].  Adv him to call them back and provide the info they are needing. Patient verbalized understanding and voiced appreciation for the call.

## 2020-01-29 ENCOUNTER — Other Ambulatory Visit: Payer: Self-pay | Admitting: Family

## 2020-01-29 ENCOUNTER — Other Ambulatory Visit: Payer: Self-pay

## 2020-01-29 ENCOUNTER — Ambulatory Visit: Payer: Medicare Other | Admitting: Pharmacy Technician

## 2020-01-29 DIAGNOSIS — Z79899 Other long term (current) drug therapy: Secondary | ICD-10-CM

## 2020-01-29 DIAGNOSIS — I4892 Unspecified atrial flutter: Secondary | ICD-10-CM

## 2020-01-29 DIAGNOSIS — I10 Essential (primary) hypertension: Secondary | ICD-10-CM

## 2020-01-29 MED ORDER — AMLODIPINE BESYLATE 10 MG PO TABS: 10.0000 mg | ORAL_TABLET | Freq: Every day | ORAL | 1 refills | Status: DC

## 2020-01-29 MED ORDER — HYDRALAZINE HCL 50 MG PO TABS: 50.0000 mg | ORAL_TABLET | Freq: Three times a day (TID) | ORAL | 1 refills | Status: DC

## 2020-01-29 MED ORDER — METOPROLOL SUCCINATE ER 50 MG PO TB24: 75.0000 mg | ORAL_TABLET | Freq: Every day | ORAL | 1 refills | Status: DC

## 2020-01-29 NOTE — Progress Notes (Signed)
Patient will have a Medicare Part D plan beginning 03/09/20.  [facility] providing medication assistance for Hydralazine, Metoprolol and Amlodipine during the interim.  Gillian Shields at [facility] prepared PAP application for Eliquis and submitted to [employer].  Patient stated that he has received a 90 day supply of Eliquis that was mailed to his home from [place].    Provided patient information about how to be screened for Medicare Savings and L.I.S.    Sherilyn Dacosta Care Manager [facility]

## 2020-01-29 NOTE — Progress Notes (Signed)
Received notice from [facility] that they are able to assist Mr. Swim through 03/09/20 with Hydralazine, Metoprolol, and Amlodipine. Rx sent to [facility].   Alver Sorrow, NP

## 2020-03-28 ENCOUNTER — Ambulatory Visit (INDEPENDENT_AMBULATORY_CARE_PROVIDER_SITE_OTHER): Payer: Medicare Other | Admitting: Internal Medicine

## 2020-03-28 ENCOUNTER — Other Ambulatory Visit: Payer: Self-pay

## 2020-03-28 ENCOUNTER — Encounter: Payer: Self-pay | Admitting: Internal Medicine

## 2020-03-28 VITALS — BP 168/80 | HR 64 | Ht 65.0 in | Wt 180.0 lb

## 2020-03-28 DIAGNOSIS — Z7901 Long term (current) use of anticoagulants: Secondary | ICD-10-CM

## 2020-03-28 DIAGNOSIS — I10 Essential (primary) hypertension: Secondary | ICD-10-CM | POA: Diagnosis not present

## 2020-03-28 DIAGNOSIS — I4892 Unspecified atrial flutter: Secondary | ICD-10-CM

## 2020-03-28 DIAGNOSIS — F1011 Alcohol abuse, in remission: Secondary | ICD-10-CM

## 2020-03-28 MED ORDER — APIXABAN 5 MG PO TABS: 5.0000 mg | ORAL_TABLET | Freq: Two times a day (BID) | ORAL | 2 refills | Status: DC

## 2020-03-28 MED ORDER — HYDROCHLOROTHIAZIDE 25 MG PO TABS: 25.0000 mg | ORAL_TABLET | Freq: Every day | ORAL | 2 refills | Status: DC

## 2020-03-28 MED ORDER — METOPROLOL SUCCINATE ER 50 MG PO TB24: 75.0000 mg | ORAL_TABLET | Freq: Every day | ORAL | 1 refills | Status: DC

## 2020-03-28 MED ORDER — AMLODIPINE BESYLATE 10 MG PO TABS: 10.0000 mg | ORAL_TABLET | Freq: Every day | ORAL | 1 refills | Status: DC

## 2020-03-28 MED ORDER — HYDRALAZINE HCL 50 MG PO TABS: 50.0000 mg | ORAL_TABLET | Freq: Three times a day (TID) | ORAL | 1 refills | Status: DC

## 2020-03-28 NOTE — Progress Notes (Signed)
Follow-up Outpatient Visit Date: 03/28/2020  Primary Care Provider: Patient, No Pcp Per No address on file  Chief Complaint: Follow-up hypertension and atrial flutter  HPI:  Jacob Waters is a 65 y.o. male with history of atrial flutter, HTN, and polysubstance abuse, who presents for follow-up of a flutter.  He was last seen in our office in 01/2020 by Gillian Shields, NP, at which time he reported not feeling well while taking hydralazine 100 mg 3 times daily for his blood pressure.  He also noted having run out of apixaban, as he was unable to afford the medication and had not yet completed the patient assistance paperwork.  He was also out of amlodipine.  He was restarted on amlodipine and continued on hydralazine 50 mg 3 times daily, metoprolol succinate 75 mg daily, and apixaban 5 mg twice daily.  Today, Mr. Tuccillo reports feeling well.  Home blood pressures have improved but are still elevated.  He remembers 156/70 as a recent home BP measurement.  He denies chest pain, shortness of breath, palpitations, lightheadedness, edema, and bleeding.  He has been compliant with his medications.  --------------------------------------------------------------------------------------------------  Past Medical History:  Diagnosis Date  . Alcohol abuse   . Atrial flutter (HCC)   . Hypertension    Past Surgical History:  Procedure Laterality Date  . NO PAST SURGERIES      Current Meds  Medication Sig  . [DISCONTINUED] amLODipine (NORVASC) 10 MG tablet Take 1 tablet (10 mg total) by mouth daily.  . [DISCONTINUED] apixaban (ELIQUIS) 5 MG TABS tablet Take 1 tablet (5 mg total) by mouth 2 (two) times daily.  . [DISCONTINUED] hydrALAZINE (APRESOLINE) 50 MG tablet Take 1 tablet (50 mg total) by mouth 3 (three) times daily.  . [DISCONTINUED] hydrochlorothiazide (HYDRODIURIL) 25 MG tablet Take 1 tablet (25 mg total) by mouth daily.  . [DISCONTINUED] metoprolol succinate (TOPROL-XL) 50 MG 24 hr tablet  Take 1.5 tablets (75 mg total) by mouth daily with breakfast. Take with or immediately following a meal.    Allergies: Patient has no known allergies.  Social History   Tobacco Use  . Smoking status: Never Smoker  . Smokeless tobacco: Current User    Types: Chew  Vaping Use  . Vaping Use: Never used  Substance Use Topics  . Alcohol use: Not Currently    Alcohol/week: 24.0 standard drinks    Types: 24 Cans of beer per week  . Drug use: Never    Family History  Problem Relation Age of Onset  . Cancer Mother   . Hypertension Mother   . Cancer Father   . Hypertension Father   . Hypertension Sister     Review of Systems: A 12-system review of systems was performed and was negative except as noted in the HPI.  --------------------------------------------------------------------------------------------------  Physical Exam: BP (!) 168/80 (BP Location: Left Arm, Patient Position: Sitting, Cuff Size: Normal)   Pulse 64   Ht 5\' 5"  (1.651 m)   Wt 180 lb (81.6 kg)   SpO2 99%   BMI 29.95 kg/m   General:  NAD. Neck: No JVD or HJR. Lungs: Clear to auscultation bilaterally without wheezes or crackles. Heart: Regular rate and rhythm without murmurs, rubs, or gallops. Abdomen: Soft, nontender, nondistended. Extremities: No lower extremity edema.  EKG:  Normal sinus rhythm with poor R wave progression, most likely due to lead placement.  No significant change from prior tracing on 12/06/2019.  Lab Results  Component Value Date   WBC 4.1 12/06/2019  HGB 12.1 (L) 12/06/2019   HCT 36.0 (L) 12/06/2019   MCV 89 12/06/2019   PLT 433 12/06/2019    Lab Results  Component Value Date   NA 142 03/28/2020   K 4.4 03/28/2020   CL 105 03/28/2020   CO2 25 03/28/2020   BUN 15 03/28/2020   CREATININE 1.11 03/28/2020   GLUCOSE 91 03/28/2020   ALT 33 11/11/2019    No results found for: CHOL, HDL, LDLCALC, LDLDIRECT, TRIG,  CHOLHDL  --------------------------------------------------------------------------------------------------  ASSESSMENT AND PLAN: Atrial flutter: Mr. Spradley appears to be maintaining sinus rhythm.  Continue current doses of metoprolol and apixaban.  Uncontrolled hypertension: BP still not well controlled.  I have recommended addition of HCTZ 25 mg daily to current regimen of amlodipine, hydralazine, and metoprolol.  I would favor trying to get him off hydralazine to simply his regimen.  Transitioning to an ACEI/ARB would be my next choice.  If BP remains difficult to control, secondary HTN w/u would need to be pursued.  We will check a BMP today and again in 1 month when he returns for follow-up.  Alcohol abuse: Mr. Grill reports that he has quit drinking.  Follow-up: Return to clinic in 1 month.  Yvonne Kendall, MD 03/29/2020 5:27 PM

## 2020-03-28 NOTE — Patient Instructions (Signed)
Medication Instructions:  Your physician has recommended you make the following change in your medication:  1- START Hydrochlorothiazide 25 mg by mouth once a day.  *If you need a refill on your cardiac medications before your next appointment, please call your pharmacy*   Lab Work: Your physician recommends that you return for lab work in: TODAY - BMET.   If you have labs (blood work) drawn today and your tests are completely normal, you will receive your results only by: Marland Kitchen MyChart Message (if you have MyChart) OR . A paper copy in the mail If you have any lab test that is abnormal or we need to change your treatment, we will call you to review the results.  Testing/Procedures: none  Follow-Up: At [facility], you and your health needs are our priority.  As part of our continuing mission to provide you with exceptional heart care, we have created designated Provider Care Teams.  These Care Teams include your primary Cardiologist (physician) and Advanced Practice Providers (APPs -  Physician Assistants and Nurse Practitioners) who all work together to provide you with the care you need, when you need it.  We recommend signing up for the patient portal called "MyChart".  Sign up information is provided on this After Visit Summary.  MyChart is used to connect with patients for Virtual Visits (Telemedicine).  Patients are able to view lab/test results, encounter notes, upcoming appointments, etc.  Non-urgent messages can be sent to your provider as well.   To learn more about what you can do with MyChart, go to [email or web address].    Your next appointment:   1 month(s) - seen Dan Humphreys and Michaelle Birks in the past.   The format for your next appointment:   In Person  Provider:   You will see one of the following Advanced Practice Providers on your designated Care Team:    Nicolasa Ducking, NP  Eula Listen, PA-C  Marisue Ivan, PA-C  Cadence Troutdale, [place]  Gillian Shields, NP

## 2020-03-29 ENCOUNTER — Encounter: Payer: Self-pay | Admitting: *Deleted

## 2020-03-29 ENCOUNTER — Telehealth: Payer: Self-pay | Admitting: Internal Medicine

## 2020-03-29 ENCOUNTER — Encounter: Payer: Self-pay | Admitting: Internal Medicine

## 2020-03-29 DIAGNOSIS — I4892 Unspecified atrial flutter: Secondary | ICD-10-CM

## 2020-03-29 DIAGNOSIS — Z7901 Long term (current) use of anticoagulants: Secondary | ICD-10-CM

## 2020-03-29 DIAGNOSIS — I10 Essential (primary) hypertension: Secondary | ICD-10-CM

## 2020-03-29 LAB — BASIC METABOLIC PANEL
BUN/Creatinine Ratio: 14 (ref 10–24)
BUN: 15 mg/dL (ref 8–27)
CO2: 25 mmol/L (ref 20–29)
Calcium: 9.5 mg/dL (ref 8.6–10.2)
Chloride: 105 mmol/L (ref 96–106)
Creatinine, Ser: 1.11 mg/dL (ref 0.76–1.27)
GFR calc Af Amer: 80 mL/min/{1.73_m2} (ref >59)
GFR calc non Af Amer: 69 mL/min/{1.73_m2} (ref >59)
Glucose: 91 mg/dL (ref 65–99)
Potassium: 4.4 mmol/L (ref 3.5–5.2)
Sodium: 142 mmol/L (ref 134–144)

## 2020-03-29 MED ORDER — HYDRALAZINE HCL 50 MG PO TABS: 50.0000 mg | ORAL_TABLET | Freq: Three times a day (TID) | ORAL | 3 refills | Status: DC

## 2020-03-29 MED ORDER — HYDROCHLOROTHIAZIDE 25 MG PO TABS: 25.0000 mg | ORAL_TABLET | Freq: Every day | ORAL | 3 refills | Status: DC

## 2020-03-29 MED ORDER — AMLODIPINE BESYLATE 10 MG PO TABS: 10.0000 mg | ORAL_TABLET | Freq: Every day | ORAL | 3 refills | Status: DC

## 2020-03-29 MED ORDER — APIXABAN 5 MG PO TABS: 5.0000 mg | ORAL_TABLET | Freq: Two times a day (BID) | ORAL | 3 refills | Status: DC

## 2020-03-29 MED ORDER — METOPROLOL SUCCINATE ER 50 MG PO TB24: 75.0000 mg | ORAL_TABLET | Freq: Every day | ORAL | 1 refills | Status: DC

## 2020-03-29 NOTE — Telephone Encounter (Signed)
Patient calling in stating medication should not be sent to [facility] but now to medication management. All prescriptions will need to be resent

## 2020-04-28 NOTE — Progress Notes (Signed)
Office Visit    Patient Name: Jacob Waters Date of Encounter: 05/02/2020  Primary Care Provider:  Patient, No Pcp Per Primary Cardiologist:  Yvonne Kendall, MD  Chief Complaint    Chief Complaint  Patient presents with  . Follow-up    1 month    65 year old male with history of hypertension, h/o polysubstance use including alcohol use, paroxysmal atrial flutter (06/2018), and seen today for 1 month follow-up of BP.  Past Medical History    Past Medical History:  Diagnosis Date  . Alcohol abuse   . Atrial flutter (HCC)   . Hypertension    Past Surgical History:  Procedure Laterality Date  . NO PAST SURGERIES      Allergies  No Known Allergies  History of Present Illness    Jacob Waters is a 65 y.o. male with PMH as above.     He was admitted 06/2018 with left arm and flank numbness and paresthesias.  The symptoms persisted; therefore, he presented to [facility] ED.  He was found to be in atrial flutter with variable block and frequently elevated ventricular rates.  He was noted to have elevated blood alcohol level at that time.  He was placed on IV diltiazem and oral metoprolol for rate control.  He subsequently converted to NSR on metoprolol tartrate 100 mg twice daily and ASA 81 mg daily, as well as amlodipine 10 mg daily for blood pressure control.  He was seen after discharge via telemedicine 06/29/2018 and overall doing well from a cardiac standpoint.  He reported getting more tired than usual when compared with a few years ago.  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  He was not monitoring his blood pressure at home but hoped to use his sisters BP cuff in the near future.  No medication changes.  When seen in follow-up 08/19/2018, he was only taking his metoprolol tartrate once daily.  He was asymptomatic and bradycardia noted on that day's visit.  He was transitioned to metoprolol succinate 100 mg daily.  Alcohol cessation was advised.  He was  continued on Toprol and amlodipine.  On 11/12/2019, he presented to [facility] and EKG showed atrial flutter with ventricular rates into the 150s.  He had run out of his medications. He was hypertensive.  He was restarted on blood pressure medications, including Toprol-XL, later reduced for bradycardia.  He was started on CIWA protocol for alcohol use at 24 pk of beers daily x2 weeks.  He was started on anticoagulation with Eliquis 5 mg twice daily due to CHA2DS2VASc score of at least 2.  It was noted that he had a history of medication noncompliance; therefore, he may benefit from once daily Xarelto to be reassessed in clinic.  Toprol-XL was decreased to 50 mg daily.  Seen September and October 2021.  Initially, he was not on Eliquis due to cost.  Samples and coupon were provided for him.  Medication management was also contacted for assistance with hydralazine, metoprolol, and amlodipine.  Hydralazine was increased to 100 mg 3 times daily for continued elevated pressure at visits with patient report that he did not feel well on this dose.  He was able to find a home BP cuff with SBP 140s to 180s.  He quit EtOH.  He was still using tobacco/chewing tobacco.  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   When seen by his primary cardiologist 03/28/2020, BP was still elevated with  recommendation for addition of HCTZ daily to regimen of amlodipine, hydralazine, and metoprolol. It was noted that preference was to get him off of hydralazine to simplify his regimen.  Transitioning to an ACE/ARB would be his next choice.  If BP remained difficult to control, secondary hypertension work-up should be pursued.  It was noted he would need a BMET again in 1 month for follow-up.  Today, 05/01/2020, he returns to clinic and is feeling well from a cardiac standpoint.  He denies any chest pain, shortness of breath, dyspnea, racing heart rate, palpitations, presyncope, syncope, or signs or symptoms of volume overload.   He denies any signs or symptoms of bleeding.  He reports that he has been working steadily without any exertional symptoms.  He does not, however, have a regular exercise routine.  He reports medication compliance.  He has not had any difficulty obtaining his medications since able to obtain medication management.  He reports that he has been checking his blood pressure at home with a BP cuff but forgot to bring it with him to clinic today.  He recalls home SBP 140s to 150s with DBP 60s to 70s.  We discussed recommended guideline BP.  BP today elevated at 152/74 with patient report that he took his blood pressure medications this morning at 8 AM.  We discussed Dr. Serita Kyle recommendation to transition him off of hydralazine with patient preference for this change, given he would also prefer to transition off a medication that is taken 3 times per day. He denies any alcohol use.  He is still using tobacco.    Home Medications    Prior to Admission medications   Medication Sig Start Date End Date Taking? Authorizing Provider  amLODipine (NORVASC) 10 MG tablet Take 1 tablet (10 mg total) by mouth daily. 11/14/19 12/14/19  Shahmehdi, Gemma Payor, MD  apixaban (ELIQUIS) 5 MG TABS tablet Take 1 tablet (5 mg total) by mouth 2 (two) times daily. 11/14/19 12/14/19  ShahmehdiGemma Payor, MD  aspirin EC 81 MG tablet Take 1 tablet (81 mg total) by mouth daily. Patient not taking: Reported on 11/12/2019 06/22/18   Milagros Loll, MD  hydrALAZINE (APRESOLINE) 50 MG tablet Take 1 tablet (50 mg total) by mouth every 8 (eight) hours. 11/14/19 12/14/19  Kendell Bane, MD  metoprolol succinate (TOPROL-XL) 50 MG 24 hr tablet Take 1 tablet (50 mg total) by mouth daily with breakfast. Take with or immediately following a meal. 11/15/19 12/15/19  Shahmehdi, Gemma Payor, MD  Multiple Vitamin (MULTIVITAMIN WITH MINERALS) TABS tablet Take 1 tablet by mouth daily. 11/15/19 12/15/19  Kendell Bane, MD    Review of Systems    He denies chest pain,  palpitations, dyspnea, pnd, orthopnea, n, v, dizziness, syncope, edema, weight gain, or early satiety. .   All other systems reviewed and are otherwise negative except as noted above.  Physical Exam    VS:  BP (!) 152/74   Pulse 69   Ht 5\' 5"  (1.651 m)   Wt 180 lb (81.6 kg)   BMI 29.95 kg/m  , BMI Body mass index is 29.95 kg/m. GEN: Well nourished, well developed, in no acute distress. HEENT: normal. Neck: Supple, no JVD, carotid bruits, or masses. Cardiac: RRR, no murmurs, rubs, or gallops. No clubbing, cyanosis, edema.  Radials/DP/PT 2+ and equal bilaterally.  Respiratory:  Respirations regular and unlabored, clear to auscultation bilaterally. GI: Soft, nontender, nondistended, BS + x 4. MS: slight tremor noted. no deformity or atrophy. Skin:  warm and dry, no rash. Neuro:  Strength and sensation are intact. Psych: Normal affect.  Accessory Clinical Findings    ECG personally reviewed by me today -NSR, 69 bpm, nonspecific ST/T changes- no acute changes.  VITALS Reviewed today   Temp Readings from Last 3 Encounters:  11/14/19 98.7 F (37.1 C)  06/22/18 98.9 F (37.2 C) (Axillary)   BP Readings from Last 3 Encounters:  05/01/20 (!) 152/74  03/28/20 (!) 168/80  01/22/20 (!) 146/70   Pulse Readings from Last 3 Encounters:  05/01/20 69  03/28/20 64  01/22/20 78    Wt Readings from Last 3 Encounters:  05/01/20 180 lb (81.6 kg)  03/28/20 180 lb (81.6 kg)  01/22/20 178 lb (80.7 kg)     LABS  reviewed today   Lab Results  Component Value Date   WBC 4.1 12/06/2019   HGB 12.1 (L) 12/06/2019   HCT 36.0 (L) 12/06/2019   MCV 89 12/06/2019   PLT 433 12/06/2019   Lab Results  Component Value Date   CREATININE 1.11 03/28/2020   BUN 15 03/28/2020   NA 142 03/28/2020   K 4.4 03/28/2020   CL 105 03/28/2020   CO2 25 03/28/2020   Lab Results  Component Value Date   ALT 33 11/11/2019   AST 64 (H) 11/11/2019   ALKPHOS 74 11/11/2019   BILITOT 1.2 11/11/2019   No  results found for: CHOL, HDL, LDLCALC, LDLDIRECT, TRIG, CHOLHDL  Lab Results  Component Value Date   HGBA1C 6.0 (H) 06/21/2018   Lab Results  Component Value Date   TSH 0.751 06/21/2018     STUDIES/PROCEDURES reviewed today   TTE (06/21/2018): Normal LV size.  LVEF 50-55%.  Normal RV size and function.  No significant valvular abnormality seen, though evaluation was limited by cardiac and suboptimal acoustic windows.  Assessment & Plan    Paroxysmal atrial flutter with RVR --NSR with controlled rate today.  He reports that he has not had any racing heart rate or palpitations. He has been taking his Toprol 75 mg at breakfast / 8AM and tolerating it well.  Rate today well controlled in NSR. He continues Eliquis 5 mg twice daily with CHA2DS2VASc score of at least 2 (HTN, age).  No signs or symptoms consistent with bleeding.    Essential hypertension, poorly controlled --Clinic BP still sub-optimal. Home BP cuff obtained with estimated home SBP 140-150 and DBP 60-70.  He continues on hydralazine 50 TID, HCTZ 25mg  daily, amlodipine 10mg  daily, and Toprol 75mg  daily.  We will check a BMET today with plan to transition to valsartan or olmesartan for more optimal BP control pending these labs. As previously noted, if BP remains difficult to control, secondary HTN workup recommended.   History of alcohol use --No further alcohol use. Tobacco cessation discussed.   Medication changes: Pending labs, discontinue hydralazine and transition to ARB with close monitoring ot BP. Labs ordered: BMET now and within 1-2 weeks of change.   Studies / Imaging ordered: None. Future considerations: Secondary HTN workup if needed - defer for now. Disposition: 3 weeks    , PA-C 05/01/2020

## 2020-05-01 ENCOUNTER — Ambulatory Visit (INDEPENDENT_AMBULATORY_CARE_PROVIDER_SITE_OTHER): Payer: Medicare Other | Admitting: Physician Assistant

## 2020-05-01 ENCOUNTER — Encounter: Payer: Self-pay | Admitting: Physician Assistant

## 2020-05-01 ENCOUNTER — Other Ambulatory Visit: Payer: Self-pay

## 2020-05-01 VITALS — BP 152/74 | HR 69 | Ht 65.0 in | Wt 180.0 lb

## 2020-05-01 DIAGNOSIS — Z7901 Long term (current) use of anticoagulants: Secondary | ICD-10-CM

## 2020-05-01 DIAGNOSIS — I4892 Unspecified atrial flutter: Secondary | ICD-10-CM

## 2020-05-01 DIAGNOSIS — I10 Essential (primary) hypertension: Secondary | ICD-10-CM

## 2020-05-01 DIAGNOSIS — Z79899 Other long term (current) drug therapy: Secondary | ICD-10-CM | POA: Diagnosis not present

## 2020-05-01 DIAGNOSIS — Z87898 Personal history of other specified conditions: Secondary | ICD-10-CM

## 2020-05-01 DIAGNOSIS — Z720 Tobacco use: Secondary | ICD-10-CM

## 2020-05-01 NOTE — Patient Instructions (Addendum)
Medication Instructions:  Your physician recommends that you continue on your current medications as directed. Please refer to the Current Medication list given to you today.  *If you need a refill on your cardiac medications before your next appointment, please call your pharmacy*   Lab Work: Your physician recommends that you have lab work TODAY: Bmet  If you have labs (blood work) drawn today and your tests are completely normal, you will receive your results only by: Marland Kitchen MyChart Message (if you have MyChart) OR . A paper copy in the mail If you have any lab test that is abnormal or we need to change your treatment, we will call you to review the results.   Testing/Procedures: None ordered   Follow-Up: At [facility], you and your health needs are our priority.  As part of our continuing mission to provide you with exceptional heart care, we have created designated Provider Care Teams.  These Care Teams include your primary Cardiologist (physician) and Advanced Practice Providers (APPs -  Physician Assistants and Nurse Practitioners) who all work together to provide you with the care you need, when you need it.  We recommend signing up for the patient portal called "MyChart".  Sign up information is provided on this After Visit Summary.  MyChart is used to connect with patients for Virtual Visits (Telemedicine).  Patients are able to view lab/test results, encounter notes, upcoming appointments, etc.  Non-urgent messages can be sent to your provider as well.   To learn more about what you can do with MyChart, go to [email or web address].    Your next appointment:   3 week(s)  The format for your next appointment:   In Person  Provider:   You may see Yvonne Kendall, MD or one of the following Advanced Practice Providers on your designated Care Team:    Nicolasa Ducking, NP  Eula Listen, PA-C  Marisue Ivan, PA-C  Cadence Fransico Michael, [place]  Gillian Shields, NP    Other  Instructions  Let us know if your top number of your blood pressure is running above 160 consistently.

## 2020-05-02 LAB — BASIC METABOLIC PANEL
BUN/Creatinine Ratio: 15 (ref 10–24)
BUN: 19 mg/dL (ref 8–27)
CO2: 20 mmol/L (ref 20–29)
Calcium: 9.7 mg/dL (ref 8.6–10.2)
Chloride: 101 mmol/L (ref 96–106)
Creatinine, Ser: 1.25 mg/dL (ref 0.76–1.27)
GFR calc Af Amer: 69 mL/min/{1.73_m2} (ref >59)
GFR calc non Af Amer: 60 mL/min/{1.73_m2} (ref >59)
Glucose: 105 mg/dL — ABNORMAL HIGH (ref 65–99)
Potassium: 4.2 mmol/L (ref 3.5–5.2)
Sodium: 141 mmol/L (ref 134–144)

## 2020-05-07 ENCOUNTER — Telehealth: Payer: Self-pay | Admitting: *Deleted

## 2020-05-07 DIAGNOSIS — I10 Essential (primary) hypertension: Secondary | ICD-10-CM

## 2020-05-07 DIAGNOSIS — I4892 Unspecified atrial flutter: Secondary | ICD-10-CM

## 2020-05-07 MED ORDER — OLMESARTAN-AMLODIPINE-HCTZ 40-10-25 MG PO TABS: 1.0000 | ORAL_TABLET | Freq: Every day | ORAL | 11 refills | Status: DC

## 2020-05-07 NOTE — Telephone Encounter (Signed)
-----   Message from Lennon Alstrom, [place] sent at 05/05/2020 10:42 PM EST ----- Labs show  --Renal function with slight bump from previous labs but still within baseline range on review of all previous labs. --Potassium at goal.  Recommendations: Discontinue hydralazine and start a combination medication that also includes olmesartan as previously discussed. They prescribe olmesartan in a combination pill with his amlodipine and HCTZ; therefore, he can also discontinue HCTZ and amlodipine with start of this medication.  ------- (1) STOP these 3 medications: hydralazine, amlodipine, hydrochlorothiazide.   (2) START combination medication: olmesartan medoxomil / amlodipine / hydrochlorothiazide (Tribenzor) at dose 40mg  / 10mg  / 25mg  once daily.  (3) Monitor BP closely during this time. If BP consistently >130/80, call the office as we may need to add back hydralazine at that time.  (4) REPEAT BMET in 1 week to recheck after start of the above medication.   Let me know if questions.

## 2020-05-07 NOTE — Telephone Encounter (Signed)
Reviewed results and recommendations with patient and confirmed pharmacy. Reviewed medications to discontinue, reviewed new medication that is combination, and he verbalized understanding. Also discussed labs in one week that are due after starting new medication. Instructed him to go to [facility] entrance to have repeat labs done at the end of one week with new medication. He verbalized understanding of all instructions, agreement with plan, and had no further questions at this time.

## 2020-05-22 ENCOUNTER — Ambulatory Visit (INDEPENDENT_AMBULATORY_CARE_PROVIDER_SITE_OTHER): Payer: Medicare Other | Admitting: Medical

## 2020-05-22 ENCOUNTER — Encounter: Payer: Self-pay | Admitting: Medical

## 2020-05-22 ENCOUNTER — Other Ambulatory Visit: Payer: Self-pay

## 2020-05-22 VITALS — BP 162/72 | HR 68 | Ht 65.0 in | Wt 177.0 lb

## 2020-05-22 DIAGNOSIS — Z87898 Personal history of other specified conditions: Secondary | ICD-10-CM | POA: Diagnosis not present

## 2020-05-22 DIAGNOSIS — I10 Essential (primary) hypertension: Secondary | ICD-10-CM | POA: Diagnosis not present

## 2020-05-22 DIAGNOSIS — I4892 Unspecified atrial flutter: Secondary | ICD-10-CM | POA: Diagnosis not present

## 2020-05-22 DIAGNOSIS — Z7901 Long term (current) use of anticoagulants: Secondary | ICD-10-CM

## 2020-05-22 MED ORDER — CARVEDILOL 3.125 MG PO TABS: 3.1250 mg | ORAL_TABLET | Freq: Two times a day (BID) | ORAL | 3 refills | Status: DC

## 2020-05-22 MED ORDER — APIXABAN 5 MG PO TABS: 5.0000 mg | ORAL_TABLET | Freq: Two times a day (BID) | ORAL | 3 refills | Status: DC

## 2020-05-22 NOTE — Progress Notes (Signed)
Cardiology Office Note:    Date:  05/22/2020   ID:  Jacob Waters, DOB 11/13/1955, MRN [id number]  PCP:  Patient, No Pcp Per  [facility] Cardiologist:  Yvonne Kendall, MD  [facility] Electrophysiologist:  None   Referring MD: No ref. provider found   Chief Complaint: 3 week follow-up for BP  History of Present Illness:    Jacob Waters is a 65 y.o. male with a hx of HTN, h/o of polysubstance use including alcohol use, paroxysmal aflutter (06/2018).  The patient was admitted 06/2018 with left arm flank numbness and paresthesias. He was found to have aflutter with variable block and frequently elevated ventricular rates. Blood alcohol was raised. He converted to SR and was started on metoprolol, ASA, and amlodipine for bp control.   When he was seen in the clinic 08/2018 and was taking his metoprolol succinate 100mg  daily. He was asymptomatic and bradycardia. He was transitioned to metoprolol succinate 100mg  daily.    On 11/2019 the patient presented to [facility] and was found to be in aflutter with RVR and hypertensive. He had run out of his medications. He was restarted on medications. Toprol reduced for bradycardia. He was started on Eliquis 5mg  BID for CHADSVASC of at least 2. Seen in September and October 2021 and was not on Eliquis due to cost. Hydralazine was increased to 100mg  TID. He quit ETOH but was still smoking.   He was seen by cardiologist 03/28/2020 and blood pressure still elevated with recommendation for addition of hydrochlorothiazide along with amlodipine, hydralazine, metoprolol.  Preference was to get him off hydralazine.  He was last seen 05/01/2020 blood pressure was elevated.  He was ultimately transitioned to combination medication olmesartan/amlodipine/hydrochlorothiazide 40mg /10/mg25mg .  Today, BP is high, 162/72. He just started his new medication on Friday. Pills were expensive and that's why it took some time to start taking them. BP this morning was 143/62 and  heart rate in the 60s. Generally BP running around 140s/60s at home. Denies chest pain, sob, LEE, orthopnea, lightheadedness, dizziness. In the midst of stopping multiple medications he thought he was instructed to stop Eliquis and Toprol as well, however review of notes does not indicate this. He is in SR today, Hr 68. Denies recurrent alcohol use.   Past Medical History:  Diagnosis Date  . Alcohol abuse   . Atrial flutter (HCC)   . Hypertension     Past Surgical History:  Procedure Laterality Date  . NO PAST SURGERIES      Current Medications: Current Meds  Medication Sig  . Olmesartan-amLODIPine-HCTZ 40-10-25 MG TABS Take 1 tablet by mouth daily.     Allergies:   Patient has no known allergies.   Social History   Socioeconomic History  . Marital status: Single    Spouse name: Not on file  . Number of children: Not on file  . Years of education: Not on file  . Highest education level: Not on file  Occupational History  . Not on file  Tobacco Use  . Smoking status: Never Smoker  . Smokeless tobacco: Current User    Types: Chew  Vaping Use  . Vaping Use: Never used  Substance and Sexual Activity  . Alcohol use: Not Currently    Alcohol/week: 24.0 standard drinks    Types: 24 Cans of beer per week  . Drug use: Never  . Sexual activity: Not on file  Other Topics Concern  . Not on file  Social History Narrative  . Not on file  Social Determinants of Health   Financial Resource Strain: Not on file  Food Insecurity: Not on file  Transportation Needs: Not on file  Physical Activity: Not on file  Stress: Not on file  Social Connections: Not on file     Family History: The patient's family history includes Cancer in his father and mother; Hypertension in his father, mother, and sister.  ROS:   Please see the history of present illness.     All other systems reviewed and are negative.  EKGs/Labs/Other Studies Reviewed:    The following studies were reviewed  today:  Echo 2020  1. The left ventricle has low normal systolic function, with an ejection  fraction of 50-55%. The cavity size was normal. Left ventricular diastolic  function could not be evaluated due to nondiagnostic images.  2. Evaluation of LVEF is limited due to tachycardia and poor apical  windows. Consider limited echo once atrial flutter/tachycardia has  resolved.  3. The right ventricle has normal systolic function. The cavity was  normal. There is no increase in right ventricular wall thickness.  4. Left atrial size was not well visualized.  5. The mitral valve was not well visualized. Mild thickening of the  mitral valve leaflet.  6. The aortic valve is tricuspid. Mild thickening of the aortic valve.  Mild calcification of the aortic valve. Aortic valve regurgitation was not  assessed by color flow Doppler.  7. The interatrial septum was not well visualized.   EKG:  EKG is  ordered today.  The ekg ordered today demonstrates SR 68bpm, nonspecific T wave changes  Recent Labs: 11/11/2019: ALT 33; Magnesium 1.6 12/06/2019: Hemoglobin 12.1; Platelets 433 05/01/2020: BUN 19; Creatinine, Ser 1.25; Potassium 4.2; Sodium 141  Recent Lipid Panel No results found for: CHOL, TRIG, HDL, CHOLHDL, VLDL, LDLCALC, LDLDIRECT  Physical Exam:    VS:  BP (!) 162/72   Pulse 68   Ht 5\' 5"  (1.651 m)   Wt 177 lb (80.3 kg)   BMI 29.45 kg/m     Wt Readings from Last 3 Encounters:  05/22/20 177 lb (80.3 kg)  05/01/20 180 lb (81.6 kg)  03/28/20 180 lb (81.6 kg)     GEN:  Well nourished, well developed in no acute distress HEENT: Normal NECK: No JVD; No carotid bruits LYMPHATICS: No lymphadenopathy CARDIAC: RRR, no murmurs, rubs, gallops RESPIRATORY:  Clear to auscultation without rales, wheezing or rhonchi  ABDOMEN: Soft, non-tender, non-distended MUSCULOSKELETAL:  No edema; No deformity  SKIN: Warm and dry NEUROLOGIC:  Alert and oriented x 3 PSYCHIATRIC:  Normal affect    ASSESSMENT:    1. Paroxysmal atrial flutter (HCC)   2. History of alcohol use   3. Resistant hypertension    PLAN:    In order of problems listed above:  HTN Started new combination pill 5 days ago. At home 140s/60s with heart rate in the 60s. BMET today. He was generally confused with medication changes and stopped Toprol, although prior notes do not indicate this. BP elevated today. Heart rate in the 60s off Toprol. I will start coreg 3.125mg  BID. Also will check renal artery duplex. Follow-up in 2 weeks to reassess BP and imaging.  PAF In SR today. Due to confusing instruction he stopped taking Eliquis. CHADSVAC of 2 (HTN, age). He was instructed to restart Eliquis. Denies further alcohol use.   History of alcohol use Reports cessation  Disposition: Follow up in 2 week(s) with APP   Shared Decision Making/Informed Consent  Signed, Sevin Langenbach David Stall, PA-C  05/22/2020 11:51 AM    [facility]

## 2020-05-22 NOTE — Patient Instructions (Signed)
Medication Instructions:  Your physician has recommended you make the following change in your medication:   1) RESTART Eliquis 5mg  TWICE daily  2) STOP Metoprolol  3) START Carvedilol 3.125mg  TWICE daily  *If you need a refill on your cardiac medications before your next appointment, please call your pharmacy*   Lab Work: Your physician recommends that you have lab work TODAY: Bmet  If you have labs (blood work) drawn today and your tests are completely normal, you will receive your results only by: MyChart Message (if you have MyChart) OR . A paper copy in the mail If you have any lab test that is abnormal or we need to change your treatment, we will call you to review the results.   Testing/Procedures:  Your physician has requested that you have a renal artery duplex. During this test, an ultrasound is used to evaluate blood flow to the kidneys. Allow one hour for this exam. Do not eat after midnight the day before and avoid carbonated beverages. Take your medications as you usually do.   Follow-Up: At [facility], you and your health needs are our priority.  As part of our continuing mission to provide you with exceptional heart care, we have created designated Provider Care Teams.  These Care Teams include your primary Cardiologist (physician) and Advanced Practice Providers (APPs -  Physician Assistants and Nurse Practitioners) who all work together to provide you with the care you need, when you need it.  We recommend signing up for the patient portal called "MyChart".  Sign up information is provided on this After Visit Summary.  MyChart is used to connect with patients for Virtual Visits (Telemedicine).  Patients are able to view lab/test results, encounter notes, upcoming appointments, etc.  Non-urgent messages can be sent to your provider as well.   To learn more about what you can do with MyChart, go to [facility].    Your next appointment:   2  week(s)  The format for your next appointment:   In Person  Provider:   You may see [email or web address], MD or one of the following Advanced Practice Providers on your designated Care Team:    Yvonne Kendall, NP  Nicolasa Ducking, PA-C  Eula Listen, PA-C  Cadence Shoshone, [place]  [place], NP

## 2020-05-23 LAB — BASIC METABOLIC PANEL
BUN/Creatinine Ratio: 19 (ref 10–24)
BUN: 27 mg/dL (ref 8–27)
CO2: 23 mmol/L (ref 20–29)
Calcium: 10.1 mg/dL (ref 8.6–10.2)
Chloride: 99 mmol/L (ref 96–106)
Creatinine, Ser: 1.45 mg/dL — ABNORMAL HIGH (ref 0.76–1.27)
Glucose: 108 mg/dL — ABNORMAL HIGH (ref 65–99)
Potassium: 4.2 mmol/L (ref 3.5–5.2)
Sodium: 141 mmol/L (ref 134–144)
eGFR: 53 mL/min/{1.73_m2} — ABNORMAL LOW (ref >59)

## 2020-05-24 ENCOUNTER — Telehealth: Payer: Self-pay | Admitting: *Deleted

## 2020-05-24 DIAGNOSIS — Z79899 Other long term (current) drug therapy: Secondary | ICD-10-CM

## 2020-05-24 DIAGNOSIS — I10 Essential (primary) hypertension: Secondary | ICD-10-CM

## 2020-05-24 NOTE — Telephone Encounter (Signed)
-----   Message from Cadence David Stall, PA-C sent at 05/24/2020  9:54 AM EDT ----- Please call patient and let him know kidney function was a little worse, but it has been elevated in the past so we will re-check BMET 4/1 (when he comes in for his kidney US) to see if it will stabilize. Please order!Thanks!

## 2020-05-24 NOTE — Telephone Encounter (Signed)
Spoke to pt. Notified of lab results and provider's recc.  Pt verbalized understanding. He will [facility] prior to his next appt for renal US on 4/1. Lab orders placed. No further questions at this time.

## 2020-05-24 NOTE — Telephone Encounter (Signed)
Rescheduled with patient.

## 2020-05-24 NOTE — Telephone Encounter (Signed)
Pt also asks if he can r/s his next follow up with [facility] that is on 4/7. May need to change time d/t transportation issues.  Forwarding to scheduling. Pt aware he will receive call back to r/s.

## 2020-06-06 ENCOUNTER — Telehealth: Payer: Self-pay | Admitting: Medical

## 2020-06-06 DIAGNOSIS — Z79899 Other long term (current) drug therapy: Secondary | ICD-10-CM

## 2020-06-06 DIAGNOSIS — I10 Essential (primary) hypertension: Secondary | ICD-10-CM

## 2020-06-06 NOTE — Telephone Encounter (Signed)
Patient is questioning if he is supposed to have labwork tomorrow when he comes for his renal ultrasound. Please call and advise.

## 2020-06-06 NOTE — Telephone Encounter (Signed)
Spoke to pt. See phone note 3/18. Notified pt to go to the medical mall tomorrow approx 30 min prior to his [place] appt to have lab work done.  Reminded pt this is not fasting and explained where he needs to go. Pt will have repeat Bmet. Orders are placed.  Pt verbalized understanding.

## 2020-06-07 ENCOUNTER — Ambulatory Visit (INDEPENDENT_AMBULATORY_CARE_PROVIDER_SITE_OTHER): Payer: Medicare Other

## 2020-06-07 ENCOUNTER — Other Ambulatory Visit: Payer: Self-pay

## 2020-06-07 ENCOUNTER — Other Ambulatory Visit
Admission: RE | Admit: 2020-06-07 | Discharge: 2020-06-07 | Disposition: A | Discharge status: Home / Self Care | Payer: Medicare Other | Attending: Medical | Admitting: Medical

## 2020-06-07 DIAGNOSIS — I10 Essential (primary) hypertension: Secondary | ICD-10-CM | POA: Diagnosis not present

## 2020-06-07 DIAGNOSIS — Z79899 Other long term (current) drug therapy: Secondary | ICD-10-CM | POA: Insufficient documentation

## 2020-06-07 LAB — BASIC METABOLIC PANEL
Anion gap: 9 (ref 5–15)
BUN: 33 mg/dL — ABNORMAL HIGH (ref 8–23)
CO2: 26 mmol/L (ref 22–32)
Calcium: 9.5 mg/dL (ref 8.9–10.3)
Chloride: 103 mmol/L (ref 98–111)
Creatinine, Ser: 1.64 mg/dL — ABNORMAL HIGH (ref 0.61–1.24)
GFR, Estimated: 46 mL/min — ABNORMAL LOW (ref >60)
Glucose, Bld: 109 mg/dL — ABNORMAL HIGH (ref 70–99)
Potassium: 4.4 mmol/L (ref 3.5–5.1)
Sodium: 138 mmol/L (ref 135–145)

## 2020-06-12 ENCOUNTER — Telehealth: Payer: Self-pay | Admitting: *Deleted

## 2020-06-12 NOTE — Telephone Encounter (Signed)
Attempted to call pt to review lab results and provider's recc.  No answer. Unable to leave voicemail.

## 2020-06-12 NOTE — Telephone Encounter (Signed)
-----   Message from Cadence David Stall, PA-C sent at 06/12/2020  1:45 PM EDT ----- Please call patient and inform him labs showed worsening kdiney function. Have him hold his combination pill. He has an appointment tomorrow.

## 2020-06-13 ENCOUNTER — Encounter: Payer: Self-pay | Admitting: Physician Assistant

## 2020-06-13 ENCOUNTER — Telehealth: Payer: Self-pay | Admitting: *Deleted

## 2020-06-13 ENCOUNTER — Other Ambulatory Visit: Payer: Self-pay

## 2020-06-13 ENCOUNTER — Ambulatory Visit (INDEPENDENT_AMBULATORY_CARE_PROVIDER_SITE_OTHER): Payer: Medicare Other | Admitting: Physician Assistant

## 2020-06-13 ENCOUNTER — Ambulatory Visit: Payer: Medicare Other | Admitting: Physician Assistant

## 2020-06-13 VITALS — BP 148/68 | HR 56 | Ht 65.0 in | Wt 177.0 lb

## 2020-06-13 DIAGNOSIS — I10 Essential (primary) hypertension: Secondary | ICD-10-CM | POA: Diagnosis not present

## 2020-06-13 DIAGNOSIS — F1010 Alcohol abuse, uncomplicated: Secondary | ICD-10-CM

## 2020-06-13 DIAGNOSIS — Z720 Tobacco use: Secondary | ICD-10-CM

## 2020-06-13 DIAGNOSIS — Z87898 Personal history of other specified conditions: Secondary | ICD-10-CM | POA: Diagnosis not present

## 2020-06-13 DIAGNOSIS — N1831 Chronic kidney disease, stage 3a: Secondary | ICD-10-CM

## 2020-06-13 DIAGNOSIS — R7309 Other abnormal glucose: Secondary | ICD-10-CM

## 2020-06-13 DIAGNOSIS — D638 Anemia in other chronic diseases classified elsewhere: Secondary | ICD-10-CM

## 2020-06-13 DIAGNOSIS — I4892 Unspecified atrial flutter: Secondary | ICD-10-CM | POA: Diagnosis not present

## 2020-06-13 DIAGNOSIS — Z79899 Other long term (current) drug therapy: Secondary | ICD-10-CM | POA: Diagnosis not present

## 2020-06-13 DIAGNOSIS — I5020 Unspecified systolic (congestive) heart failure: Secondary | ICD-10-CM

## 2020-06-13 NOTE — Patient Instructions (Signed)
Medication Instructions:  Your physician recommends that you continue on your current medications as directed. Please refer to the Current Medication list given to you today.  *If you need a refill on your cardiac medications before your next appointment, please call your pharmacy*   Lab Work: Your physician recommends that you have lab work TODAY: BMET, A1c  If you have labs (blood work) drawn today and your tests are completely normal, you will receive your results only by: Marland Kitchen MyChart Message (if you have MyChart) OR . A paper copy in the mail If you have any lab test that is abnormal or we need to change your treatment, we will call you to review the results.   Testing/Procedures: None ordered   Follow-Up: At [facility], you and your health needs are our priority.  As part of our continuing mission to provide you with exceptional heart care, we have created designated Provider Care Teams.  These Care Teams include your primary Cardiologist (physician) and Advanced Practice Providers (APPs -  Physician Assistants and Nurse Practitioners) who all work together to provide you with the care you need, when you need it.  We recommend signing up for the patient portal called "MyChart".  Sign up information is provided on this After Visit Summary.  MyChart is used to connect with patients for Virtual Visits (Telemedicine).  Patients are able to view lab/test results, encounter notes, upcoming appointments, etc.  Non-urgent messages can be sent to your provider as well.   To learn more about what you can do with MyChart, go to [email or web address].    Your next appointment:    We will call you regarding follow up after your lab results return  The format for your next appointment:   In Person  Provider:   You may see Yvonne Kendall, MD or one of the following Advanced Practice Providers on your designated Care Team:     Marisue Ivan, [place]

## 2020-06-13 NOTE — Telephone Encounter (Signed)
Received notification via fax from [facility] Patient assistance foundation that pt's assistance to receive Eliquis has been extended through 03/08/21.  No action required from pt per fax. Placed notification in designated file cabinet.

## 2020-06-13 NOTE — Progress Notes (Signed)
Office Visit    Patient Name: Jacob Waters Date of Encounter: 06/13/2020  Primary Care Provider:  Patient, No Pcp Per (Inactive) Primary Cardiologist:  Yvonne Kendall, MD  Chief Complaint    Chief Complaint  Patient presents with  . Follow-up    2 Week follow up and medications verbally reviewed with patient.     65 year old male with history of hypertension, h/o polysubstance use including alcohol use, paroxysmal atrial flutter (06/2018), and seen today for 1 month follow-up of BP.  Past Medical History    Past Medical History:  Diagnosis Date  . Alcohol abuse   . Atrial flutter (HCC)   . Hypertension    Past Surgical History:  Procedure Laterality Date  . NO PAST SURGERIES      Allergies  No Known Allergies  History of Present Illness    Jacob Waters is a 65 y.o. male with PMH as above.     He was admitted 06/2018 with left arm and flank numbness and paresthesias.  The symptoms persisted; therefore, he presented to [facility] ED.  He was found to be in atrial flutter with variable block and frequently elevated ventricular rates.  He was noted to have elevated blood alcohol level at that time.  He was placed on IV diltiazem and oral metoprolol for rate control.  He subsequently converted to NSR on metoprolol tartrate 100 mg twice daily and ASA 81 mg daily, as well as amlodipine 10 mg daily for blood pressure control.  He was seen after discharge via telemedicine 06/29/2018 and overall doing well from a cardiac standpoint.  He reported getting more tired than usual when compared with a few years ago.  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  He was not monitoring his blood pressure at home but hoped to use his sisters BP cuff in the near future.  No medication changes.  When seen in follow-up 08/19/2018, he was only taking his metoprolol tartrate once daily.  He was asymptomatic and bradycardia noted on that day's visit.  He was transitioned to metoprolol  succinate 100 mg daily.  Alcohol cessation was advised.  He was continued on Toprol and amlodipine.  On 11/12/2019, he presented to [facility] and EKG showed atrial flutter with ventricular rates into the 150s.  He had run out of his medications. He was hypertensive.  He was restarted on blood pressure medications, including Toprol-XL, later reduced for bradycardia.  He was started on CIWA protocol for alcohol use at 24 pk of beers daily x2 weeks.  He was started on anticoagulation with Eliquis 5 mg twice daily due to CHA2DS2VASc score of at least 2.  It was noted that he had a history of medication noncompliance; therefore, he may benefit from once daily Xarelto to be reassessed in clinic.  Toprol-XL was decreased to 50 mg daily.  Seen September and October 2021.  Initially, he was not on Eliquis due to cost.  Samples and coupon were provided for him.  Medication management was also contacted for assistance with hydralazine, metoprolol, and amlodipine.  Hydralazine was increased to 100 mg 3 times daily for continued elevated pressure at visits with patient report that he did not feel well on this dose.  He was able to find a home BP cuff with SBP 140s to 180s.  He quit EtOH.  He was still using tobacco/chewing tobacco.  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   When seen  by his primary cardiologist 03/28/2020, BP was still elevated with recommendation for addition of HCTZ daily to regimen of amlodipine, hydralazine, and metoprolol. It was noted that preference was to get him off of hydralazine to simplify his regimen.  Transitioning to an ACE/ARB would be his next choice.  If BP remained difficult to control, secondary hypertension work-up should be pursued.  It was noted he would need a BMET again in 1 month for follow-up.  Seen 05/01/2020 and 05/22/2020 with report of difficulty obtaining medications and elevated BP in clinic.  He was checking his blood pressure at home with SBP 140s to 150s and  DBP 60s to 70s.  He was still smoking with cessation recommended.  Given uncontrolled blood pressure, he was weaned off of hydralazine 2/23 and onto combination olmesartan/amlodipine/HCTZ.  He was seen at follow-up 3/16 if BP still elevated and started on carvedilol 3.125 mg twice daily.  He did not fill his Coreg, however, as he was unable to afford this medication.    Today, he reports difficulty obtaining carvedilol due to financial limitations.  He also reports difficulty affording Eliquis since that time, though he has not missed any doses.  He currently has 1 bottle of Eliquis left.  He reports that he does not qualify for medication assistance.  On discussion with Lanny Hurst, RN, it is discovered that his medication assistance has specifically been extended for Eliquis, given the cost of this medication.  Medication assistance in progress.  Today, 06/13/2020, he returns to clinic with concern regarding his BP.  He denies any chest pain, shortness of breath, dyspnea, racing heart rate, palpitations, presyncope, syncope, or signs or symptoms of volume overload.  He denies any signs or symptoms of bleeding.  No regular exercise routine.  He reports medication compliance, though unable to afford Coreg.  He denies any alcohol use.  He is still using tobacco.  He is but asymptomatic.  He reports some sinus congestion with discussion of Coricidin.  No signs or symptoms of bleeding.  Medication assistance discussed.  Home Medications    Prior to Admission medications   Medication Sig Start Date End Date Taking? Authorizing Provider  amLODipine (NORVASC) 10 MG tablet Take 1 tablet (10 mg total) by mouth daily. 11/14/19 12/14/19  Shahmehdi, Gemma Payor, MD  apixaban (ELIQUIS) 5 MG TABS tablet Take 1 tablet (5 mg total) by mouth 2 (two) times daily. 11/14/19 12/14/19  ShahmehdiGemma Payor, MD  aspirin EC 81 MG tablet Take 1 tablet (81 mg total) by mouth daily. Patient not taking: Reported on 11/12/2019 06/22/18   Milagros Loll, MD  hydrALAZINE (APRESOLINE) 50 MG tablet Take 1 tablet (50 mg total) by mouth every 8 (eight) hours. 11/14/19 12/14/19  Kendell Bane, MD  metoprolol succinate (TOPROL-XL) 50 MG 24 hr tablet Take 1 tablet (50 mg total) by mouth daily with breakfast. Take with or immediately following a meal. 11/15/19 12/15/19  Shahmehdi, Gemma Payor, MD  Multiple Vitamin (MULTIVITAMIN WITH MINERALS) TABS tablet Take 1 tablet by mouth daily. 11/15/19 12/15/19  Kendell Bane, MD    Review of Systems    He denies chest pain, palpitations, dyspnea, pnd, orthopnea, n, v, dizziness, syncope, edema, weight gain, or early satiety. All other systems reviewed and are otherwise negative except as noted above.  Physical Exam    VS:  BP (!) 148/68 (BP Location: Left Arm, Patient Position: Sitting, Cuff Size: Normal)   Pulse (!) 56   Ht 5\' 5"  (1.651 m)  Wt 177 lb (80.3 kg)   SpO2 98%   BMI 29.45 kg/m  , BMI Body mass index is 29.45 kg/m. GEN: Well nourished, well developed, in no acute distress. HEENT: normal. Neck: Supple, no JVD, carotid bruits, or masses. Cardiac: bradycardic with extrasystole, 1/6 systolc murmur. No rubs, or gallops. No clubbing, cyanosis, edema.  Radials/DP/PT 2+ and equal bilaterally.  Respiratory:  Respirations regular and unlabored, clear to auscultation bilaterally. GI: Slightly distended/firm, BS + x 4. MS:  no deformity or atrophy. Skin: warm and dry, no rash. Neuro:  Strength and sensation are intact. Psych: Normal affect.  Accessory Clinical Findings    ECG personally reviewed by me today -no ekg- no acute changes.  VITALS Reviewed today   Temp Readings from Last 3 Encounters:  11/14/19 98.7 F (37.1 C)  06/22/18 98.9 F (37.2 C) (Axillary)   BP Readings from Last 3 Encounters:  06/13/20 (!) 148/68  05/22/20 (!) 162/72  05/01/20 (!) 152/74   Pulse Readings from Last 3 Encounters:  06/13/20 (!) 56  05/22/20 68  05/01/20 69    Wt Readings from Last 3  Encounters:  06/13/20 177 lb (80.3 kg)  05/22/20 177 lb (80.3 kg)  05/01/20 180 lb (81.6 kg)     LABS  reviewed today   Lab Results  Component Value Date   WBC 4.1 12/06/2019   HGB 12.1 (L) 12/06/2019   HCT 36.0 (L) 12/06/2019   MCV 89 12/06/2019   PLT 433 12/06/2019   Lab Results  Component Value Date   CREATININE 1.64 (H) 06/07/2020   BUN 33 (H) 06/07/2020   NA 138 06/07/2020   K 4.4 06/07/2020   CL 103 06/07/2020   CO2 26 06/07/2020   Lab Results  Component Value Date   ALT 33 11/11/2019   AST 64 (H) 11/11/2019   ALKPHOS 74 11/11/2019   BILITOT 1.2 11/11/2019   No results found for: CHOL, HDL, LDLCALC, LDLDIRECT, TRIG, CHOLHDL  Lab Results  Component Value Date   HGBA1C 6.0 (H) 06/21/2018   Lab Results  Component Value Date   TSH 0.751 06/21/2018     STUDIES/PROCEDURES reviewed today   TTE (06/21/2018): Normal LV size.  LVEF 50-55%.  Normal RV size and function.  No significant valvular abnormality seen, though evaluation was limited by cardiac and suboptimal acoustic windows.  Echo 06/2018 1. The left ventricle has low normal systolic function, with an ejection  fraction of 50-55%. The cavity size was normal. Left ventricular diastolic  function could not be evaluated due to nondiagnostic images.  2. Evaluation of LVEF is limited due to tachycardia and poor apical  windows. Consider limited echo once atrial flutter/tachycardia has  resolved.  3. The right ventricle has normal systolic function. The cavity was  normal. There is no increase in right ventricular wall thickness.  4. Left atrial size was not well visualized.  5. The mitral valve was not well visualized. Mild thickening of the  mitral valve leaflet.  6. The aortic valve is tricuspid. Mild thickening of the aortic valve.  Mild calcification of the aortic valve. Aortic valve regurgitation was not  assessed by color flow Doppler.  7. The interatrial septum was not well visualized.   US  RA bilateral Summary:  Largest Aortic Diameter: 2.1 cm  Renal:  Right: Normal size right kidney. Normal right Resisitive Index.     Normal cortical thickness of right kidney. No evidence of     right renal artery stenosis. RRV flow present.  Left: LRV flow present. No evidence of left renal artery stenosis.     Normal size of left kidney. Normal left Resistive Index.     Normal cortical thickness of the left kidney.  Mesenteric:  Normal Celiac artery and Superior Mesenteric artery findings.   Assessment & Plan    Essential hypertension, poorly controlled, goal BP 130/80 or lower --Clinic BP still sub-optimal. Home BP cuff obtained.  Since I started seeing pt, I have weaned off of hydralazine 50 TID. To support medication compliance, I started on combination olmesartan-amlodipine-HCTZ. He has maxed out this dose with BP still elevated.  We will check a BMET today with plan to stop olmesartan and start Entresto if Cr//K allow. We could also start spironolactone if BMET allows +/- addition of Imdur.  Also discussed was stopping HCTZ and changing to lasix or torsemide. Will defer for now and obtain labs/BMET.  If room in HR at RTC, consider restart of BB (Toprol fell off list, cannot afford Coreg). Today, BB precluded by bradycardia - will defer. Renal artery [place] without stenosis.Continue to monitor BP at home. Salt and fluid restrictions reviewed.  AOC HFmrEF --Denies s/sx of volume overload though abdominal distention noted on exam. Previous EF 50-55% in atrial tachycardia. Reassess limited echo at RTC if can confirm in NSR with a repeat EKG (no EKG obtained today). As above, consider stopping olmesartan-amlodipine-HCTZ combination pill. If Cr/K allow with repeat BMET, discontinue olmesartan and start Entresto, continue amlodipine, and discontinue HCTZ for torsemide or lasix. Also considered was Imdur and spironolactone. If HR allows, add back BB, precluded by bradycardia today.  Consider Farxiga at RTC. Order limited echo at RTC. If EF still reduced on limited echo, recommend further ischemic workup at that time.  Paroxysmal atrial flutter with RVR --No EKG today  - recommend EKG at RTC.  He reports that he has not had any racing heart rate or palpitations. He is no longer on Toprol, due to confusion when called about a result note. He was started on Coreg after that time but could not afford this medication. Will defer addition of BB given bradycardia today.  He continues Eliquis 5 mg twice daily with CHA2DS2VASc score of at least 2 (HTN, age). No s/sx of bleeding. Continue OAC. Medication assistance in process.  Recommend limited echo at RTC recommended once EKG obtained and able to confirm maintaining NSR, given previous EF reduced.  History of alcohol use, tobacco use --No further alcohol use. Tobacco cessation discussed.   Financial assistance --Medication management obtained for Eliquis. Was unable to afford Coreg. Unclear if can afford Entresto. Will investigate options for medication assistance.  Elevated A1C --Will recheck today. He will find a PCP. Consider Farxiga at RTC.  Anemia --Likely of chronic dz. Denies s/sx of bleeding. Continue to monitor.   Medication changes: Pending labs, if can afford, consider Entresto. Consider lasix/torsemide, increased dose Spironolactone, Imdur, Marcelline Deist. Labs ordered: BMET now and within 1-2 weeks of change.  A1C (he will find a PCP) Studies / Imaging ordered: None. Future considerations:  Farxiga. Limited echo at RTC. Consider Entresto instead of olmesartan/incr dose Spironolactone/Lasix or torsemide instead of HCTZ if Cr/K allow, BB if HR allows, Imdur, Farxiga. Disposition: 2 weeks   Lennon Alstrom, PA-C 05/01/2020

## 2020-06-14 LAB — BASIC METABOLIC PANEL
BUN/Creatinine Ratio: 19 (ref 10–24)
BUN: 26 mg/dL (ref 8–27)
CO2: 22 mmol/L (ref 20–29)
Calcium: 9.8 mg/dL (ref 8.6–10.2)
Chloride: 101 mmol/L (ref 96–106)
Creatinine, Ser: 1.34 mg/dL — ABNORMAL HIGH (ref 0.76–1.27)
Glucose: 93 mg/dL (ref 65–99)
Potassium: 4.7 mmol/L (ref 3.5–5.2)
Sodium: 138 mmol/L (ref 134–144)
eGFR: 59 mL/min/{1.73_m2} — ABNORMAL LOW (ref >59)

## 2020-06-14 LAB — HEMOGLOBIN A1C
Est. average glucose Bld gHb Est-mCnc: 131 mg/dL
Hgb A1c MFr Bld: 6.2 % — ABNORMAL HIGH (ref 4.8–5.6)

## 2020-06-14 NOTE — Telephone Encounter (Signed)
Pt seen in clinic yesterday 4/8. See office note.

## 2020-06-17 ENCOUNTER — Telehealth: Payer: Self-pay | Admitting: *Deleted

## 2020-06-17 ENCOUNTER — Telehealth: Payer: Self-pay | Admitting: Physician Assistant

## 2020-06-17 DIAGNOSIS — I4892 Unspecified atrial flutter: Secondary | ICD-10-CM

## 2020-06-17 DIAGNOSIS — I5023 Acute on chronic systolic (congestive) heart failure: Secondary | ICD-10-CM

## 2020-06-17 MED ORDER — ISOSORBIDE MONONITRATE ER 30 MG PO TB24: 30.0000 mg | ORAL_TABLET | Freq: Every day | ORAL | 3 refills | Status: DC

## 2020-06-17 NOTE — Telephone Encounter (Signed)
-----   Message from Lennon Alstrom, PA-C sent at 06/16/2020  1:13 PM EDT ----- Labs show --Kidney function improving from previous labs but still not back down to baseline function.  --Potassium on the higher side of normal.   I will hold off on changing his diuretic /combination pill until we know his insurance coverage. In the meantime, let's add Imdur 30mg  daily for further BP support.  He should let know how he tolerates this medication.   We can revisit breaking up his combination pill / changing him from HCTZ to torsemide at RTC or if he doesn't tolerate Imdur. I want to be certain he can afford any medication changes and will reassess medication coverage at RTC (currently working on financial assistance).  Hemoglobin A1C is 6.2. Recommend follow-up with PCP.   Let's have him follow-up in 3-4 weeks.

## 2020-06-17 NOTE — Telephone Encounter (Signed)
What diagnosis do you want me to use for pt's echo?  And this will be a complete echo correct?  Thanks!

## 2020-06-17 NOTE — Telephone Encounter (Signed)
Attempted to call pt regarding his questions.  No answer. Unable to leave vm.

## 2020-06-17 NOTE — Telephone Encounter (Signed)
You can use both the below dx for the echo: Acute on chronic systolic heart failure Paroxysmal atrial flutter with rapid ventricular rate  Yes, please order a complete echo.

## 2020-06-17 NOTE — Telephone Encounter (Signed)
Patient wants to discuss recent med changes and current meds.   Please call.

## 2020-06-17 NOTE — Telephone Encounter (Signed)
Please place order for echo

## 2020-06-17 NOTE — Telephone Encounter (Signed)
Orders placed.

## 2020-06-17 NOTE — Telephone Encounter (Signed)
Spoke to pt. Notified of lab results and provider's recc. Also note additional message received below:   Lennon Alstrom, PA-C  Annia Belt, RN Hello,   Now that Mr. Demasi is maintaining sinus rhythm, can we please update an echo as indicated in Dr. Serita Kyle previous notes?   We can follow-up in 3-4 weeks or after the echo (he needs this scheduled). I also added Imdur to his medications, as outlined in my result note.   Thank you!  Jacquelyn    Pt also notified that we will schedule and Echo and f/u in 3-4 weeks. Pt aware he will receive a call from our office to have these scheduled.  Pt voiced understanding that he will start Imdur 30mg  daily, Rx sent to pharmacy, and pt will call to let know how he tolerated.  Pt also aware to follow up with PCP re A1C.   Forwarding message to scheduling for Echo and follow up.

## 2020-06-18 ENCOUNTER — Other Ambulatory Visit: Payer: Self-pay

## 2020-06-18 ENCOUNTER — Ambulatory Visit (INDEPENDENT_AMBULATORY_CARE_PROVIDER_SITE_OTHER): Payer: Medicare Other

## 2020-06-18 DIAGNOSIS — I4892 Unspecified atrial flutter: Secondary | ICD-10-CM | POA: Diagnosis not present

## 2020-06-18 DIAGNOSIS — I5023 Acute on chronic systolic (congestive) heart failure: Secondary | ICD-10-CM | POA: Diagnosis not present

## 2020-06-18 LAB — ECHOCARDIOGRAM COMPLETE
Area-P 1/2: 3.27 cm2
S' Lateral: 2.5 cm

## 2020-06-18 NOTE — Telephone Encounter (Signed)
Spoke to pt. Answered questions regarding new medication Imdur 30mg  daily.  Pt understands he will continue his combination Olmesartan-amlodipine-hctz 40-10-25mg  daily and Eliquis 5mg  BID in addition to starting Imdur 30mg  daily. Pt verbalized understanding. He had echo completed today and will follow up with , PA 07/15/20.  No further questions at this time.

## 2020-06-25 ENCOUNTER — Telehealth: Payer: Self-pay

## 2020-06-25 NOTE — Telephone Encounter (Signed)
Was able to contact pt regarding his recent results of echocardiogram, all questions and concerns were answer, no further testing or med changes at this time, pt grateful for call, nothing further at this time. Will f/u as schedule on 5/9 with Michaelle Birks, PA-C and monitor his BP and keep a log for review.

## 2020-07-15 ENCOUNTER — Other Ambulatory Visit: Payer: Self-pay

## 2020-07-15 ENCOUNTER — Telehealth: Payer: Self-pay | Admitting: *Deleted

## 2020-07-15 ENCOUNTER — Ambulatory Visit (INDEPENDENT_AMBULATORY_CARE_PROVIDER_SITE_OTHER): Payer: Medicare Other | Admitting: Physician Assistant

## 2020-07-15 ENCOUNTER — Encounter: Payer: Self-pay | Admitting: Physician Assistant

## 2020-07-15 VITALS — BP 142/64 | HR 86 | Ht 65.0 in | Wt 177.0 lb

## 2020-07-15 DIAGNOSIS — Z87898 Personal history of other specified conditions: Secondary | ICD-10-CM

## 2020-07-15 DIAGNOSIS — D638 Anemia in other chronic diseases classified elsewhere: Secondary | ICD-10-CM

## 2020-07-15 DIAGNOSIS — I10 Essential (primary) hypertension: Secondary | ICD-10-CM

## 2020-07-15 DIAGNOSIS — Z7901 Long term (current) use of anticoagulants: Secondary | ICD-10-CM

## 2020-07-15 DIAGNOSIS — Z720 Tobacco use: Secondary | ICD-10-CM

## 2020-07-15 DIAGNOSIS — Z79899 Other long term (current) drug therapy: Secondary | ICD-10-CM

## 2020-07-15 DIAGNOSIS — I4892 Unspecified atrial flutter: Secondary | ICD-10-CM | POA: Diagnosis not present

## 2020-07-15 DIAGNOSIS — I5023 Acute on chronic systolic (congestive) heart failure: Secondary | ICD-10-CM

## 2020-07-15 DIAGNOSIS — R7309 Other abnormal glucose: Secondary | ICD-10-CM

## 2020-07-15 DIAGNOSIS — I1A0 Resistant hypertension: Secondary | ICD-10-CM

## 2020-07-15 DIAGNOSIS — Z5989 Other problems related to housing and economic circumstances: Secondary | ICD-10-CM

## 2020-07-15 DIAGNOSIS — N1831 Chronic kidney disease, stage 3a: Secondary | ICD-10-CM

## 2020-07-15 DIAGNOSIS — Z5971 Insufficient health insurance coverage: Secondary | ICD-10-CM

## 2020-07-15 MED ORDER — AMLODIPINE BESYLATE 10 MG PO TABS: 10.0000 mg | ORAL_TABLET | Freq: Every day | ORAL | 3 refills | Status: DC

## 2020-07-15 MED ORDER — ENTRESTO 49-51 MG PO TABS: 1.0000 | ORAL_TABLET | Freq: Two times a day (BID) | ORAL | 3 refills | Status: AC

## 2020-07-15 MED ORDER — FUROSEMIDE 20 MG PO TABS: 20.0000 mg | ORAL_TABLET | Freq: Every day | ORAL | 3 refills | Status: DC

## 2020-07-15 NOTE — Telephone Encounter (Signed)
Patient assistance application for [facility] faxed to [employer] @ [phone].

## 2020-07-15 NOTE — Patient Instructions (Signed)
Medication Instructions:  Your physician has recommended you make the following change in your medication:   STOP Olmesartan/Amlodipine/HCTZ combination pill   START Amlodipine 10mg  daily  START Furosemide 20mg  daily  START Entresto 49/51mg  TWICE daily - Use 30 day coupon given in office when you pick this up                                                                 - We have also started paperwork for patient assistance for Entresto                                                                   This may take several days to get response for approval or denial  *If you need a refill on your cardiac medications before your next appointment, please call your pharmacy*   Lab Work:  Your physician recommends that you return for lab work at the [facility] at [facility] in: 1-2 weeks (BMET)  -  Please go to the [facility]. You will check in at the front desk to the right as you walk into the atrium. Valet Parking is offered if needed. - No appointment needed. You may go any day between 7 am and 6 pm.    Testing/Procedures: None ordered   Follow-Up: At [facility], you and your health needs are our priority.  As part of our continuing mission to provide you with exceptional heart care, we have created designated Provider Care Teams.  These Care Teams include your primary Cardiologist (physician) and Advanced Practice Providers (APPs -  Physician Assistants and Nurse Practitioners) who all work together to provide you with the care you need, when you need it.  We recommend signing up for the patient portal called "MyChart".  Sign up information is provided on this After Visit Summary.  MyChart is used to connect with patients for Virtual Visits (Telemedicine).  Patients are able to view lab/test results, encounter notes, upcoming appointments, etc.  Non-urgent messages can be sent to your provider as well.   To learn more about what you can do with MyChart, go to  [facility].    Your next appointment:   1 month(s)  The format for your next appointment:   In Person  Provider:   You may see [facility], MD or one of the following Advanced Practice Providers on your designated Care Team:     [email or web address], Yvonne Kendall

## 2020-07-15 NOTE — Progress Notes (Signed)
Office Visit    Patient Name: Jacob Waters Date of Encounter: 07/15/2020  Primary Care Provider:  Patient, No Pcp Per (Inactive) Primary Cardiologist:  Yvonne Kendall, MD  Chief Complaint    Chief Complaint  Patient presents with  . Follow-up    Follow up and review echo results. Medications verbally reviewed with patient.     65 year old male with history of hypertension, h/o polysubstance use including alcohol use, paroxysmal atrial flutter (06/2018), and seen today for 1 month follow-up of BP.  Past Medical History    Past Medical History:  Diagnosis Date  . Alcohol abuse   . Atrial flutter (HCC)   . Hypertension    Past Surgical History:  Procedure Laterality Date  . NO PAST SURGERIES      Allergies  No Known Allergies  History of Present Illness    Jacob Waters is a 65 y.o. male with PMH as above.     He was admitted 06/2018 with left arm and flank numbness and paresthesias.  The symptoms persisted; therefore, he presented to [facility] ED.  He was found to be in atrial flutter with variable block and frequently elevated ventricular rates.  He was noted to have elevated blood alcohol level at that time.  He was placed on IV diltiazem and oral metoprolol for rate control.  He subsequently converted to NSR on metoprolol tartrate 100 mg twice daily and ASA 81 mg daily, as well as amlodipine 10 mg daily for blood pressure control.  He was seen after discharge via telemedicine 06/29/2018 and overall doing well from a cardiac standpoint.  He reported getting more tired than usual when compared with a few years ago.  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  He was not monitoring his blood pressure at home but hoped to use his sisters BP cuff in the near future.  No medication changes.  When seen in follow-up 08/19/2018, he was only taking his metoprolol tartrate once daily.  He was asymptomatic and bradycardia noted on that day's visit.  He was transitioned  to metoprolol succinate 100 mg daily.  Alcohol cessation was advised.  He was continued on Toprol and amlodipine.  On 11/12/2019, he presented to [facility] and EKG showed atrial flutter with ventricular rates into the 150s.  He had run out of his medications. He was hypertensive.  He was restarted on blood pressure medications, including Toprol-XL, later reduced for bradycardia.  He was started on CIWA protocol for alcohol use at 24 pk of beers daily x2 weeks.  He was started on anticoagulation with Eliquis 5 mg twice daily due to CHA2DS2VASc score of at least 2.  It was noted that he had a history of medication noncompliance; therefore, he may benefit from once daily Xarelto to be reassessed in clinic.  Toprol-XL was decreased to 50 mg daily.  Seen September and October 2021.  Initially, he was not on Eliquis due to cost.  Samples and coupon were provided for him.  Medication management was also contacted for assistance with hydralazine, metoprolol, and amlodipine.  Hydralazine was increased to 100 mg 3 times daily for continued elevated pressure at visits with patient report that he did not feel well on this dose.  He was able to find a home BP cuff with SBP 140s to 180s.  He quit EtOH.  He was still using tobacco/chewing tobacco.  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   When  seen by his primary cardiologist 03/28/2020, BP was still elevated with recommendation for addition of HCTZ daily to regimen of amlodipine, hydralazine, and metoprolol. It was noted that preference was to get him off of hydralazine to simplify his regimen.  Transitioning to an ACE/ARB would be his next choice.  If BP remained difficult to control, secondary hypertension work-up should be pursued.  It was noted he would need a BMET again in 1 month for follow-up.  Seen 05/01/2020 and 05/22/2020 with report of difficulty obtaining medications and elevated BP in clinic.  He was checking his blood pressure at home with SBP  140s to 150s and DBP 60s to 70s.  He was still smoking with cessation recommended.  Given uncontrolled blood pressure, he was weaned off of hydralazine 2/23 and onto combination olmesartan/amlodipine/HCTZ.  He was seen at follow-up 3/16 if BP still elevated and started on carvedilol 3.125 mg twice daily.  He did not fill his Coreg, however, as he was unable to afford this medication.    Today, he reports difficulty obtaining carvedilol due to financial limitations.  He also reports difficulty affording Eliquis since that time, though he has not missed any doses.  He currently has 1 bottle of Eliquis left.  He reports that he does not qualify for medication assistance.  On discussion with Lanny Hurst, RN, it is discovered that his medication assistance has specifically been extended for Eliquis, given the cost of this medication.  Medication assistance in progress.  Seen 06/13/2020, and doing well without any chest pain or shortness of breath.  He denied a regular exercise routine.  He was unable to afford Coreg.  He was not drinking alcohol but did report ongoing tobacco use.  He reported sinus congestion with discussion of Coricidin.  BP was still uncontrolled with plans to check a BMET and possibly stop his combination medication (olmesartan, amlodipine, and HCTZ) to start Entresto versus spironolactone.  He was started on Imdur.  Also discussed was stopping his HCTZ (part of his combination medication) to transition to Lasix or torsemide, especially given his suboptimal renal function.  As he was unable to afford Coreg, Toprol also discussed.  Given his bradycardia at his visit, this addition of beta-blocker was deferred.  Salt and fluid restrictions reviewed.  Today, 07/15/2020, he returns to clinic and notes that he is doing about the same from a cardiac standpoint.  No chest pain, shortness of breath, dyspnea, presyncope, or syncope.  No PND, orthopnea, or symptoms reported of volume overload.  He is trying  to increase activity as previously recommended and denies any chest pain or shortness of breath with walking.  He is currently walking every morning for several [place] and tolerating this well.  BP 142/64 today and still suboptimal, despite addition of Imdur.  He reports avoiding salt; however, on further review, he does report eating out frequently.  This morning, he had steak and eggs from [employer].  We reviewed the Dash diet with patient understanding.  He intends to eat more fish moving forward.  He does report that he is tolerating Imdur well.  He denies any associated headache with start of Imdur.  Options reviewed regarding BP support and medication changes moving forward.  Medication assistance form completed with the help of RN today, given suspect he will need assistance with Entresto.  He does report that he now has assistance with obtaining his Eliquis and has not missed any doses.  No signs or symptoms of bleeding.  He is monitoring  his BP at home with average pressures around 142/70.  He reports his lowest SBP is 137.  His HR is normally in the 60s.  He is typically asymptomatic with elevated pressures.  Home Medications    Prior to Admission medications   Medication Sig Start Date End Date Taking? Authorizing Provider  amLODipine (NORVASC) 10 MG tablet Take 1 tablet (10 mg total) by mouth daily. 11/14/19 12/14/19  Shahmehdi, Gemma Payor, MD  apixaban (ELIQUIS) 5 MG TABS tablet Take 1 tablet (5 mg total) by mouth 2 (two) times daily. 11/14/19 12/14/19  ShahmehdiGemma Payor, MD  aspirin EC 81 MG tablet Take 1 tablet (81 mg total) by mouth daily. Patient not taking: Reported on 11/12/2019 06/22/18   Milagros Loll, MD  hydrALAZINE (APRESOLINE) 50 MG tablet Take 1 tablet (50 mg total) by mouth every 8 (eight) hours. 11/14/19 12/14/19  Kendell Bane, MD  metoprolol succinate (TOPROL-XL) 50 MG 24 hr tablet Take 1 tablet (50 mg total) by mouth daily with breakfast. Take with or immediately following a meal.  11/15/19 12/15/19  Shahmehdi, Gemma Payor, MD  Multiple Vitamin (MULTIVITAMIN WITH MINERALS) TABS tablet Take 1 tablet by mouth daily. 11/15/19 12/15/19  Kendell Bane, MD    Review of Systems    He denies chest pain, palpitations, dyspnea, pnd, orthopnea, n, v, dizziness, syncope, edema, weight gain, or early satiety. All other systems reviewed and are otherwise negative except as noted above.  Physical Exam    VS:  BP (!) 142/64 (BP Location: Left Arm, Patient Position: Sitting, Cuff Size: Normal)   Pulse 86   Ht 5\' 5"  (1.651 m)   Wt 177 lb (80.3 kg)   SpO2 97%   BMI 29.45 kg/m  , BMI Body mass index is 29.45 kg/m. GEN: Well nourished, well developed, in no acute distress. HEENT: normal. Neck: Supple, no JVD, carotid bruits, or masses. Cardiac: RRR, 1/6 systolc murmur. No rubs, or gallops. No clubbing, cyanosis, edema.  Radials/DP/PT 2+ and equal bilaterally.  Respiratory:  Respirations regular and unlabored, clear to auscultation bilaterally. GI: Slightly distended/firm, BS + x 4. MS:  no deformity or atrophy. Skin: warm and dry, no rash. Neuro:  Strength and sensation are intact. Psych: Normal affect.  Accessory Clinical Findings    ECG personally reviewed by me today -no ekg- no acute changes.  VITALS Reviewed today   Temp Readings from Last 3 Encounters:  11/14/19 98.7 F (37.1 C)  06/22/18 98.9 F (37.2 C) (Axillary)   BP Readings from Last 3 Encounters:  07/15/20 (!) 142/64  06/13/20 (!) 148/68  05/22/20 (!) 162/72   Pulse Readings from Last 3 Encounters:  07/15/20 86  06/13/20 (!) 56  05/22/20 68    Wt Readings from Last 3 Encounters:  07/15/20 177 lb (80.3 kg)  06/13/20 177 lb (80.3 kg)  05/22/20 177 lb (80.3 kg)     LABS  reviewed today   Lab Results  Component Value Date   WBC 4.1 12/06/2019   HGB 12.1 (L) 12/06/2019   HCT 36.0 (L) 12/06/2019   MCV 89 12/06/2019   PLT 433 12/06/2019   Lab Results  Component Value Date   CREATININE 1.34 (H)  06/13/2020   BUN 26 06/13/2020   NA 138 06/13/2020   K 4.7 06/13/2020   CL 101 06/13/2020   CO2 22 06/13/2020   Lab Results  Component Value Date   ALT 33 11/11/2019   AST 64 (H) 11/11/2019   ALKPHOS 74 11/11/2019  BILITOT 1.2 11/11/2019   No results found for: CHOL, HDL, LDLCALC, LDLDIRECT, TRIG, CHOLHDL  Lab Results  Component Value Date   HGBA1C 6.2 (H) 06/13/2020   Lab Results  Component Value Date   TSH 0.751 06/21/2018     STUDIES/PROCEDURES reviewed today   TTE (06/21/2018): Normal LV size.  LVEF 50-55%.  Normal RV size and function.  No significant valvular abnormality seen, though evaluation was limited by cardiac and suboptimal acoustic windows.  Echo 06/2018 1. The left ventricle has low normal systolic function, with an ejection  fraction of 50-55%. The cavity size was normal. Left ventricular diastolic  function could not be evaluated due to nondiagnostic images.  2. Evaluation of LVEF is limited due to tachycardia and poor apical  windows. Consider limited echo once atrial flutter/tachycardia has  resolved.  3. The right ventricle has normal systolic function. The cavity was  normal. There is no increase in right ventricular wall thickness.  4. Left atrial size was not well visualized.  5. The mitral valve was not well visualized. Mild thickening of the  mitral valve leaflet.  6. The aortic valve is tricuspid. Mild thickening of the aortic valve.  Mild calcification of the aortic valve. Aortic valve regurgitation was not  assessed by color flow Doppler.  7. The interatrial septum was not well visualized.   [place] RA bilateral Summary:  Largest Aortic Diameter: 2.1 cm  Renal:  Right: Normal size right kidney. Normal right Resisitive Index.     Normal cortical thickness of right kidney. No evidence of     right renal artery stenosis. RRV flow present.  Left: LRV flow present. No evidence of left renal artery stenosis.     Normal size of  left kidney. Normal left Resistive Index.     Normal cortical thickness of the left kidney.  Mesenteric:  Normal Celiac artery and Superior Mesenteric artery findings.   Assessment & Plan    Essential hypertension, poorly controlled, goal BP 130/80 or lower --Clinic BP still sub-optimal. Home BP cuff obtained with SBP 140s at home and BP today 142.  He has weaned off of hydralazine 50 TID.  Unfortunately, he is maxed out on his combination olmesartan-amlodipine-HCTZ.  We discussed that this medication will be discontinued, given suspect BP will be better controlled if we can transition his ARB to [facility]  Also discussed was that Lasix was more appropriate than HCTZ, given his current renal function. Of note, renal artery [place] without stenosis.  Discontinue current combination olmesartan-amlodipine-HCTZ.  Start Entresto 49-51 twice daily if Cr/K allow on recheck of BMET.  Medication assistance form completed for Entresto.  Repeat BMET at RTC to reassess Cr/K.  Continue amlodipine 10 mg daily. Continue Imdur  daily.  Defer escalation of dose for now. He is tolerating retrial of Imdur well without recurrence of previously reported HA- will defer increase of dose for now to avoid repeat HA on Imdur at this time. Reassess at RTC.  Discontinue HCTZ, given his current renal function.  Start furosemide 20 mg daily in place of HCTZ.  Recheck of BMET today and at RTC.  Future considerations, if ongoing elevated BP at RTC +/- Entresto not tolerated by Cr/K:  Restart of BB. Coreg discontinued by pt as unable to afford. Restart of alternative BB (metoprolol) deferred for SB at last visit. Room in HR today; however, we will continue to defer restart for now to avoid multiple medication changes at once and ensure no recurrent SB at RTC. If  room in HR at RTC, recommend BB start. Start of Coreg would be most ideal, given his elevated BP, though he may need repeat medication assistance forms  completed to be able to afford this going forward.  Escalation of furosemide as indicated +/- as Cr allows.   Of note, spironolactone contraindicated at this time, given history of elevated K+ on recent labs.   Consider escalation of Imdur with close monitoring for recurrent HA for further BP support.  Continue to monitor BP at home.   Salt and fluid restrictions reviewed.  AOC HFmrEF --Denies s/sx of volume overload though abdominal distention noted on exam. Previous EF 50-55% in atrial tachycardia. Reassess limited echo at RTC if can confirm in NSR with a repeat EKG (no EKG obtained today). As above, discontinued olmesartan-amlodipine-HCTZ combination pill. Started Entresto 49-51 BID with medication assistance form completed. Continue amlodipine 10mg  daily and reassess start of BB (ideally Coreg, if financials allow) at RTC as above and with possible need for repeat med assistance forms completed. Discontinue HCTZ given renal function and started lasix to assist with BP and volume status today, given this is a loop diuretic and thus more ideal with current renal function. Continue Imdur to be escalated as tolerated in the future if further BP support needed. Order limited echo at RTC. If EF still reduced on limited echo or WMA, recommend discussion of further ischemic workup at that time.  Paroxysmal atrial flutter with RVR --No EKG today  - recommend EKG at RTC.  He reports that he has not had any racing heart rate or palpitations. He is no longer on Toprol, due to confusion when called about a result note. He was started on Coreg after that time but could not afford this medication. Will defer addition of BB given history of SB and multiple other medication changes today.  He continues Eliquis 5 mg twice daily with CHA2DS2VASc score of at least 2 (HTN, age). [facility] and confirmed by pt today. No s/sx of bleeding. Continue OAC. Recommend limited echo at RTC  recommended once EKG obtained and able to confirm maintaining NSR, given previous EF reduced.  History of alcohol use, tobacco use --No further alcohol use. Tobacco cessation discussed.   Financial assistance --Medication management obtained for Eliquis. Entresto medication assistance form completed today. Was unable to afford Coreg in the past with need for BB as above (and Coreg most ideal). Will investigate options for medication assistance for Coreg at RTC.  Elevated A1C --Recommend establish with PCP and glycemic control for cardiovascular and renal health.   Anemia --Likely of chronic dz. Denies s/sx of bleeding. Continue to monitor.   ------------------------------------  Medication changes:  --Stop HCTZ and olmesartan (from combination ARB-amlodipine-HCTZ pill).  --Start lasix 20mg  daily.  ---Start Entresto 49-51 BID.  Labs ordered:  --BMET now RTC.  Studies / Imaging ordered:  --None. Future considerations:   1--EKG needed to confirm NSR before ordering echo 2--Limited echo ordered at RTC.  3--BMET at RTC given rx changes above 4--If further BP support needed- > 4a Start Coreg (if covered - may need medication assistance form again)  > 4b Start Toprol if unable to start Coreg.  > 4c Escalate Imdur if able to tolerate without HA.  > 4d Escalate Entresto if Cr/K allows.  5--Ensure he has nephrologist (given Cr) and PCP (given A1C) EKG at RTC?   --Yes, pt will need EKG at RTC to determine if NSR as can perform repeat limited echo to  reassess EF at that time. If EF still reduced in NSR, will need further ischemic workup.  Disposition: 2-4  weeks   Lennon Alstrom, PA-C

## 2020-08-18 NOTE — Progress Notes (Signed)
Office Visit    Patient Name: Jacob Waters Date of Encounter: 08/19/2020  Primary Care Provider:  Patient, No Pcp Per (Inactive) Primary Cardiologist:  Yvonne Kendall, MD  Chief Complaint    Chief Complaint  Patient presents with   Follow-up   65 year old male with history of hypertension, h/o polysubstance use including alcohol use, paroxysmal atrial flutter (06/2018), and seen today for BP follow-up.  Past Medical History    Past Medical History:  Diagnosis Date   Alcohol abuse    Atrial flutter (HCC)    Hypertension    Past Surgical History:  Procedure Laterality Date   NO PAST SURGERIES      Allergies  No Known Allergies  History of Present Illness    Jacob Waters is a 65 y.o. male with PMH as above.     He was admitted 06/2018 and found to be in atrial flutter with variable block and frequently elevated ventricular rates.  He was noted to have elevated blood alcohol level at that time.  He was placed on IV diltiazem and oral metoprolol for rate control.  He converted to NSR on metoprolol tartrate 100 mg twice daily and ASA 81 mg daily, as well as amlodipine 10 mg daily for blood pressure control.  He was seen after discharge via telemedicine 06/29/2018 and reported getting more tired than usual when compared with a few years ago.  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    Seen 08/19/2018 and bradycardic. He was transitioned to metoprolol succinate 100 mg daily to encourage compliance given medication confusion regarding dosing of BB.  Alcohol cessation was advised.  He was continued on Toprol and amlodipine.   11/12/2019, he presented to [facility] and EKG showed atrial flutter with ventricular rates into the 150s.  He had run out of his medications, which were restarted. He was hypertensive.  Toprol reduced for bradycardia.  He was started on CIWA protocol for alcohol use at 24 pk of beers daily x2 weeks.  Eliquis 5 mg twice daily started.     Seen September and October 2021- Samples and coupon provided for Eliquis.  Medication management was also contacted for assistance with hydralazine, metoprolol, and amlodipine.  Hydralazine was increased to 100 mg 3 times daily for continued elevated pressure at visits with patient report that he did not feel well on this dose.  Home SBP 140s to 180s.  He quit EtOH. He was still using tobacco/chewing tobacco.  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   Seeb 03/28/2020 and HCTZ daily started for HTN. If BP remained difficult to control, secondary hypertension work-up recommended.  RAS workup without significant findings.   Seen 05/01/2020 and 05/22/2020 with report of difficulty obtaining medications. Home SBP 140s to 150s / DBP 60s to 70s.  He was weaned off of hydralazine 2/23 and onto combination olmesartan/amlodipine/HCTZ.    Seen 06/13/2020 and could not afford Coreg.  Toprol deferred.Marland Kitchen He was started on Imdur.    06/2020 echo with EF 60 to 65%, NR WMA, mild LVH, mild LAE, mild aortic valve sclerosis without stenosis.  Seen 07/15/20 and trying to increase activity as previously recommended. He was not drinking alcohol but did report ongoing tobacco use.  He was walking several [place] and tolerating this well.  BP 142/64, despite addition of Imdur.  He had recently had steak and eggs from [employer].  He was planning to cut out salt and eat more  fish moving forward.  No HA with Imudr.  Medication assistance form completed. He was monitoring BP at home on average 142/70 with lowest SBP is 137.  HR 60s.  He was typically asymptomatic with elevated pressures.   He was started on Entresto 49-51 BID and continued on amlodipine 10mg  daily and Imdur 30mg  daily. HCTZ discontinued. Lasix 20mg  daily started. BB deferred.   Today, 08/19/2020, he returns to clinic and notes that he is doing well from a cardiac standpoint.  He does not have any blood pressure readings today and notes that his  niece has been monitoring his blood pressure at home.  BP noted to be elevated today with patient report that he did not have any salt and ate a boiled egg without salt before coming into clinic.  He took his blood pressure medications at 8 AM today.  He denies any chest pain at rest or with exertion.  No shortness of breath at rest or with exertion.  He does note feeling tired all last week without clear etiology.  He denies any alcohol use.  He is not smoking but he does use chew tobacco.  No orthopnea, PND, early satiety.  He denies any abdominal distention.  No lower extremity edema reported.  He reports medication compliance.  Entresto fell off his medication list and will be added back to it.  He is also receiving Eliquis through medication assistance.  He reports adequate urine output on his Lasix.  Weight today down from that of previous clinic weight by 3 pounds.  He denies any signs or symptoms of bleeding.  He denies any signs or symptoms of sleep apnea, assessed today.  He asks today if he can start a multivitamin with recommendation to hold off until repeat labs obtained, given we will want him to be careful with any potassium he takes through OTC supplements.  Home Medications    Prior to Admission medications   Medication Sig Start Date End Date Taking? Authorizing Provider  amLODipine (NORVASC) 10 MG tablet Take 1 tablet (10 mg total) by mouth daily. 11/14/19 12/14/19  Shahmehdi, 08/21/2020, MD  apixaban (ELIQUIS) 5 MG TABS tablet Take 1 tablet (5 mg total) by mouth 2 (two) times daily. 11/14/19 12/14/19  ShahmehdiGemma Payor, MD  aspirin EC 81 MG tablet Take 1 tablet (81 mg total) by mouth daily. Patient not taking: Reported on 11/12/2019 06/22/18   Gemma Payor, MD  hydrALAZINE (APRESOLINE) 50 MG tablet Take 1 tablet (50 mg total) by mouth every 8 (eight) hours. 11/14/19 12/14/19  Milagros Loll, MD  metoprolol succinate (TOPROL-XL) 50 MG 24 hr tablet Take 1 tablet (50 mg total) by mouth daily  with breakfast. Take with or immediately following a meal. 11/15/19 12/15/19  Shahmehdi, Kendell Bane, MD  Multiple Vitamin (MULTIVITAMIN WITH MINERALS) TABS tablet Take 1 tablet by mouth daily. 11/15/19 12/15/19  Gemma Payor, MD    Review of Systems    He denies chest pain, palpitations, dyspnea, pnd, orthopnea, n, v, dizziness, syncope, edema, weight gain, or early satiety.  He reports fatigue last week, resolved this week.  All other systems reviewed and are otherwise negative except as noted above.  Physical Exam    VS:  BP (!) 162/74 (BP Location: Left Arm, Patient Position: Sitting, Cuff Size: Normal)   Pulse (!) 59   Ht 5\' 5"  (1.651 m)   Wt 174 lb (78.9 kg)   SpO2 99%   BMI 28.96 kg/m  , BMI Body  mass index is 28.96 kg/m. GEN: Well nourished, well developed, in no acute distress. HEENT: normal. Neck: Supple, no JVD, carotid bruits, or masses. Cardiac: bradycardic but regular, 1/6 systolc murmur. No rubs, or gallops. No clubbing, cyanosis, edema.  Radials/DP/PT 2+ and equal bilaterally.  Respiratory:  Respirations regular and unlabored, clear to auscultation bilaterally. GI: Slightly distended/firm, BS + x 4. MS:  no deformity or atrophy. Skin: warm and dry, no rash. Neuro:  Strength and sensation are intact. Psych: Normal affect.  Accessory Clinical Findings    ECG personally reviewed by me today -sinus bradycardia, 59 bpm, PR interval 150 ms, QTC 396 ms, poor R wave progression in leads III, nonspecific t wave abnormality- no acute changes.  VITALS Reviewed today   Temp Readings from Last 3 Encounters:  11/14/19 98.7 F (37.1 C)  06/22/18 98.9 F (37.2 C) (Axillary)   BP Readings from Last 3 Encounters:  08/19/20 (!) 162/74  07/15/20 (!) 142/64  06/13/20 (!) 148/68   Pulse Readings from Last 3 Encounters:  08/19/20 (!) 59  07/15/20 86  06/13/20 (!) 56    Wt Readings from Last 3 Encounters:  08/19/20 174 lb (78.9 kg)  07/15/20 177 lb (80.3 kg)  06/13/20 177 lb  (80.3 kg)     LABS  reviewed today   Lab Results  Component Value Date   WBC 4.1 12/06/2019   HGB 12.1 (L) 12/06/2019   HCT 36.0 (L) 12/06/2019   MCV 89 12/06/2019   PLT 433 12/06/2019   Lab Results  Component Value Date   CREATININE 1.34 (H) 06/13/2020   BUN 26 06/13/2020   NA 138 06/13/2020   K 4.7 06/13/2020   CL 101 06/13/2020   CO2 22 06/13/2020   Lab Results  Component Value Date   ALT 33 11/11/2019   AST 64 (H) 11/11/2019   ALKPHOS 74 11/11/2019   BILITOT 1.2 11/11/2019   No results found for: CHOL, HDL, LDLCALC, LDLDIRECT, TRIG, CHOLHDL  Lab Results  Component Value Date   HGBA1C 6.2 (H) 06/13/2020   Lab Results  Component Value Date   TSH 0.751 06/21/2018     STUDIES/PROCEDURES reviewed today   Echo 06/18/20  1. Left ventricular ejection fraction, by estimation, is 60 to 65%. The  left ventricle has normal function. The left ventricle has no regional  wall motion abnormalities. There is mild left ventricular hypertrophy.  Left ventricular diastolic parameters  were normal.   2. Right ventricular systolic function is normal. The right ventricular  size is normal. There is normal pulmonary artery systolic pressure.   3. Left atrial size was mildly dilated.   4. The mitral valve is normal in structure. No evidence of mitral valve  regurgitation.   5. The aortic valve is tricuspid. Aortic valve regurgitation is not  visualized. Mild aortic valve sclerosis is present, with no evidence of  aortic valve stenosis.   6. The inferior vena cava is normal in size with greater than 50%  respiratory variability, suggesting right atrial pressure of 3 mmHg.    TTE (06/21/2018): Normal LV size.  LVEF 50-55%.  Normal RV size and function.  No significant valvular abnormality seen, though evaluation was limited by cardiac and suboptimal acoustic windows.  Echo 06/2018  1. The left ventricle has low normal systolic function, with an ejection  fraction of 50-55%. The  cavity size was normal. Left ventricular diastolic  function could not be evaluated due to nondiagnostic images.   2. Evaluation of LVEF is  limited due to tachycardia and poor apical  windows. Consider limited echo once atrial flutter/tachycardia has  resolved.   3. The right ventricle has normal systolic function. The cavity was  normal. There is no increase in right ventricular wall thickness.   4. Left atrial size was not well visualized.   5. The mitral valve was not well visualized. Mild thickening of the  mitral valve leaflet.   6. The aortic valve is tricuspid. Mild thickening of the aortic valve.  Mild calcification of the aortic valve. Aortic valve regurgitation was not  assessed by color flow Doppler.   7. The interatrial septum was not well visualized.   [place] RA bilateral Summary:  Largest Aortic Diameter: 2.1 cm  Renal:  Right: Normal size right kidney. Normal right Resisitive Index.         Normal cortical thickness of right kidney. No evidence of         right renal artery stenosis. RRV flow present.  Left:  LRV flow present. No evidence of left renal artery stenosis.         Normal size of left kidney. Normal left Resistive Index.         Normal cortical thickness of the left kidney.  Mesenteric:  Normal Celiac artery and Superior Mesenteric artery findings.   Assessment & Plan    Essential hypertension, poorly controlled, goal BP 130/80 or lower --BP today suboptimal, despite taking his Entresto this morning.  He has has been weaned off of hydralazine 50 TID. At his last visit, he was started on Entresto and lasix with HCTZ discontinued. Of note, renal artery [place] without stenosis. Medication assistance form was completed for Entresto. Increased to Entresto 97-103 twice daily. Repeat BMET today, BMET ~ 2w. Continue amlodipine 10 mg daily.  Continue Imdur 30mg  daily. To date, no recurrent HA on Imdur Continue furosemide 20 mg.  Future considerations, if ongoing  elevated BP  Escalation of furosemide as indicated +/- as Cr allows.  Consider escalation of Imdur with close monitoring for recurrent HA for further BP support. No spironolactone, given history of elevated K+ on recent labs.  No beta-blocker, given bradycardia.   Continue to monitor BP at home. Stressed importance of home monitoring. Salt and fluid restrictions reviewed.  Chronic diastolic heart failure --Denies dyspnea or any signs or symptoms consistent with volume overload. --Most recent EF 60-654% with mild LVH.  Continue increased Entresto 97-103 BID - medication assistance form completed at last visit.  Repeat BMET today, BMET in 2 weeks. Continue amlodipine 10mg  daily  Continue lasix and Imdur. Escalate at RTC or between visits if needed and as tolerated.  Paroxysmal atrial flutter with RVR --No racing heart rate or palpitations. No s/sx of bleeding on OAC.  Beta-blocker contraindicated by bradycardic rate. Continue Eliquis 5 mg twice daily with CHA2DS2VASc score of at least 2 (HTN, age).   History of alcohol use, current tobacco/chew use --No further alcohol use. He has quit smoking.  He is still using chewing tobacco with work. Talked about substituting with chewing gum.  Complete cessation recommended.  Financial assistance --Medication management obtained for Eliquis, Entresto. Was unable to afford Coreg in the past with need for BB as above (and Coreg most ideal).   Elevated A1C --Recommend establish with PCP and glycemic control for cardiovascular and renal health.   Anemia --Likely of chronic dz. Denies s/sx of bleeding. Continue to monitor.   ------------------------------------    Medication changes: Increase to Entresto 97-1 03 twice daily,  possible up titration of Lasix Labs ordered: BMET today and in 2 weeks  *Let him know about starting a multivitamin pending BMET / K+ *Let him know about Lasix pending BMET Studies / Imaging ordered: None Future  considerations: Up titration of Lasix/Imdur if needed for future BP support Disposition: RTC 4 months  *Please be aware that the above documentation was completed voice recognition software and may contain associated dictation errors.     Lennon Alstrom, PA-C

## 2020-08-19 ENCOUNTER — Encounter: Payer: Self-pay | Admitting: Physician Assistant

## 2020-08-19 ENCOUNTER — Ambulatory Visit (INDEPENDENT_AMBULATORY_CARE_PROVIDER_SITE_OTHER): Payer: Medicare Other | Admitting: Physician Assistant

## 2020-08-19 ENCOUNTER — Other Ambulatory Visit: Payer: Self-pay

## 2020-08-19 VITALS — BP 162/74 | HR 59 | Ht 65.0 in | Wt 174.0 lb

## 2020-08-19 DIAGNOSIS — I5023 Acute on chronic systolic (congestive) heart failure: Secondary | ICD-10-CM

## 2020-08-19 DIAGNOSIS — I5032 Chronic diastolic (congestive) heart failure: Secondary | ICD-10-CM

## 2020-08-19 DIAGNOSIS — N1831 Chronic kidney disease, stage 3a: Secondary | ICD-10-CM

## 2020-08-19 DIAGNOSIS — Z79899 Other long term (current) drug therapy: Secondary | ICD-10-CM

## 2020-08-19 DIAGNOSIS — I10 Essential (primary) hypertension: Secondary | ICD-10-CM | POA: Diagnosis not present

## 2020-08-19 DIAGNOSIS — Z7901 Long term (current) use of anticoagulants: Secondary | ICD-10-CM

## 2020-08-19 DIAGNOSIS — I4892 Unspecified atrial flutter: Secondary | ICD-10-CM | POA: Diagnosis not present

## 2020-08-19 DIAGNOSIS — R7309 Other abnormal glucose: Secondary | ICD-10-CM

## 2020-08-19 DIAGNOSIS — Z720 Tobacco use: Secondary | ICD-10-CM

## 2020-08-19 DIAGNOSIS — Z87898 Personal history of other specified conditions: Secondary | ICD-10-CM

## 2020-08-19 DIAGNOSIS — D638 Anemia in other chronic diseases classified elsewhere: Secondary | ICD-10-CM

## 2020-08-19 MED ORDER — APIXABAN 5 MG PO TABS: 5.0000 mg | ORAL_TABLET | Freq: Two times a day (BID) | ORAL | 3 refills | Status: DC

## 2020-08-19 MED ORDER — ENTRESTO 97-103 MG PO TABS: 1.0000 | ORAL_TABLET | Freq: Two times a day (BID) | ORAL | 3 refills | Status: DC

## 2020-08-19 NOTE — Patient Instructions (Signed)
Medication Instructions:  Your physician has recommended you make the following change in your medication:   INCREASE Entresto to 97/103 mg twice a day Refill sent in for your Eliquis 5 mg twice a day  *If you need a refill on your cardiac medications before your next appointment, please call your pharmacy*   Lab Work: BMET today.    Then in one month (July 13 th) we would like you to have repeat labs. Go to [facility] at [facility] and go to 1st desk on the right to check in, past the screening table. No appointment is needed for this.  Lab hours: Monday- Friday (7:30 am- 5:30 pm)  If you have labs (blood work) drawn today and your tests are completely normal, you will receive your results only by: MyChart Message (if you have MyChart) OR A paper copy in the mail If you have any lab test that is abnormal or we need to change your treatment, we will call you to review the results.   Testing/Procedures: None   Follow-Up: At [facility], you and your health needs are our priority.  As part of our continuing mission to provide you with exceptional heart care, we have created designated Provider Care Teams.  These Care Teams include your primary Cardiologist (physician) and Advanced Practice Providers (APPs -  Physician Assistants and Nurse Practitioners) who all work together to provide you with the care you need, when you need it.  We recommend signing up for the patient portal called "MyChart".  Sign up information is provided on this After Visit Summary.  MyChart is used to connect with patients for Virtual Visits (Telemedicine).  Patients are able to view lab/test results, encounter notes, upcoming appointments, etc.  Non-urgent messages can be sent to your provider as well.   To learn more about what you can do with MyChart, go to [email or web address].    Your next appointment:   4 month(s)  The format for your next appointment:   In Person  Provider:   Yvonne Kendall, MD or Marisue Ivan, PA-C   Other Instructions Your physician has requested that you regularly monitor and record your blood pressure readings at home. Please use the same machine at the same time of day to check your readings and record them then call us with those readings.   We would like your blood pressures to be less than 130/80 and if they persistently are higher than this to please give [place] a call.  Please monitor blood pressures and keep a log of your readings.   Make sure to check 2 hours after your medications.   AVOID these things for 30 minutes before checking your blood pressure: No Drinking caffeine. No Drinking alcohol. No Eating. No Smoking. No Exercising.  Five minutes before checking your blood pressure: Pee. Sit in a dining chair. Avoid sitting in a soft couch or armchair. Be quiet. Do not talk.

## 2020-08-20 LAB — BASIC METABOLIC PANEL
BUN/Creatinine Ratio: 10 (ref 10–24)
BUN: 13 mg/dL (ref 8–27)
CO2: 22 mmol/L (ref 20–29)
Calcium: 9.5 mg/dL (ref 8.6–10.2)
Chloride: 102 mmol/L (ref 96–106)
Creatinine, Ser: 1.24 mg/dL (ref 0.76–1.27)
Glucose: 93 mg/dL (ref 65–99)
Potassium: 4.5 mmol/L (ref 3.5–5.2)
Sodium: 138 mmol/L (ref 134–144)
eGFR: 65 mL/min/{1.73_m2} (ref >59)

## 2020-08-26 ENCOUNTER — Telehealth: Payer: Self-pay | Admitting: *Deleted

## 2020-08-26 NOTE — Telephone Encounter (Signed)
No answer/No voicemail box set up.  

## 2020-08-26 NOTE — Telephone Encounter (Signed)
-----   Message from Lennon Alstrom, PA-C sent at 08/21/2020 10:43 PM EDT ----- [facility] [place] news! Renal function and electrolytes stable on baseline labs for increased dose Entresto. No additional recommendations.

## 2020-08-27 NOTE — Telephone Encounter (Signed)
Reviewed results and recommendations with patient. He verbalized understanding with no further questions at this time.  

## 2020-09-18 ENCOUNTER — Other Ambulatory Visit
Admission: RE | Admit: 2020-09-18 | Discharge: 2020-09-18 | Disposition: A | Discharge status: Home / Self Care | Payer: Medicare Other | Attending: Physician Assistant | Admitting: Physician Assistant

## 2020-09-18 DIAGNOSIS — Z79899 Other long term (current) drug therapy: Secondary | ICD-10-CM

## 2020-09-18 DIAGNOSIS — Z7901 Long term (current) use of anticoagulants: Secondary | ICD-10-CM

## 2020-09-18 DIAGNOSIS — I4892 Unspecified atrial flutter: Secondary | ICD-10-CM | POA: Diagnosis present

## 2020-09-18 DIAGNOSIS — I5032 Chronic diastolic (congestive) heart failure: Secondary | ICD-10-CM | POA: Insufficient documentation

## 2020-09-18 LAB — BASIC METABOLIC PANEL
Anion gap: 7 (ref 5–15)
BUN: 19 mg/dL (ref 8–23)
CO2: 25 mmol/L (ref 22–32)
Calcium: 9.1 mg/dL (ref 8.9–10.3)
Chloride: 108 mmol/L (ref 98–111)
Creatinine, Ser: 1.41 mg/dL — ABNORMAL HIGH (ref 0.61–1.24)
GFR, Estimated: 55 mL/min — ABNORMAL LOW (ref >60)
Glucose, Bld: 106 mg/dL — ABNORMAL HIGH (ref 70–99)
Potassium: 3.9 mmol/L (ref 3.5–5.1)
Sodium: 140 mmol/L (ref 135–145)

## 2020-10-15 ENCOUNTER — Telehealth: Payer: Self-pay

## 2020-10-15 NOTE — Telephone Encounter (Signed)
Patient has been approved for patient assistance for Entresto 97-103 mg for the remainder of the calendar year.  Patient has been notified of this as well.

## 2020-10-30 ENCOUNTER — Telehealth: Payer: Self-pay | Admitting: Internal Medicine

## 2020-10-30 ENCOUNTER — Other Ambulatory Visit: Payer: Self-pay

## 2020-10-30 MED ORDER — FUROSEMIDE 20 MG PO TABS: 20.0000 mg | ORAL_TABLET | Freq: Every day | ORAL | 3 refills | Status: DC

## 2020-10-30 MED ORDER — ENTRESTO 97-103 MG PO TABS: 1.0000 | ORAL_TABLET | Freq: Two times a day (BID) | ORAL | 3 refills | Status: DC

## 2020-10-30 MED ORDER — AMLODIPINE BESYLATE 10 MG PO TABS: 10.0000 mg | ORAL_TABLET | Freq: Every day | ORAL | 3 refills | Status: DC

## 2020-10-30 NOTE — Telephone Encounter (Signed)
*  STAT* If patient is at the pharmacy, call can be transferred to refill team.   1. Which medications need to be refilled? (please list name of each medication and dose if known)  Entresto 97-103 1 tablet 2 times daily  Furosemide 20 MG 1 tablet daily  Amlodipine 10 MG 1 tablet daily   2. Which pharmacy/location (including street and city if local pharmacy) is medication to be sent to? [facility] on [address]   3. Do they need a 30 day or 90 day supply? 90 day

## 2020-10-30 NOTE — Telephone Encounter (Signed)
Requested Prescriptions   Signed Prescriptions Disp Refills   amLODipine (NORVASC) 10 MG tablet 90 tablet 3    Sig: Take 1 tablet (10 mg total) by mouth daily.    Authorizing Provider: Marisue Ivan D    Ordering User: Margrett Rud   furosemide (LASIX) 20 MG tablet 90 tablet 3    Sig: Take 1 tablet (20 mg total) by mouth daily.    Authorizing Provider: Marisue Ivan D    Ordering User: Margrett Rud   sacubitril-valsartan (ENTRESTO) 97-103 MG 180 tablet 3    Sig: Take 1 tablet by mouth 2 (two) times daily.    Authorizing Provider: Marisue Ivan D    Ordering User: Margrett Rud

## 2020-12-20 ENCOUNTER — Encounter: Payer: Self-pay | Admitting: Physician Assistant

## 2020-12-20 ENCOUNTER — Ambulatory Visit (INDEPENDENT_AMBULATORY_CARE_PROVIDER_SITE_OTHER): Payer: Medicare Other | Admitting: Physician Assistant

## 2020-12-20 ENCOUNTER — Other Ambulatory Visit: Payer: Self-pay

## 2020-12-20 VITALS — BP 140/80 | HR 55 | Ht 65.0 in | Wt 169.0 lb

## 2020-12-20 DIAGNOSIS — I10 Essential (primary) hypertension: Secondary | ICD-10-CM

## 2020-12-20 DIAGNOSIS — Z7901 Long term (current) use of anticoagulants: Secondary | ICD-10-CM | POA: Diagnosis not present

## 2020-12-20 DIAGNOSIS — Z87898 Personal history of other specified conditions: Secondary | ICD-10-CM | POA: Diagnosis not present

## 2020-12-20 DIAGNOSIS — D638 Anemia in other chronic diseases classified elsewhere: Secondary | ICD-10-CM

## 2020-12-20 DIAGNOSIS — Z720 Tobacco use: Secondary | ICD-10-CM

## 2020-12-20 DIAGNOSIS — I4892 Unspecified atrial flutter: Secondary | ICD-10-CM | POA: Diagnosis not present

## 2020-12-20 DIAGNOSIS — Z79899 Other long term (current) drug therapy: Secondary | ICD-10-CM

## 2020-12-20 DIAGNOSIS — I5032 Chronic diastolic (congestive) heart failure: Secondary | ICD-10-CM

## 2020-12-20 DIAGNOSIS — R7309 Other abnormal glucose: Secondary | ICD-10-CM

## 2020-12-20 MED ORDER — ISOSORBIDE MONONITRATE ER 30 MG PO TB24: 30.0000 mg | ORAL_TABLET | Freq: Every day | ORAL | 3 refills | Status: DC

## 2020-12-20 MED ORDER — FUROSEMIDE 20 MG PO TABS: 20.0000 mg | ORAL_TABLET | Freq: Every day | ORAL | 3 refills | Status: DC

## 2020-12-20 MED ORDER — ENTRESTO 97-103 MG PO TABS: 1.0000 | ORAL_TABLET | Freq: Two times a day (BID) | ORAL | 3 refills | Status: DC

## 2020-12-20 MED ORDER — AMLODIPINE BESYLATE 10 MG PO TABS: 10.0000 mg | ORAL_TABLET | Freq: Every day | ORAL | 3 refills | Status: DC

## 2020-12-20 MED ORDER — APIXABAN 5 MG PO TABS: 5.0000 mg | ORAL_TABLET | Freq: Two times a day (BID) | ORAL | 3 refills | Status: DC

## 2020-12-20 NOTE — Patient Instructions (Signed)
Medication Instructions:   Please RESTART IMDUR 30 mg daily  *If you need a refill on your cardiac medications before your next appointment, please call your pharmacy*  Lab Work: None  Testing/Procedures: None  Follow-Up: At [facility], you and your health needs are our priority.  As part of our continuing mission to provide you with exceptional heart care, we have created designated Provider Care Teams.  These Care Teams include your primary Cardiologist (physician) and Advanced Practice Providers (APPs -  Physician Assistants and Nurse Practitioners) who all work together to provide you with the care you need, when you need it.  We recommend signing up for the patient portal called "MyChart".  Sign up information is provided on this After Visit Summary.  MyChart is used to connect with patients for Virtual Visits (Telemedicine).  Patients are able to view lab/test results, encounter notes, upcoming appointments, etc.  Non-urgent messages can be sent to your provider as well.   To learn more about what you can do with MyChart, go to [email or web address].    Your next appointment:   6 month(s)  The format for your next appointment:   In Person  Provider:   You may see Yvonne Kendall, MD or one of the following Advanced Practice Providers on your designated Care Team:   Nicolasa Ducking, NP Eula Listen, PA-C Cadence Fransico Michael, [place]

## 2020-12-20 NOTE — Progress Notes (Signed)
Office Visit    Patient Name: Jacob Waters Date of Encounter: 12/20/2020  Primary Care Provider:  Patient, No Pcp Per (Inactive) Primary Cardiologist:  Yvonne Kendall, MD  Chief Complaint    Chief Complaint  Patient presents with   Follow-up    4 Months follow up and medications verbally reviewed with patient.     65 y.o. male  with history of hypertension, h/o polysubstance use including alcohol use, paroxysmal atrial flutter (06/2018), and seen today for BP follow-up.  Past Medical History    Past Medical History:  Diagnosis Date   Alcohol abuse    Atrial flutter (HCC)    Hypertension    Past Surgical History:  Procedure Laterality Date   NO PAST SURGERIES      Allergies  No Known Allergies  History of Present Illness    Jacob Waters is a 65 y.o. male with PMH as above.     He was admitted 06/2018 and found to be in atrial flutter with variable block and frequently elevated ventricular rates.  He was noted to have elevated blood alcohol level at that time.  He was placed on IV diltiazem and oral metoprolol for rate control.  At follow-up, he was more tired than usual when compared with a few years ago.  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  In 11/2019, he presented to [facility] and EKG showed atrial flutter with ventricular rates into the 150s.  He had run out of his medications, which were restarted. He was hypertensive.  Toprol reduced for bradycardia.  He was started on CIWA protocol for alcohol use at 24 pk of beers daily x2 weeks.  Eliquis 5 mg twice daily started.    He has history of difficulty in obtaining medications.  Seen September and October 2021- Samples and coupon provided for Eliquis.  Medication management was also contacted for assistance with hydralazine, metoprolol, and amlodipine.  Hydralazine was increased to 100 mg 3 times daily for continued elevated pressure at visits with patient report that he did not feel well on this  dose.  Home SBP 140s to 180s.  He quit EtOH. He was still using tobacco/chewing tobacco.  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   For ongoing elevated BP, he completed RAS workup without significant findings.  Seen 06/13/2020 and could not afford Coreg.   06/2020 echo with EF 60 to 65%, NR WMA, mild LVH, mild LAE, mild aortic valve sclerosis without stenosis.  Seen 07/15/20 and trying to increase activity.  No alcohol use reported but he did report ongoing tobacco use.  He was walking several [place] and tolerating this well.  He was trying to cut out salt.  No HA with Imudr.  Medication assistance form completed.  He was asymptomatic with elevated BP. He was started on Entresto 49-51 BID and continued on amlodipine 10mg  daily and Imdur 30mg  daily. HCTZ discontinued. Lasix 20mg  daily started. Seen 08/19/2020 with BP elevated on his BP medications.  He reported feeling tired all week without clear etiology.  He was not smoking or drinking alcohol.  He did note chewing tobacco.  Entresto had fallen off his list and was restarted with dose increased to 97-103 BID.    Today, 12/20/2020, he returns to clinic and notes that he has been doing well since his June appointment.  He reports a recent head cold for which he intends to take Coricidin.  He is eating  out a lot and asks about low-salt options.  He is eating a grilled chicken salad with ranch with caution recommended regarding salad dressings and preference for oral and vinegar over that of any high salt dressings.  He reports home BP 130 -140/65.  He has no regular workout routine but remains active with his job.  He now Works for the town in the parks department.  He denies any chest pain or shortness of breath when walking around the property.  He reports medication compliance, though stopped Imdur as he thought this was recommended with restart recommended as below.  Home Medications    Prior to Admission medications   Medication Sig  Start Date End Date Taking? Authorizing Provider  amLODipine (NORVASC) 10 MG tablet Take 1 tablet (10 mg total) by mouth daily. 11/14/19 12/14/19  Shahmehdi, Gemma Payor, MD  apixaban (ELIQUIS) 5 MG TABS tablet Take 1 tablet (5 mg total) by mouth 2 (two) times daily. 11/14/19 12/14/19  ShahmehdiGemma Payor, MD  aspirin EC 81 MG tablet Take 1 tablet (81 mg total) by mouth daily. Patient not taking: Reported on 11/12/2019 06/22/18   Milagros Loll, MD  hydrALAZINE (APRESOLINE) 50 MG tablet Take 1 tablet (50 mg total) by mouth every 8 (eight) hours. 11/14/19 12/14/19  Kendell Bane, MD  metoprolol succinate (TOPROL-XL) 50 MG 24 hr tablet Take 1 tablet (50 mg total) by mouth daily with breakfast. Take with or immediately following a meal. 11/15/19 12/15/19  Shahmehdi, Gemma Payor, MD  Multiple Vitamin (MULTIVITAMIN WITH MINERALS) TABS tablet Take 1 tablet by mouth daily. 11/15/19 12/15/19  Kendell Bane, MD    Review of Systems    He denies chest pain, palpitations, dyspnea, pnd, orthopnea, n, v, dizziness, syncope, edema, weight gain, or early satiety.    All other systems reviewed and are otherwise negative except as noted above.  Physical Exam    VS:  BP 140/80 (BP Location: Left Arm, Patient Position: Sitting, Cuff Size: Normal)   Pulse (!) 55   Ht 5\' 5"  (1.651 m)   Wt 169 lb (76.7 kg)   SpO2 97%   BMI 28.12 kg/m  , BMI Body mass index is 28.12 kg/m. GEN: Well nourished, well developed, in no acute distress. HEENT: normal. Neck: Supple, no JVD, carotid bruits, or masses. Cardiac: bradycardic but regular, 1/6 systolc murmur. No rubs, or gallops. No clubbing, cyanosis, edema.  Radials/DP/PT 2+ and equal bilaterally.  Respiratory:  Respirations regular and unlabored, clear to auscultation bilaterally. GI: Nontender, not distended, BS + x 4. MS:  no deformity or atrophy. Skin: warm and dry, no rash. Neuro:  Strength and sensation are intact. Psych: Normal affect.  Accessory Clinical Findings    ECG  personally reviewed by me today -sinus bradycardia, 55 bpm, poor R wave progression v lead placement  leads III, baseline wander- no acute changes.  VITALS Reviewed today   Temp Readings from Last 3 Encounters:  11/14/19 98.7 F (37.1 C)  06/22/18 98.9 F (37.2 C) (Axillary)   BP Readings from Last 3 Encounters:  12/20/20 140/80  08/19/20 (!) 162/74  07/15/20 (!) 142/64   Pulse Readings from Last 3 Encounters:  12/20/20 (!) 55  08/19/20 (!) 59  07/15/20 86    Wt Readings from Last 3 Encounters:  12/20/20 169 lb (76.7 kg)  08/19/20 174 lb (78.9 kg)  07/15/20 177 lb (80.3 kg)     LABS  reviewed today   Lab Results  Component Value Date  WBC 4.1 12/06/2019   HGB 12.1 (L) 12/06/2019   HCT 36.0 (L) 12/06/2019   MCV 89 12/06/2019   PLT 433 12/06/2019   Lab Results  Component Value Date   CREATININE 1.41 (H) 09/18/2020   BUN 19 09/18/2020   NA 140 09/18/2020   K 3.9 09/18/2020   CL 108 09/18/2020   CO2 25 09/18/2020   Lab Results  Component Value Date   ALT 33 11/11/2019   AST 64 (H) 11/11/2019   ALKPHOS 74 11/11/2019   BILITOT 1.2 11/11/2019   No results found for: CHOL, HDL, LDLCALC, LDLDIRECT, TRIG, CHOLHDL  Lab Results  Component Value Date   HGBA1C 6.2 (H) 06/13/2020   Lab Results  Component Value Date   TSH 0.751 06/21/2018     STUDIES/PROCEDURES reviewed today   Echo 06/18/20  1. Left ventricular ejection fraction, by estimation, is 60 to 65%. The  left ventricle has normal function. The left ventricle has no regional  wall motion abnormalities. There is mild left ventricular hypertrophy.  Left ventricular diastolic parameters  were normal.   2. Right ventricular systolic function is normal. The right ventricular  size is normal. There is normal pulmonary artery systolic pressure.   3. Left atrial size was mildly dilated.   4. The mitral valve is normal in structure. No evidence of mitral valve  regurgitation.   5. The aortic valve is  tricuspid. Aortic valve regurgitation is not  visualized. Mild aortic valve sclerosis is present, with no evidence of  aortic valve stenosis.   6. The inferior vena cava is normal in size with greater than 50%  respiratory variability, suggesting right atrial pressure of 3 mmHg.    TTE (06/21/2018): Normal LV size.  LVEF 50-55%.  Normal RV size and function.  No significant valvular abnormality seen, though evaluation was limited by cardiac and suboptimal acoustic windows.  Echo 06/2018  1. The left ventricle has low normal systolic function, with an ejection  fraction of 50-55%. The cavity size was normal. Left ventricular diastolic  function could not be evaluated due to nondiagnostic images.   2. Evaluation of LVEF is limited due to tachycardia and poor apical  windows. Consider limited echo once atrial flutter/tachycardia has  resolved.   3. The right ventricle has normal systolic function. The cavity was  normal. There is no increase in right ventricular wall thickness.   4. Left atrial size was not well visualized.   5. The mitral valve was not well visualized. Mild thickening of the  mitral valve leaflet.   6. The aortic valve is tricuspid. Mild thickening of the aortic valve.  Mild calcification of the aortic valve. Aortic valve regurgitation was not  assessed by color flow Doppler.   7. The interatrial septum was not well visualized.   [place] RA bilateral Summary:  Largest Aortic Diameter: 2.1 cm  Renal:  Right: Normal size right kidney. Normal right Resisitive Index.         Normal cortical thickness of right kidney. No evidence of         right renal artery stenosis. RRV flow present.  Left:  LRV flow present. No evidence of left renal artery stenosis.         Normal size of left kidney. Normal left Resistive Index.         Normal cortical thickness of the left kidney.  Mesenteric:  Normal Celiac artery and Superior Mesenteric artery findings.   Assessment & Plan  Essential hypertension, poorly controlled, goal BP 130/80 or lower --BP today still elevated, though improving. Renal artery [place] without stenosis. Continue Entresto 97-103 BID, amlodipine 10 mg daily, restarted Imdur 30mg  daily, furosemide 20 mg. No spironolactone, given history of elevated K+ on recent labs. No beta-blocker, given history of bradycardia. Continue to monitor BP at home. Salt and fluid restrictions reviewed.  Chronic diastolic heart failure --Denies dyspnea or any signs or symptoms consistent with volume overload. Most recent EF 60-654% with mild LVH. Euvolemic and well compensated on exam. Continue Entresto 97-103 BID, amlodipine 10mg  daily, lasix, and Imdur.   Paroxysmal atrial flutter with RVR --No racing heart rate or palpitations. No s/sx of bleeding on OAC.  Beta-blocker contraindicated by bradycardic rate. Continue Eliquis 5 mg twice daily with CHA2DS2VASc score of at least 2 (HTN, age).   History of alcohol use, current tobacco/chew use --No further alcohol use. He has quit smoking but uses chew. Complete cessation recommended.  Financial assistance --Medication management obtained already for Eliquis, Entresto. Was unable to afford Coreg in the past but BB discontinued for bradycardia.   Elevated A1C --06/2020 A1C 6.2. Recommendations per PCP.   Anemia --Likely of chronic dz. Denies s/sx of bleeding. Continue to monitor.   ------------------------------------    Medication changes: Restart Imdur (accidentally self discontinued) Labs ordered: None Studies / Imaging ordered: None Future considerations: Up titration of Lasix/Imdur if needed  Disposition: RTC 6 months  *Please be aware that the above documentation was completed voice recognition software and may contain associated dictation errors.     , PA-C

## 2020-12-23 ENCOUNTER — Other Ambulatory Visit: Payer: Self-pay | Admitting: *Deleted

## 2020-12-23 DIAGNOSIS — I4892 Unspecified atrial flutter: Secondary | ICD-10-CM

## 2020-12-23 DIAGNOSIS — Z7901 Long term (current) use of anticoagulants: Secondary | ICD-10-CM

## 2020-12-23 MED ORDER — APIXABAN 5 MG PO TABS: 5.0000 mg | ORAL_TABLET | Freq: Two times a day (BID) | ORAL | 1 refills | Status: DC

## 2020-12-23 NOTE — Telephone Encounter (Signed)
Eliquis 5mg  paper refill request received to . Patient is 65 years old, weight-76.7kg, Crea-1.41 on 09/18/20, Diagnosis-Afib, and last seen by 09/20/20, PA on 12/20/2020. Dose is appropriate based on dosing criteria. Will send in refill to requested pharmacy.

## 2021-01-16 ENCOUNTER — Other Ambulatory Visit: Payer: Self-pay

## 2021-01-16 MED ORDER — FUROSEMIDE 20 MG PO TABS: 20.0000 mg | ORAL_TABLET | Freq: Every day | ORAL | 1 refills | Status: DC

## 2021-01-16 MED ORDER — AMLODIPINE BESYLATE 10 MG PO TABS: 10.0000 mg | ORAL_TABLET | Freq: Every day | ORAL | 1 refills | Status: DC

## 2021-01-16 MED ORDER — ENTRESTO 97-103 MG PO TABS: 1.0000 | ORAL_TABLET | Freq: Two times a day (BID) | ORAL | 1 refills | Status: DC

## 2021-01-16 NOTE — Telephone Encounter (Signed)
*  STAT* If patient is at the pharmacy, call can be transferred to refill team.   1. Which medications need to be refilled? (please list name of each medication and dose if known)  Entresto/Lasix/Amlodipine  2. Which pharmacy/location (including street and city if local pharmacy) is medication to be sent to? [facility] [address] [place]  3. Do they need a 30 day or 90 day supply? 90

## 2021-06-18 ENCOUNTER — Ambulatory Visit (INDEPENDENT_AMBULATORY_CARE_PROVIDER_SITE_OTHER): Payer: Medicare Other | Admitting: Internal Medicine

## 2021-06-18 ENCOUNTER — Encounter: Payer: Self-pay | Admitting: Internal Medicine

## 2021-06-18 VITALS — BP 156/84 | HR 85 | Ht 65.0 in | Wt 173.0 lb

## 2021-06-18 DIAGNOSIS — I5030 Unspecified diastolic (congestive) heart failure: Secondary | ICD-10-CM | POA: Insufficient documentation

## 2021-06-18 DIAGNOSIS — I10 Essential (primary) hypertension: Secondary | ICD-10-CM

## 2021-06-18 DIAGNOSIS — N1831 Chronic kidney disease, stage 3a: Secondary | ICD-10-CM

## 2021-06-18 DIAGNOSIS — I5032 Chronic diastolic (congestive) heart failure: Secondary | ICD-10-CM | POA: Insufficient documentation

## 2021-06-18 DIAGNOSIS — I4892 Unspecified atrial flutter: Secondary | ICD-10-CM | POA: Diagnosis not present

## 2021-06-18 DIAGNOSIS — Z7901 Long term (current) use of anticoagulants: Secondary | ICD-10-CM

## 2021-06-18 MED ORDER — AMLODIPINE BESYLATE 10 MG PO TABS: 10.0000 mg | ORAL_TABLET | Freq: Every day | ORAL | 1 refills | Status: DC

## 2021-06-18 MED ORDER — APIXABAN 5 MG PO TABS: 5.0000 mg | ORAL_TABLET | Freq: Two times a day (BID) | ORAL | 1 refills | Status: DC

## 2021-06-18 MED ORDER — ENTRESTO 97-103 MG PO TABS: 1.0000 | ORAL_TABLET | Freq: Two times a day (BID) | ORAL | 1 refills | Status: DC

## 2021-06-18 MED ORDER — HYDROCHLOROTHIAZIDE 25 MG PO TABS: 25.0000 mg | ORAL_TABLET | Freq: Every day | ORAL | 1 refills | Status: DC

## 2021-06-18 NOTE — Progress Notes (Signed)
? ?Follow-up Outpatient Visit ?Date: 06/18/2021 ? ?Primary Care Provider: ?Patient, No Pcp Per (Inactive) ?No address on file ? ?Chief Complaint: Follow-up heart failure and hypertension ? ?HPI:  [name] is a 66 y.o. male with history of chronic HFpEF, paroxysmal atrial flutter, hypertension, and polysubstance abuse, who presents for follow-up of chronic HFpEF, atrial flutter, and hypertension.  He was last seen in our office by Marrianne Mood, [place], in 08/2020.  At that time, he was doing well though his blood pressure was moderately elevated at that visit.  Entresto was increased to 97-103 mg twice daily. ? ?Today, [name] that he feels fairly well though he has been bothered by a tooth ache over the last several weeks.  He wonders if his elevated blood pressures could be driven by this.  He notes that his home systolic blood pressures are typically around 160.  He has been tolerating his medications well and Waters being compliant.  He eats out quite frequently and admits that he may be taking in more salt than he should.  He denies chest pain, shortness of breath, lightheadedness, and edema.  He has occasional orthopnea but typically is able to sleep on 1 pillow.  He Waters a single episode of palpitations that lasted only a second and was not associated with any other symptoms. ? ?-------------------------------------------------------------------------------------------------- ? ?Past Medical History:  ?Diagnosis Date  ? Alcohol abuse   ? Atrial flutter ([place])   ? Chronic heart failure with preserved ejection fraction (HFpEF) ([place])   ? Hypertension   ? ?Past Surgical History:  ?Procedure Laterality Date  ? NO PAST SURGERIES    ? ? ?Current Meds  ?Medication Sig  ? amLODipine (NORVASC) 10 MG tablet Take 1 tablet (10 mg total) by mouth daily.  ? apixaban (ELIQUIS) 5 MG TABS tablet Take 1 tablet (5 mg total) by mouth 2 (two) times daily.  ? furosemide (LASIX) 20 MG tablet Take 1 tablet (20 mg total)  by mouth daily.  ? isosorbide mononitrate (IMDUR) 30 MG 24 hr tablet Take 1 tablet (30 mg total) by mouth daily.  ? sacubitril-valsartan (ENTRESTO) 97-103 MG Take 1 tablet by mouth 2 (two) times daily.  ? ? ?Allergies: Patient has no known allergies. ? ?Social History  ? ?Tobacco Use  ? Smoking status: Never  ? Smokeless tobacco: Current  ?  Types: Chew  ?  Last attempt to quit: 05/2018  ?Vaping Use  ? Vaping Use: Never used  ?Substance Use Topics  ? Alcohol use: Not Currently  ?  Alcohol/week: 24.0 standard drinks  ?  Types: 24 Cans of beer per week  ? Drug use: Never  ? ? ?Family History  ?Problem Relation Age of Onset  ? Cancer Mother   ? Hypertension Mother   ? Cancer Father   ? Hypertension Father   ? Hypertension Sister   ? ? ?Review of Systems: ?A 12-system review of systems was performed and was negative except as noted in the HPI. ? ?-------------------------------------------------------------------------------------------------- ? ?Physical Exam: ?BP (!) 156/84 (BP Location: Left Arm, Patient Position: Sitting, Cuff Size: Normal)   Pulse 85   Ht 5\' 5"  (1.651 m)   Wt 173 lb (78.5 kg)   SpO2 98%   BMI 28.79 kg/m?  ? ?General:  NAD. ?Neck: No JVD or HJR. ?Lungs: Clear to auscultation bilaterally without wheezes or crackles. ?Heart: Regular rate and rhythm without murmurs, rubs, or gallops. ?Abdomen: Soft, nontender, nondistended. ?Extremities: No lower extremity edema. ? ?EKG: Normal sinus  rhythm with nonspecific T wave changes.  Heart rate has increased since 12/20/2020.  Otherwise, no significant change. ? ?Lab Results  ?Component Value Date  ? WBC 4.1 12/06/2019  ? HGB 12.1 (L) 12/06/2019  ? HCT 36.0 (L) 12/06/2019  ? MCV 89 12/06/2019  ? PLT 433 12/06/2019  ? ? ?Lab Results  ?Component Value Date  ? NA 140 09/18/2020  ? K 3.9 09/18/2020  ? CL 108 09/18/2020  ? CO2 25 09/18/2020  ? BUN 19 09/18/2020  ? CREATININE 1.41 (H) 09/18/2020  ? GLUCOSE 106 (H) 09/18/2020  ? ALT 33 11/11/2019   ? ? ?-------------------------------------------------------------------------------------------------- ? ?ASSESSMENT AND PLAN: ?Hypertension: ?Blood pressure remains mildly elevated today, though it is somewhat better than at our last visit.  He has been tried on multiple medications in the past.  I think it would be best to try to consolidate his regimen a little bit.  We will therefore discontinue furosemide and restart HCTZ 25 mg daily.  I will check a BMP today as well as in 1 month when he returns for follow-up.  We will continue current doses of amlodipine, Entresto, and isosorbide mononitrate, though I am not sure that isosorbide mononitrate is a great antihypertensive agent in the long-term. ? ?Chronic HFpEF: ?[name] appears euvolemic with NYHA class I symptoms.  In order to improve blood pressure control, we will discontinue furosemide and restart HCTZ 25 mg daily.  Continue Entresto.  Defer rechallenging with a beta-blocker given history of bradycardia.  If you were to develop heart failure symptoms in the future, addition of an SGLT2 inhibitor could be considered. ? ?Atrial flutter: ?Only a single episode of very brief palpitations reported by [name] today.  EKG today again shows sinus rhythm.  Continue apixaban 5 mg twice daily in the setting of a CHA2DS2-VASc score of at least 3 (age, hypertension, and heart failure).  We will check a CBC today given long-term anticoagulation. ? ?Chronic kidney disease stage IIIa: ?Recheck creatinine today with switch from furosemide to HCTZ.  Follow-up BMP should be checked in 1 month as well with this change.  Importance of blood pressure control reinforced. ? ?Follow-up: Return to clinic in 1 month. ? ?Nelva Bush, MD ?06/18/2021 ?9:03 AM ? ?

## 2021-06-18 NOTE — Patient Instructions (Signed)
Medication Instructions:  ? ?Your physician has recommended you make the following change in your medication:  ? ?STOP Furosemide (Lasix) ? ?START Hydrochlorothiazide (HCTZ) 25 mg daily  ? ?*If you need a refill on your cardiac medications before your next appointment, please call your pharmacy* ? ? ?Lab Work: ? ?Today: BMET, CBC ? ?If you have labs (blood work) drawn today and your tests are completely normal, you will receive your results only by: ?MyChart Message (if you have MyChart) OR ?A paper copy in the mail ?If you have any lab test that is abnormal or we need to change your treatment, we will call you to review the results. ? ? ?Testing/Procedures: ? ?None ordered ? ? ?Follow-Up: ?At [facility], you and your health needs are our priority.  As part of our continuing mission to provide you with exceptional heart care, we have created designated Provider Care Teams.  These Care Teams include your primary Cardiologist (physician) and Advanced Practice Providers (APPs -  Physician Assistants and Nurse Practitioners) who all work together to provide you with the care you need, when you need it. ? ?We recommend signing up for the patient portal called "MyChart".  Sign up information is provided on this After Visit Summary.  MyChart is used to connect with patients for Virtual Visits (Telemedicine).  Patients are able to view lab/test results, encounter notes, upcoming appointments, etc.  Non-urgent messages can be sent to your provider as well.   ?To learn more about what you can do with MyChart, go to [email or web address].   ? ?Your next appointment:   ?1 month(s) ? ?The format for your next appointment:   ?In Person ? ?Provider:   ?You may see Yvonne Kendall, MD or one of the following Advanced Practice Providers on your designated Care Team:   ?Nicolasa Ducking, NP ?Eula Listen, PA-C ?Cadence Fransico Michael, PA-C ? ?Important Information About Sugar ? ? ? ? ? ? ?

## 2021-06-19 LAB — BASIC METABOLIC PANEL
BUN/Creatinine Ratio: 13 (ref 10–24)
BUN: 16 mg/dL (ref 8–27)
CO2: 22 mmol/L (ref 20–29)
Calcium: 9.6 mg/dL (ref 8.6–10.2)
Chloride: 105 mmol/L (ref 96–106)
Creatinine, Ser: 1.19 mg/dL (ref 0.76–1.27)
Glucose: 93 mg/dL (ref 70–99)
Potassium: 3.8 mmol/L (ref 3.5–5.2)
Sodium: 143 mmol/L (ref 134–144)
eGFR: 67 mL/min/{1.73_m2} (ref >59)

## 2021-06-19 LAB — CBC
Hematocrit: 36.6 % — ABNORMAL LOW (ref 37.5–51.0)
Hemoglobin: 12.5 g/dL — ABNORMAL LOW (ref 13.0–17.7)
MCH: 29.6 pg (ref 26.6–33.0)
MCHC: 34.2 g/dL (ref 31.5–35.7)
MCV: 87 fL (ref 79–97)
Platelets: 345 10*3/uL (ref 150–450)
RBC: 4.23 x10E6/uL (ref 4.14–5.80)
RDW: 13.4 % (ref 11.6–15.4)
WBC: 3.7 10*3/uL (ref 3.4–10.8)

## 2021-07-25 ENCOUNTER — Encounter: Payer: Self-pay | Admitting: Internal Medicine

## 2021-07-25 ENCOUNTER — Ambulatory Visit (INDEPENDENT_AMBULATORY_CARE_PROVIDER_SITE_OTHER): Payer: Medicare Other | Admitting: Internal Medicine

## 2021-07-25 ENCOUNTER — Other Ambulatory Visit
Admission: RE | Admit: 2021-07-25 | Discharge: 2021-07-25 | Disposition: A | Discharge status: Home / Self Care | Payer: Medicare Other | Attending: Internal Medicine | Admitting: Internal Medicine

## 2021-07-25 VITALS — BP 136/70 | HR 66 | Ht 65.0 in | Wt 168.0 lb

## 2021-07-25 DIAGNOSIS — I10 Essential (primary) hypertension: Secondary | ICD-10-CM | POA: Insufficient documentation

## 2021-07-25 DIAGNOSIS — I4892 Unspecified atrial flutter: Secondary | ICD-10-CM

## 2021-07-25 DIAGNOSIS — Z720 Tobacco use: Secondary | ICD-10-CM

## 2021-07-25 DIAGNOSIS — I5032 Chronic diastolic (congestive) heart failure: Secondary | ICD-10-CM

## 2021-07-25 LAB — BASIC METABOLIC PANEL
Anion gap: 8 (ref 5–15)
BUN: 32 mg/dL — ABNORMAL HIGH (ref 8–23)
CO2: 28 mmol/L (ref 22–32)
Calcium: 9.6 mg/dL (ref 8.9–10.3)
Chloride: 102 mmol/L (ref 98–111)
Creatinine, Ser: 1.45 mg/dL — ABNORMAL HIGH (ref 0.61–1.24)
GFR, Estimated: 53 mL/min — ABNORMAL LOW (ref >60)
Glucose, Bld: 112 mg/dL — ABNORMAL HIGH (ref 70–99)
Potassium: 4.4 mmol/L (ref 3.5–5.1)
Sodium: 138 mmol/L (ref 135–145)

## 2021-07-25 NOTE — Progress Notes (Signed)
Follow-up Outpatient Visit Date: 07/25/2021  Primary Care Provider: Patient, No Pcp Per (Inactive) No address on file  Chief Complaint: Follow-up HFpEF and hypertension  HPI:  Jacob Waters is a 66 y.o. male with history of chronic HFpEF, paroxysmal atrial flutter, hypertension, and polysubstance abuse, who presents for follow-up of HFpEF, atrial flutter, and hypertension.  I last saw him a month ago, at which time he noted persistently elevated blood pressures at home.  He was feeling well and reported compliance with his medications.  He admitted to eating more salt than recommended.  We agreed to stop furosemide and initiate HCTZ 25 mg daily.  Today, Jacob Waters is feeling well without chest pain, shortness of breath, palpitations, lightheadedness, or edema.  He is tolerating his medications well, including recent transition from furosemide to HCTZ.  He has been eating more fruit and is trying to limit his sodium intake.  --------------------------------------------------------------------------------------------------  Past Medical History:  Diagnosis Date   Alcohol abuse    Atrial flutter (HCC)    Chronic heart failure with preserved ejection fraction (HFpEF) (HCC)    Hypertension    Past Surgical History:  Procedure Laterality Date   NO PAST SURGERIES      Current Meds  Medication Sig   amLODipine (NORVASC) 10 MG tablet Take 1 tablet (10 mg total) by mouth daily.   apixaban (ELIQUIS) 5 MG TABS tablet Take 1 tablet (5 mg total) by mouth 2 (two) times daily.   hydrochlorothiazide (HYDRODIURIL) 25 MG tablet Take 1 tablet (25 mg total) by mouth daily.   isosorbide mononitrate (IMDUR) 30 MG 24 hr tablet Take 1 tablet (30 mg total) by mouth daily.   sacubitril-valsartan (ENTRESTO) 97-103 MG Take 1 tablet by mouth 2 (two) times daily.    Allergies: Patient has no known allergies.  Social History   Tobacco Use   Smoking status: Never   Smokeless tobacco: Current    Types: Chew     Last attempt to quit: 05/2018  Vaping Use   Vaping Use: Never used  Substance Use Topics   Alcohol use: Not Currently    Alcohol/week: 24.0 standard drinks    Types: 24 Cans of beer per week   Drug use: Never    Family History  Problem Relation Age of Onset   Cancer Mother    Hypertension Mother    Cancer Father    Hypertension Father    Hypertension Sister     Review of Systems: A 12-system review of systems was performed and was negative except as noted in the HPI.  --------------------------------------------------------------------------------------------------  Physical Exam: BP 136/70 (BP Location: Left Arm, Patient Position: Sitting, Cuff Size: Normal)   Pulse 66   Ht 5\' 5"  (1.651 m)   Wt 168 lb (76.2 kg)   SpO2 97%   BMI 27.96 kg/m   General:  NAD. Neck: No JVD or HJR. Lungs: Clear to auscultation bilaterally without wheezes or crackles. Heart: Regular rate and rhythm without murmurs, rubs, or gallops. Abdomen: Soft, nontender, nondistended. Extremities: No lower extremity edema.  EKG: Normal sinus rhythm without significant abnormality.  Compared to prior tracing from 06/18/2021, nonspecific T wave changes have resolved.  Lab Results  Component Value Date   WBC 3.7 06/18/2021   HGB 12.5 (L) 06/18/2021   HCT 36.6 (L) 06/18/2021   MCV 87 06/18/2021   PLT 345 06/18/2021    Lab Results  Component Value Date   NA 143 06/18/2021   K 3.8 06/18/2021   CL 105  06/18/2021   CO2 22 06/18/2021   BUN 16 06/18/2021   CREATININE 1.19 06/18/2021   GLUCOSE 93 06/18/2021   ALT 33 11/11/2019    --------------------------------------------------------------------------------------------------  ASSESSMENT AND PLAN: Hypertension: Blood pressure improved today and in the upper normal range.  Continue current regimen of amlodipine, Entresto, HCTZ, and isosorbide mononitrate.  We will check a BMP today to ensure stable renal function and electrolytes following  transition from furosemide to HCTZ last month.  Chronic HFpEF: Jacob Waters appears euvolemic with NYHA class I symptoms.  Continue current medications.  Atrial flutter: No evidence of recurrence.  Given CHA2DS2-VASc score of at least 3 (hypertension, heart failure, and age), we will plan to continue anticoagulation with apixaban 5 mg twice daily.  Tobacco abuse: Jacob Waters continues to use chewing tobacco intermittently.  I have encouraged him to quit.  Use of a nicotine replacement therapy such as lozenges or gum may be helpful.  Follow-up: Return to clinic in 6 months.  Jacob Kendall, MD 07/25/2021 8:46 AM

## 2021-07-25 NOTE — Patient Instructions (Signed)
Medication Instructions:   Your physician recommends that you continue on your current medications as directed. Please refer to the Current Medication list given to you today.  *If you need a refill on your cardiac medications before your next appointment, please call your pharmacy*   Lab Work:  BMET today at the medical mall:  -  Please go to the [facility] will check in at the registration desk to the right as you walk into the atrium.   If you have labs (blood work) drawn today and your tests are completely normal, you will receive your results only by: Box (if you have MyChart) OR A paper copy in the mail If you have any lab test that is abnormal or we need to change your treatment, we will call you to review the results.   Testing/Procedures:  None ordered   Follow-Up: At [facility], you and your health needs are our priority.  As part of our continuing mission to provide you with exceptional heart care, we have created designated Provider Care Teams.  These Care Teams include your primary Cardiologist (physician) and Advanced Practice Providers (APPs -  Physician Assistants and Nurse Practitioners) who all work together to provide you with the care you need, when you need it.  We recommend signing up for the patient portal called "MyChart".  Sign up information is provided on this After Visit Summary.  MyChart is used to connect with patients for Virtual Visits (Telemedicine).  Patients are able to view lab/test results, encounter notes, upcoming appointments, etc.  Non-urgent messages can be sent to your provider as well.   To learn more about what you can do with MyChart, go to [email or web address].    Your next appointment:   6 month(s)  The format for your next appointment:   In Person  Provider:   You may see Nelva Bush, MD or one of the following Advanced Practice Providers on your designated Care Team:   Murray Hodgkins,  NP Christell Faith, PA-C Cadence Kathlen Mody, PA-C{   Important Information About Sugar

## 2021-10-01 ENCOUNTER — Telehealth: Payer: Self-pay | Admitting: Internal Medicine

## 2021-10-01 DIAGNOSIS — I4892 Unspecified atrial flutter: Secondary | ICD-10-CM

## 2021-10-01 DIAGNOSIS — Z7901 Long term (current) use of anticoagulants: Secondary | ICD-10-CM

## 2021-10-01 MED ORDER — APIXABAN 5 MG PO TABS: 5.0000 mg | ORAL_TABLET | Freq: Two times a day (BID) | ORAL | 5 refills | Status: DC

## 2021-10-01 NOTE — Telephone Encounter (Signed)
*  STAT* If patient is at the pharmacy, call can be transferred to refill team.   1. Which medications need to be refilled? (please list name of each medication and dose if known)   apixaban (ELIQUIS) 5 MG TABS tablet     2. Which pharmacy/location (including street and city if local pharmacy) is medication to be sent to? [id number] - [place] ([address]), [place] - [address]  3. Do they need a 30 day or 90 day supply?  30 day

## 2021-10-01 NOTE — Telephone Encounter (Signed)
Prescription refill request for Eliquis received. Indication:Aflutter Last office visit:07/25/21 (End) Scr: 1.45 (07/25/21) Age: 66 Weight: 76.2kg  Appropriate dose and refill sent to requested pharmacy.

## 2021-10-01 NOTE — Telephone Encounter (Signed)
Refill request

## 2022-01-20 ENCOUNTER — Telehealth: Payer: Self-pay | Admitting: Internal Medicine

## 2022-01-20 DIAGNOSIS — I4892 Unspecified atrial flutter: Secondary | ICD-10-CM

## 2022-01-20 DIAGNOSIS — Z7901 Long term (current) use of anticoagulants: Secondary | ICD-10-CM

## 2022-01-20 MED ORDER — AMLODIPINE BESYLATE 10 MG PO TABS: 10.0000 mg | ORAL_TABLET | Freq: Every day | ORAL | 0 refills | Status: DC

## 2022-01-20 MED ORDER — ENTRESTO 97-103 MG PO TABS: 1.0000 | ORAL_TABLET | Freq: Two times a day (BID) | ORAL | 0 refills | Status: DC

## 2022-01-20 MED ORDER — APIXABAN 5 MG PO TABS: 5.0000 mg | ORAL_TABLET | Freq: Two times a day (BID) | ORAL | 0 refills | Status: DC

## 2022-01-20 MED ORDER — ISOSORBIDE MONONITRATE ER 30 MG PO TB24: 30.0000 mg | ORAL_TABLET | Freq: Every day | ORAL | 0 refills | Status: DC

## 2022-01-20 MED ORDER — HYDROCHLOROTHIAZIDE 25 MG PO TABS: 25.0000 mg | ORAL_TABLET | Freq: Every day | ORAL | 0 refills | Status: DC

## 2022-01-20 NOTE — Telephone Encounter (Signed)
*  STAT* If patient is at the pharmacy, call can be transferred to refill team.   1. Which medications need to be refilled? (please list name of each medication and dose if known)  amLODipine (NORVASC) 10 MG tablet (Expired)  apixaban (ELIQUIS) 5 MG TABS tablet  hydrochlorothiazide (HYDRODIURIL) 25 MG tablet (Expired)  isosorbide mononitrate (IMDUR) 30 MG 24 hr tablet (Expired)  sacubitril-valsartan (ENTRESTO) 97-103 MG   2. Which pharmacy/location (including street and city if local pharmacy) is medication to be sent to?  [facility] - [place] ([address]), [place] - [address]   3. Do they need a 30 day or 90 day supply? 60 day  Patient stated he only has 3 tablets left. Patient has appointment scheduled on 11/29.

## 2022-02-02 NOTE — Progress Notes (Unsigned)
Follow-up Outpatient Visit Date: 02/04/2022  Primary Care Provider: Patient, No Pcp Per No address on file  Chief Complaint: Follow-up HFpEF and atrial flutter  HPI:  Jacob Waters is a 66 y.o. male with history of chronic HFpEF, paroxysmal atrial flutter, hypertension, and polysubstance abuse, who presents for follow-up of HFpEF, atrial flutter, and hypertension.  I last saw him in May, at which time he was feeling well.  We did not make any medication changes or pursue further testing.  Today, Jacob Waters reports that he has been feeling fairly well though he has noticed occasional orthopnea over the last few months.  Typically, it happens every 3 to 4 days and resolves after a few minutes.  He notes that his weight is up 12 pounds since May, which he attributes to eating a lot, especially fast food.  He has not had any shortness of breath when sitting/standing or with activities.  He also denies chest pain, palpitations, lightheadedness, and edema.  Home blood pressures are typically in the 130's/50's.  --------------------------------------------------------------------------------------------------  Past Medical History:  Diagnosis Date   Alcohol abuse    Atrial flutter (HCC)    Chronic heart failure with preserved ejection fraction (HFpEF) (HCC)    Hypertension    Past Surgical History:  Procedure Laterality Date   NO PAST SURGERIES      Current Meds  Medication Sig   amLODipine (NORVASC) 10 MG tablet Take 1 tablet (10 mg total) by mouth daily.   apixaban (ELIQUIS) 5 MG TABS tablet Take 1 tablet (5 mg total) by mouth 2 (two) times daily.   hydrochlorothiazide (HYDRODIURIL) 25 MG tablet Take 1 tablet (25 mg total) by mouth daily.   isosorbide mononitrate (IMDUR) 30 MG 24 hr tablet Take 1 tablet (30 mg total) by mouth daily.   sacubitril-valsartan (ENTRESTO) 97-103 MG Take 1 tablet by mouth 2 (two) times daily.    Allergies: Patient has no known allergies.  Social History    Tobacco Use   Smoking status: Never   Smokeless tobacco: Current    Types: Chew    Last attempt to quit: 05/2018  Vaping Use   Vaping Use: Never used  Substance Use Topics   Alcohol use: Not Currently    Alcohol/week: 24.0 standard drinks of alcohol    Types: 24 Cans of beer per week   Drug use: Never    Family History  Problem Relation Age of Onset   Cancer Mother    Hypertension Mother    Cancer Father    Hypertension Father    Hypertension Sister     Review of Systems: A 12-system review of systems was performed and was negative except as noted in the HPI.  --------------------------------------------------------------------------------------------------  Physical Exam: BP 130/64 (BP Location: Left Arm, Patient Position: Sitting, Cuff Size: Large)   Pulse 85   Ht 5\' 5"  (1.651 m)   Wt 180 lb (81.6 kg)   SpO2 98%   BMI 29.95 kg/m   General:  NAD. Neck: No JVD or HJR. Lungs: Clear to auscultation bilaterally without wheezes or crackles. Heart: Regular rate and rhythm without murmurs, rubs, or gallops. Abdomen: Soft, nontender, nondistended. Extremities: No lower extremity edema.  EKG:  Normal sinus rhythm without abnormalities.  Lab Results  Component Value Date   WBC 3.7 06/18/2021   HGB 12.5 (L) 06/18/2021   HCT 36.6 (L) 06/18/2021   MCV 87 06/18/2021   PLT 345 06/18/2021    Lab Results  Component Value Date   NA  138 07/25/2021   K 4.4 07/25/2021   CL 102 07/25/2021   CO2 28 07/25/2021   BUN 32 (H) 07/25/2021   CREATININE 1.45 (H) 07/25/2021   GLUCOSE 112 (H) 07/25/2021   ALT 33 11/11/2019    No results found for: "CHOL", "HDL", "LDLCALC", "LDLDIRECT", "TRIG", "CHOLHDL"  --------------------------------------------------------------------------------------------------  ASSESSMENT AND PLAN: Chronic HFpEF and orthopnea: Jacob Waters reports sporadic orthopnea as well as a 12 pound weight gain since May but otherwise feels well.  He appears  euvolemic on exam today.  We discussed the importance of sodium restriction.  I will obtain labs today, including a CBC to ensure that he is not developing symptomatic anemia as well as a CMP and BNP.  If his BNP is elevated, we will have him start furosemide.  Will also repeat an echocardiogram to ensure that he has not had a recurrent drop in his LVEF.  Though his symptoms are not suggestive of angina, we will need to consider ischemia evaluation if there is evidence of declining LVEF.  Continue current regimen including Entresto.  We could consider adding an SGLT2 inhibitor in the future.  Paroxysmal atrial flutter: Jacob Waters is maintaining sinus rhythm.  I will check a CBC to ensure that he is not developing worsening anemia underlying his orthopnea.  As long as hemoglobin remained stable, we will continue indefinite anticoagulation with apixaban.  Hypertension: Blood pressure upper normal today, similar to home readings.  Continue current regimen of amlodipine, HCTZ, isosorbide mononitrate, and Entresto.  Tobacco use: Jacob Waters continues to use chewing tobacco some but is trying to cut down further.  I encouraged him to quit.  Follow-up: Return to clinic in 3 months.  Nelva Bush, MD 02/04/2022 9:09 AM

## 2022-02-04 ENCOUNTER — Encounter: Payer: Self-pay | Admitting: Internal Medicine

## 2022-02-04 ENCOUNTER — Other Ambulatory Visit
Admission: RE | Admit: 2022-02-04 | Discharge: 2022-02-04 | Disposition: A | Discharge status: Home / Self Care | Payer: Medicare Other | Source: Ambulatory Visit | Attending: Internal Medicine | Admitting: Internal Medicine

## 2022-02-04 ENCOUNTER — Ambulatory Visit: Payer: Medicare Other | Attending: Internal Medicine | Admitting: Internal Medicine

## 2022-02-04 VITALS — BP 130/64 | HR 85 | Ht 65.0 in | Wt 180.0 lb

## 2022-02-04 DIAGNOSIS — Z79899 Other long term (current) drug therapy: Secondary | ICD-10-CM | POA: Insufficient documentation

## 2022-02-04 DIAGNOSIS — I4892 Unspecified atrial flutter: Secondary | ICD-10-CM | POA: Diagnosis present

## 2022-02-04 DIAGNOSIS — I10 Essential (primary) hypertension: Secondary | ICD-10-CM | POA: Diagnosis present

## 2022-02-04 DIAGNOSIS — R0601 Orthopnea: Secondary | ICD-10-CM | POA: Diagnosis present

## 2022-02-04 DIAGNOSIS — I5030 Unspecified diastolic (congestive) heart failure: Secondary | ICD-10-CM | POA: Diagnosis present

## 2022-02-04 DIAGNOSIS — Z720 Tobacco use: Secondary | ICD-10-CM | POA: Insufficient documentation

## 2022-02-04 LAB — COMPREHENSIVE METABOLIC PANEL
ALT: 22 U/L (ref 0–44)
AST: 27 U/L (ref 15–41)
Albumin: 4.6 g/dL (ref 3.5–5.0)
Alkaline Phosphatase: 47 U/L (ref 38–126)
Anion gap: 10 (ref 5–15)
BUN: 23 mg/dL (ref 8–23)
CO2: 25 mmol/L (ref 22–32)
Calcium: 9.3 mg/dL (ref 8.9–10.3)
Chloride: 102 mmol/L (ref 98–111)
Creatinine, Ser: 1.24 mg/dL (ref 0.61–1.24)
GFR, Estimated: >60 mL/min (ref >60)
Glucose, Bld: 118 mg/dL — ABNORMAL HIGH (ref 70–99)
Potassium: 5.2 mmol/L — ABNORMAL HIGH (ref 3.5–5.1)
Sodium: 137 mmol/L (ref 135–145)
Total Bilirubin: 0.6 mg/dL (ref 0.3–1.2)
Total Protein: 8.1 g/dL (ref 6.5–8.1)

## 2022-02-04 LAB — CBC
HCT: 38.1 % — ABNORMAL LOW (ref 39.0–52.0)
Hemoglobin: 13.1 g/dL (ref 13.0–17.0)
MCH: 29.4 pg (ref 26.0–34.0)
MCHC: 34.4 g/dL (ref 30.0–36.0)
MCV: 85.4 fL (ref 80.0–100.0)
Platelets: 378 10*3/uL (ref 150–400)
RBC: 4.46 MIL/uL (ref 4.22–5.81)
RDW: 12.3 % (ref 11.5–15.5)
WBC: 3.9 10*3/uL — ABNORMAL LOW (ref 4.0–10.5)
nRBC: 0 % (ref 0.0–0.2)

## 2022-02-04 LAB — BRAIN NATRIURETIC PEPTIDE: B Natriuretic Peptide: 29.2 pg/mL (ref 0.0–100.0)

## 2022-02-04 MED ORDER — HYDROCHLOROTHIAZIDE 25 MG PO TABS: 25.0000 mg | ORAL_TABLET | Freq: Every day | ORAL | 3 refills | Status: DC

## 2022-02-04 MED ORDER — AMLODIPINE BESYLATE 10 MG PO TABS: 10.0000 mg | ORAL_TABLET | Freq: Every day | ORAL | 3 refills | Status: DC

## 2022-02-04 MED ORDER — ENTRESTO 97-103 MG PO TABS: 1.0000 | ORAL_TABLET | Freq: Two times a day (BID) | ORAL | 3 refills | Status: DC

## 2022-02-04 MED ORDER — ISOSORBIDE MONONITRATE ER 30 MG PO TB24: 30.0000 mg | ORAL_TABLET | Freq: Every day | ORAL | 3 refills | Status: DC

## 2022-02-04 NOTE — Patient Instructions (Signed)
Medication Instructions:  Your Physician recommend you continue on your current medication as directed.    *If you need a refill on your cardiac medications before your next appointment, please call your pharmacy*   Lab Work: Your provider would like for you to have the following labs drawn today: (CBC, CMP, BNP).   Please go to the [facility] entrance and check in at the front desk.  You do not need an appointment.  They are open from 7am-6 pm.  You do not need to be fasting.  If you have labs (blood work) drawn today and your tests are completely normal, you will receive your results only by: MyChart Message (if you have MyChart) OR A paper copy in the mail If you have any lab test that is abnormal or we need to change your treatment, we will call you to review the results.   Testing/Procedures: Your physician has requested that you have an echocardiogram. Echocardiography is a painless test that uses sound waves to create images of your heart. It provides your doctor with information about the size and shape of your heart and how well your heart's chambers and valves are working.   You may receive an ultrasound enhancing agent through an IV if needed to better visualize your heart during the echo. This procedure takes approximately one hour.  There are no restrictions for this procedure.  This will take place at [address] [address], [place] [address]    Follow-Up: At [facility], you and your health needs are our priority.  As part of our continuing mission to provide you with exceptional heart care, we have created designated Provider Care Teams.  These Care Teams include your primary Cardiologist (physician) and Advanced Practice Providers (APPs -  Physician Assistants and Nurse Practitioners) who all work together to provide you with the care you need, when you need it.  We recommend signing up for the patient portal called  "MyChart".  Sign up information is provided on this After Visit Summary.  MyChart is used to connect with patients for Virtual Visits (Telemedicine).  Patients are able to view lab/test results, encounter notes, upcoming appointments, etc.  Non-urgent messages can be sent to your provider as well.   To learn more about what you can do with MyChart, go to [email or web address].    Your next appointment:   3 month(s)  The format for your next appointment:   In Person  Provider:   You may see Yvonne Kendall, MD or one of the following Advanced Practice Providers on your designated Care Team:   Nicolasa Ducking, NP Eula Listen, PA-C Cadence Fransico Michael, PA-C Charlsie Quest, NP

## 2022-03-11 ENCOUNTER — Emergency Department: Payer: Medicare Other

## 2022-03-11 ENCOUNTER — Other Ambulatory Visit: Payer: Self-pay

## 2022-03-11 ENCOUNTER — Inpatient Hospital Stay: Payer: Medicare Other

## 2022-03-11 ENCOUNTER — Encounter: Payer: Self-pay | Admitting: Emergency Medicine

## 2022-03-11 ENCOUNTER — Inpatient Hospital Stay
Admission: EM | Admit: 2022-03-11 | Discharge: 2022-03-13 | DRG: 394 | Disposition: A | Discharge status: Home / Self Care | Payer: Medicare Other | Attending: Obstetrics and Gynecology | Admitting: Obstetrics and Gynecology

## 2022-03-11 DIAGNOSIS — Z8249 Family history of ischemic heart disease and other diseases of the circulatory system: Secondary | ICD-10-CM | POA: Diagnosis not present

## 2022-03-11 DIAGNOSIS — I5032 Chronic diastolic (congestive) heart failure: Secondary | ICD-10-CM | POA: Diagnosis present

## 2022-03-11 DIAGNOSIS — Z6829 Body mass index (BMI) 29.0-29.9, adult: Secondary | ICD-10-CM | POA: Diagnosis not present

## 2022-03-11 DIAGNOSIS — K920 Hematemesis: Secondary | ICD-10-CM | POA: Diagnosis present

## 2022-03-11 DIAGNOSIS — E663 Overweight: Secondary | ICD-10-CM | POA: Diagnosis present

## 2022-03-11 DIAGNOSIS — I4892 Unspecified atrial flutter: Secondary | ICD-10-CM | POA: Diagnosis present

## 2022-03-11 DIAGNOSIS — I700 Atherosclerosis of aorta: Secondary | ICD-10-CM | POA: Diagnosis present

## 2022-03-11 DIAGNOSIS — K219 Gastro-esophageal reflux disease without esophagitis: Secondary | ICD-10-CM | POA: Diagnosis present

## 2022-03-11 DIAGNOSIS — K56609 Unspecified intestinal obstruction, unspecified as to partial versus complete obstruction: Secondary | ICD-10-CM

## 2022-03-11 DIAGNOSIS — Z720 Tobacco use: Secondary | ICD-10-CM

## 2022-03-11 DIAGNOSIS — N179 Acute kidney failure, unspecified: Secondary | ICD-10-CM | POA: Diagnosis not present

## 2022-03-11 DIAGNOSIS — E860 Dehydration: Secondary | ICD-10-CM | POA: Diagnosis present

## 2022-03-11 DIAGNOSIS — I480 Paroxysmal atrial fibrillation: Secondary | ICD-10-CM | POA: Diagnosis present

## 2022-03-11 DIAGNOSIS — F1011 Alcohol abuse, in remission: Secondary | ICD-10-CM | POA: Diagnosis present

## 2022-03-11 DIAGNOSIS — I130 Hypertensive heart and chronic kidney disease with heart failure and stage 1 through stage 4 chronic kidney disease, or unspecified chronic kidney disease: Secondary | ICD-10-CM | POA: Diagnosis present

## 2022-03-11 DIAGNOSIS — K4090 Unilateral inguinal hernia, without obstruction or gangrene, not specified as recurrent: Secondary | ICD-10-CM

## 2022-03-11 DIAGNOSIS — F1910 Other psychoactive substance abuse, uncomplicated: Secondary | ICD-10-CM | POA: Diagnosis present

## 2022-03-11 DIAGNOSIS — K4000 Bilateral inguinal hernia, with obstruction, without gangrene, not specified as recurrent: Principal | ICD-10-CM | POA: Diagnosis present

## 2022-03-11 DIAGNOSIS — I10 Essential (primary) hypertension: Secondary | ICD-10-CM | POA: Diagnosis present

## 2022-03-11 DIAGNOSIS — Z1152 Encounter for screening for COVID-19: Secondary | ICD-10-CM | POA: Diagnosis not present

## 2022-03-11 DIAGNOSIS — N1831 Chronic kidney disease, stage 3a: Secondary | ICD-10-CM | POA: Diagnosis present

## 2022-03-11 DIAGNOSIS — Z79899 Other long term (current) drug therapy: Secondary | ICD-10-CM | POA: Diagnosis not present

## 2022-03-11 DIAGNOSIS — Z7901 Long term (current) use of anticoagulants: Secondary | ICD-10-CM | POA: Diagnosis not present

## 2022-03-11 DIAGNOSIS — I5030 Unspecified diastolic (congestive) heart failure: Secondary | ICD-10-CM | POA: Diagnosis present

## 2022-03-11 DIAGNOSIS — F1010 Alcohol abuse, uncomplicated: Secondary | ICD-10-CM | POA: Diagnosis present

## 2022-03-11 LAB — COMPREHENSIVE METABOLIC PANEL
ALT: 19 U/L (ref 0–44)
AST: 26 U/L (ref 15–41)
Albumin: 5 g/dL (ref 3.5–5.0)
Alkaline Phosphatase: 50 U/L (ref 38–126)
Anion gap: 14 (ref 5–15)
BUN: 25 mg/dL — ABNORMAL HIGH (ref 8–23)
CO2: 31 mmol/L (ref 22–32)
Calcium: 9.9 mg/dL (ref 8.9–10.3)
Chloride: 91 mmol/L — ABNORMAL LOW (ref 98–111)
Creatinine, Ser: 1.5 mg/dL — ABNORMAL HIGH (ref 0.61–1.24)
GFR, Estimated: 51 mL/min — ABNORMAL LOW (ref >60)
Glucose, Bld: 180 mg/dL — ABNORMAL HIGH (ref 70–99)
Potassium: 3.8 mmol/L (ref 3.5–5.1)
Sodium: 136 mmol/L (ref 135–145)
Total Bilirubin: 0.7 mg/dL (ref 0.3–1.2)
Total Protein: 8.7 g/dL — ABNORMAL HIGH (ref 6.5–8.1)

## 2022-03-11 LAB — CBC
HCT: 34.9 % — ABNORMAL LOW (ref 39.0–52.0)
Hemoglobin: 11.9 g/dL — ABNORMAL LOW (ref 13.0–17.0)
MCH: 30.4 pg (ref 26.0–34.0)
MCHC: 34.1 g/dL (ref 30.0–36.0)
MCV: 89 fL (ref 80.0–100.0)
Platelets: 351 10*3/uL (ref 150–400)
RBC: 3.92 MIL/uL — ABNORMAL LOW (ref 4.22–5.81)
RDW: 12.8 % (ref 11.5–15.5)
WBC: 8.9 10*3/uL (ref 4.0–10.5)
nRBC: 0 % (ref 0.0–0.2)

## 2022-03-11 LAB — CBC WITH DIFFERENTIAL/PLATELET
Abs Immature Granulocytes: 0.03 10*3/uL (ref 0.00–0.07)
Basophils Absolute: 0 10*3/uL (ref 0.0–0.1)
Basophils Relative: 0 %
Eosinophils Absolute: 0 10*3/uL (ref 0.0–0.5)
Eosinophils Relative: 0 %
HCT: 38.2 % — ABNORMAL LOW (ref 39.0–52.0)
Hemoglobin: 13.1 g/dL (ref 13.0–17.0)
Immature Granulocytes: 0 %
Lymphocytes Relative: 9 %
Lymphs Abs: 0.8 10*3/uL (ref 0.7–4.0)
MCH: 30 pg (ref 26.0–34.0)
MCHC: 34.3 g/dL (ref 30.0–36.0)
MCV: 87.4 fL (ref 80.0–100.0)
Monocytes Absolute: 0.5 10*3/uL (ref 0.1–1.0)
Monocytes Relative: 6 %
Neutro Abs: 7.7 10*3/uL (ref 1.7–7.7)
Neutrophils Relative %: 85 %
Platelets: 402 10*3/uL — ABNORMAL HIGH (ref 150–400)
RBC: 4.37 MIL/uL (ref 4.22–5.81)
RDW: 12.4 % (ref 11.5–15.5)
WBC: 9.1 10*3/uL (ref 4.0–10.5)
nRBC: 0 % (ref 0.0–0.2)

## 2022-03-11 LAB — MAGNESIUM: Magnesium: 2.3 mg/dL (ref 1.7–2.4)

## 2022-03-11 LAB — RESP PANEL BY RT-PCR (RSV, FLU A&B, COVID)  RVPGX2
Influenza A by PCR: NEGATIVE
Influenza B by PCR: NEGATIVE
Resp Syncytial Virus by PCR: NEGATIVE
SARS Coronavirus 2 by RT PCR: NEGATIVE

## 2022-03-11 LAB — TROPONIN I (HIGH SENSITIVITY)
Troponin I (High Sensitivity): 3 ng/L (ref <18)
Troponin I (High Sensitivity): 4 ng/L (ref <18)

## 2022-03-11 LAB — TYPE AND SCREEN
ABO/RH(D): A POS
Antibody Screen: NEGATIVE

## 2022-03-11 LAB — PROTIME-INR
INR: 1.2 (ref 0.8–1.2)
Prothrombin Time: 15.5 seconds — ABNORMAL HIGH (ref 11.4–15.2)

## 2022-03-11 LAB — ETHANOL: Alcohol, Ethyl (B): <10 mg/dL (ref <10)

## 2022-03-11 LAB — LIPASE, BLOOD: Lipase: 41 U/L (ref 11–51)

## 2022-03-11 MED ORDER — DIATRIZOATE MEGLUMINE & SODIUM 66-10 % PO SOLN
90.0000 mL | Freq: Once | ORAL | Status: AC
  Administered 2022-03-11: 90 mL via NASOGASTRIC

## 2022-03-11 MED ORDER — ONDANSETRON HCL 4 MG/2ML IJ SOLN
4.0000 mg | Freq: Once | INTRAMUSCULAR | Status: AC
  Administered 2022-03-11: 4 mg via INTRAVENOUS
  Filled 2022-03-11: qty 2

## 2022-03-11 MED ORDER — SODIUM CHLORIDE 0.9 % IV SOLN: INTRAVENOUS | Status: DC

## 2022-03-11 MED ORDER — DROPERIDOL 2.5 MG/ML IJ SOLN
2.5000 mg | Freq: Once | INTRAMUSCULAR | Status: AC
  Administered 2022-03-11: 2.5 mg via INTRAVENOUS
  Filled 2022-03-11: qty 2

## 2022-03-11 MED ORDER — ONDANSETRON 4 MG PO TBDP
4.0000 mg | ORAL_TABLET | Freq: Once | ORAL | Status: AC
  Administered 2022-03-11: 4 mg via ORAL
  Filled 2022-03-11: qty 1

## 2022-03-11 MED ORDER — AMLODIPINE BESYLATE 10 MG PO TABS
10.0000 mg | ORAL_TABLET | Freq: Every day | ORAL | Status: DC
  Administered 2022-03-12 – 2022-03-13 (×2): 10 mg
  Filled 2022-03-11: qty 2
  Filled 2022-03-11: qty 1

## 2022-03-11 MED ORDER — PANTOPRAZOLE SODIUM 40 MG IV SOLR
40.0000 mg | Freq: Two times a day (BID) | INTRAVENOUS | Status: DC
  Administered 2022-03-11 – 2022-03-13 (×5): 40 mg via INTRAVENOUS
  Filled 2022-03-11 (×5): qty 10

## 2022-03-11 MED ORDER — HYDROMORPHONE HCL 1 MG/ML IJ SOLN: 0.5000 mg | INTRAMUSCULAR | Status: DC | PRN

## 2022-03-11 MED ORDER — PANTOPRAZOLE SODIUM 40 MG IV SOLR: 40.0000 mg | INTRAVENOUS | Status: DC

## 2022-03-11 MED ORDER — FENTANYL CITRATE PF 50 MCG/ML IJ SOSY
50.0000 ug | PREFILLED_SYRINGE | Freq: Once | INTRAMUSCULAR | Status: AC
  Administered 2022-03-11: 50 ug via INTRAVENOUS
  Filled 2022-03-11 (×2): qty 1

## 2022-03-11 MED ORDER — TRAZODONE HCL 50 MG PO TABS: 25.0000 mg | ORAL_TABLET | Freq: Every evening | ORAL | Status: DC | PRN

## 2022-03-11 MED ORDER — ISOSORBIDE MONONITRATE ER 30 MG PO TB24
30.0000 mg | ORAL_TABLET | Freq: Every day | ORAL | Status: DC
  Administered 2022-03-12 – 2022-03-13 (×2): 30 mg via ORAL
  Filled 2022-03-11 (×2): qty 1

## 2022-03-11 MED ORDER — SODIUM CHLORIDE 0.9 % IV BOLUS
1000.0000 mL | Freq: Once | INTRAVENOUS | Status: AC
  Administered 2022-03-11: 1000 mL via INTRAVENOUS

## 2022-03-11 MED ORDER — POTASSIUM CHLORIDE 10 MEQ/100ML IV SOLN
10.0000 meq | INTRAVENOUS | Status: AC
  Administered 2022-03-11 (×3): 10 meq via INTRAVENOUS
  Filled 2022-03-11: qty 100

## 2022-03-11 MED ORDER — ONDANSETRON HCL 4 MG/2ML IJ SOLN
4.0000 mg | Freq: Four times a day (QID) | INTRAMUSCULAR | Status: DC | PRN
  Administered 2022-03-11 (×2): 4 mg via INTRAVENOUS
  Filled 2022-03-11 (×3): qty 2

## 2022-03-11 MED ORDER — ACETAMINOPHEN 650 MG RE SUPP: 650.0000 mg | Freq: Four times a day (QID) | RECTAL | Status: DC | PRN

## 2022-03-11 MED ORDER — ONDANSETRON HCL 4 MG PO TABS: 4.0000 mg | ORAL_TABLET | Freq: Four times a day (QID) | ORAL | Status: DC | PRN

## 2022-03-11 MED ORDER — IOHEXOL 300 MG/ML  SOLN
80.0000 mL | Freq: Once | INTRAMUSCULAR | Status: AC | PRN
  Administered 2022-03-11: 80 mL via INTRAVENOUS

## 2022-03-11 MED ORDER — ENOXAPARIN SODIUM 40 MG/0.4ML IJ SOSY
40.0000 mg | PREFILLED_SYRINGE | INTRAMUSCULAR | Status: DC
  Administered 2022-03-11: 40 mg via SUBCUTANEOUS
  Filled 2022-03-11 (×2): qty 0.4

## 2022-03-11 MED ORDER — ACETAMINOPHEN 325 MG PO TABS: 650.0000 mg | ORAL_TABLET | Freq: Four times a day (QID) | ORAL | Status: DC | PRN

## 2022-03-11 NOTE — H&P (Signed)
History and Physical    Patient: Ruairi Stutsman ERD:[id number] DOB: [birth year] DOA: 03/11/2022 DOS: the patient was seen and examined on 03/11/2022 PCP: Patient, No Pcp Per  Patient coming from: Home  Chief Complaint:  Chief Complaint  Patient presents with   Abdominal Pain   HPI: Narvel Kozub is a 67 y.o. male with medical history significant of alcohol abuse in remission, history of alcohol withdrawal, history of smokeless tobacco use, paroxysmal atrial flutter, SVT, stage 3a CKD, chronic diastolic heart failure, hypertension, overweight with a BMI of 29.95 kg/m who is coming to the emergency department with abdominal pain since yesterday evening after he went to eat to a restaurant with some of his coworkers,, then later in the evening having abdominal pain associated with about 10 episodes of emesis according to the patient.  Emesis described as coffee-ground.  No diarrhea or constipation.  No melena or hematochezia no previous history of SBO.  No previous history of surgery.He denied fever, chills, rhinorrhea, sore throat, wheezing or hemoptysis.  No chest pain, palpitations, diaphoresis, PND, orthopnea or pitting edema of the lower extremities. No flank pain, dysuria, frequency or hematuria.  No polyuria, polydipsia, polyphagia or blurred vision.   ED course: Initial vital signs were temperature 98.1 F, pulse 105, respiration 20, BP 145/68 mmHg O2 sat 96% on room air.  Patient received 1000 mL of normal saline bolus, on the [place] 4 mg disintegrating tablet, and then ondansetron 4 mg IVP x 2, fentanyl 50 mcg IVP and droperidol 2.5 mg IVP x 1.  Lab work: CBC white count 9.1, hemoglobin 13.1 g/dL platelets 402.  PT 15.5 and INR 1.2.  Troponin x 2 normal.  Lipase was normal.  CMP showed a chloride of 91 mmol/L, the rest of the electrolytes were normal.  Glucose 180, BUN 25 and creatinine 1.50 mg/dL.  LFTs show a total protein of 8.7 g/dL, the rest of the hepatic functions were normal.  He was  typed and screened.  Imaging: Portable 1 view chest radiograph with no active disease.  CT abdomen/pelvis with contrast showing dilated, fluid-filled small bowel throughout most of the abdomen, consistent with SBO.  No clear transition point.  Massively distended and fluid-filled stomach with reflux of fluid into the distal esophagus, consistent with gastric outlet obstruction.  Large bilateral inguinal hernias, right greater than left.  The right inguinal hernia containing cysts he can and terminal ileum.  Aortic atherosclerosis.   Review of Systems: As mentioned in the history of present illness. All other systems reviewed and are negative. Past Medical History:  Diagnosis Date   Alcohol abuse    Atrial flutter ([place])    Chronic heart failure with preserved ejection fraction (HFpEF) ([place])    Hypertension    Past Surgical History:  Procedure Laterality Date   NO PAST SURGERIES     Social History:  reports that he has never smoked. His smokeless tobacco use includes chew. He reports that he does not currently use alcohol after a past usage of about 24.0 standard drinks of alcohol per week. He reports that he does not use drugs.  No Known Allergies  Family History  Problem Relation Age of Onset   Cancer Mother    Hypertension Mother    Cancer Father    Hypertension Father    Hypertension Sister     Prior to Admission medications   Medication Sig Start Date End Date Taking? Authorizing Provider  amLODipine (NORVASC) 10 MG tablet Take 1 tablet (10 mg total) by  mouth daily. 02/04/22   End, Harrell Gave, MD  apixaban (ELIQUIS) 5 MG TABS tablet Take 1 tablet (5 mg total) by mouth 2 (two) times daily. 01/20/22   End, Harrell Gave, MD  hydrochlorothiazide (HYDRODIURIL) 25 MG tablet Take 1 tablet (25 mg total) by mouth daily. 02/04/22   End, Harrell Gave, MD  isosorbide mononitrate (IMDUR) 30 MG 24 hr tablet Take 1 tablet (30 mg total) by mouth daily. 02/04/22   End, Harrell Gave, MD   sacubitril-valsartan (ENTRESTO) 97-103 MG Take 1 tablet by mouth 2 (two) times daily. 02/04/22   Nelva Bush, MD    Physical Exam: Vitals:   03/11/22 0044 03/11/22 0045 03/11/22 0229  BP:  (!) 145/68 134/83  Pulse:  (!) 105 95  Resp:  20 20  Temp:  98.1 F (36.7 C) 98.7 F (37.1 C)  TempSrc:  Oral Oral  SpO2:  96% 95%  Weight: 81.6 kg    Height: 5\' 5"  (1.651 m)     Physical Exam Vitals and nursing note reviewed.  Constitutional:      General: He is awake. He is not in acute distress.    Appearance: He is well-developed and overweight.  HENT:     Head: Normocephalic.     Nose: No rhinorrhea.     Comments: NGT in left nostril.    Mouth/Throat:     Mouth: Mucous membranes are dry.  Eyes:     General: No scleral icterus.    Pupils: Pupils are equal, round, and reactive to light.  Neck:     Vascular: No JVD.  Cardiovascular:     Rate and Rhythm: Normal rate and regular rhythm.     Heart sounds: S1 normal and S2 normal.  Pulmonary:     Effort: Pulmonary effort is normal.     Breath sounds: Normal breath sounds.  Abdominal:     General: Abdomen is protuberant. Bowel sounds are normal. There is distension.     Palpations: Abdomen is soft.     Tenderness: There is no abdominal tenderness. There is no right CVA tenderness, left CVA tenderness, guarding or rebound.     Hernia: A hernia is present. Hernia is present in the left inguinal area and right inguinal area.  Musculoskeletal:     Cervical back: Neck supple.     Right lower leg: No edema.     Left lower leg: No edema.  Skin:    General: Skin is warm and dry.  Neurological:     General: No focal deficit present.     Mental Status: He is alert and oriented to person, place, and time.  Psychiatric:        Behavior: Behavior is cooperative.   Data Reviewed:  Results are pending, will review when available.  06/2020 echocardiogram IMPRESSIONS    1. Left ventricular ejection fraction, by estimation, is 60 to  65%. The  left ventricle has normal function. The left ventricle has no regional  wall motion abnormalities. There is mild left ventricular hypertrophy.  Left ventricular diastolic parameters  were normal.   2. Right ventricular systolic function is normal. The right ventricular  size is normal. There is normal pulmonary artery systolic pressure.   3. Left atrial size was mildly dilated.   4. The mitral valve is normal in structure. No evidence of mitral valve  regurgitation.   5. The aortic valve is tricuspid. Aortic valve regurgitation is not  visualized. Mild aortic valve sclerosis is present, with no evidence of  aortic valve stenosis.  6. The inferior vena cava is normal in size with greater than 50%  respiratory variability, suggesting right atrial pressure of 3 mmHg.  Assessment and Plan: Principal Problem:   SBO (small bowel obstruction) (HCC) Associated with:    Coffee ground emesis May have a food content. Inpatient/MedSurg. Keep NPO. Continue IV fluids. Analgesics as needed. Antiemetics as needed. Pantoprazole 40 mg IVP every 12 hours. Monitor hematocrit and hemoglobin. Transfuse as needed. Keep electrolytes optimized. Follow-up CBC and CMP in AM. Follow-up imaging in the morning. General surgery input appreciated. Small bowel follow-through ordered by GS.  Active Problems:   Paroxysmal atrial flutter (HCC) CHA2DS2-VASc Score of at least 3. (HF, HTN and aortic atherosclerosis) Apixaban is on hold. He is not on any medications for rate control.    Heart failure with preserved ejection fraction (HCC) Currently NPO. Resume Entresto and HCTZ once cleared for oral intake.    Essential hypertension Hold amlodipine. Hold HCTZ. Monitor BP and heart rate.    Stage 3a chronic kidney disease (HCC) Monitor renal function electrolytes.    Aortic atherosclerosis (HCC) Consider initiating statin once cleared for oral intake. Otherwise follow-up with primary care  provider.    Overweight Current BMI 29.95 kg/m. Lifestyle modifications. Follow-up with primary.      Advance Care Planning:   Code Status: Full Code   Consults: General surgery (Isami Sakai,MD)  Family Communication:   Severity of Illness: The appropriate patient status for this patient is INPATIENT. Inpatient status is judged to be reasonable and necessary in order to provide the required intensity of service to ensure the patient's safety. The patient's presenting symptoms, physical exam findings, and initial radiographic and laboratory data in the context of their chronic comorbidities is felt to place them at high risk for further clinical deterioration. Furthermore, it is not anticipated that the patient will be medically stable for discharge from the hospital within 2 midnights of admission.   * I certify that at the point of admission it is my clinical judgment that the patient will require inpatient hospital care spanning beyond 2 midnights from the point of admission due to high intensity of service, high risk for further deterioration and high frequency of surveillance required.*  Author: Bobette Mo, MD 03/11/2022 7:18 AM  For on call review [email or web address].   This document was prepared using Dragon voice recognition software and may contain some unintended transcription errors.

## 2022-03-11 NOTE — ED Notes (Signed)
Called xray to inform pt drank entire dose of IV contrast. No answer. NG tube to be unclamped at 0945.

## 2022-03-11 NOTE — Progress Notes (Signed)
Pt NG tube fell out new NG tube placed. MD made aware. Xray order placed by MD for placement verification. Will continue to monitor

## 2022-03-11 NOTE — ED Notes (Signed)
Patient is resting comfortably. 

## 2022-03-11 NOTE — Consult Note (Signed)
Subjective:   CC: bowel obstruction  HPI:  Jacob Waters is a 67 y.o. male who was consulted by [name] for issue above.  Symptoms were first noted 2 days ago. Pain is sharp, epigastric.  Associated with N/V, exacerbated by nothing specific.  Last BM yesterday, loose, started after pain, N/V.  Currently no complaints     Past Medical History:  has a past medical history of Alcohol abuse, Atrial flutter ([place]), Chronic heart failure with preserved ejection fraction (HFpEF) ([place]), and Hypertension.  Past Surgical History:  Past Surgical History:  Procedure Laterality Date   NO PAST SURGERIES      Family History: family history includes Cancer in his father and mother; Hypertension in his father, mother, and sister.  Social History:  reports that he has never smoked. His smokeless tobacco use includes chew. He reports that he does not currently use alcohol after a past usage of about 24.0 standard drinks of alcohol per week. He reports that he does not use drugs.  Current Medications:  Prior to Admission medications   Medication Sig Start Date End Date Taking? Authorizing Provider  amLODipine (NORVASC) 10 MG tablet Take 1 tablet (10 mg total) by mouth daily. 02/04/22  Yes End, Harrell Gave, MD  apixaban (ELIQUIS) 5 MG TABS tablet Take 1 tablet (5 mg total) by mouth 2 (two) times daily. 01/20/22  Yes End, Harrell Gave, MD  hydrochlorothiazide (HYDRODIURIL) 25 MG tablet Take 1 tablet (25 mg total) by mouth daily. 02/04/22  Yes End, Harrell Gave, MD  isosorbide mononitrate (IMDUR) 30 MG 24 hr tablet Take 1 tablet (30 mg total) by mouth daily. 02/04/22  Yes End, Harrell Gave, MD  sacubitril-valsartan (ENTRESTO) 97-103 MG Take 1 tablet by mouth 2 (two) times daily. 02/04/22  Yes End, Harrell Gave, MD    Allergies:  Allergies as of 03/11/2022   (No Known Allergies)    ROS:  General: Denies weight loss, weight gain, fatigue, fevers, chills, and night sweats. Eyes: Denies blurry vision, double  vision, eye pain, itchy eyes, and tearing. Ears: Denies hearing loss, earache, and ringing in ears. Nose: Denies sinus pain, congestion, infections, runny nose, and nosebleeds. Mouth/throat: Denies hoarseness, sore throat, bleeding gums, and difficulty swallowing. Heart: Denies chest pain, palpitations, racing heart, irregular heartbeat, leg pain or swelling, and decreased activity tolerance. Respiratory: Denies breathing difficulty, shortness of breath, wheezing, cough, and sputum. GI: Denies change in appetite, heartburn, nausea, vomiting, constipation, diarrhea, and blood in stool. GU: Denies difficulty urinating, pain with urinating, urgency, frequency, blood in urine. Musculoskeletal: Denies joint stiffness, pain, swelling, muscle weakness. Skin: Denies rash, itching, mass, tumors, sores, and boils Neurologic: Denies headache, fainting, dizziness, seizures, numbness, and tingling. Psychiatric: Denies depression, anxiety, difficulty sleeping, and memory loss. Endocrine: Denies heat or cold intolerance, and increased thirst or urination. Blood/lymph: Denies easy bruising, easy bruising, and swollen glands     Objective:     BP 130/67   Pulse 96   Temp 98.7 F (37.1 C) (Oral)   Resp 16   Ht 5\' 5"  (1.651 m)   Wt 81.6 kg   SpO2 95%   BMI 29.95 kg/m   Constitutional :  alert, cooperative, appears stated age, and no distress  Lymphatics/Throat:  no asymmetry, masses, or scars  Respiratory:  clear to auscultation bilaterally  Cardiovascular:  regular rate and rhythm  Gastrointestinal: soft, non-tender; bowel sounds normal; no masses,  no organomegaly. Partially reducible non-tender bilateral inguinal hernia  Musculoskeletal: Steady movement  Skin: Cool and moist   Psychiatric: Normal affect,  non-agitated, not confused       LABS:     Latest Ref Rng & Units 03/11/2022   12:48 AM 02/04/2022    9:38 AM 07/25/2021    9:17 AM  CMP  Glucose 70 - 99 mg/dL 180  118  112   BUN 8 -  23 mg/dL 25  23  32   Creatinine 0.61 - 1.24 mg/dL 1.50  1.24  1.45   Sodium 135 - 145 mmol/L 136  137  138   Potassium 3.5 - 5.1 mmol/L 3.8  5.2  4.4   Chloride 98 - 111 mmol/L 91  102  102   CO2 22 - 32 mmol/L 31  25  28    Calcium 8.9 - 10.3 mg/dL 9.9  9.3  9.6   Total Protein 6.5 - 8.1 g/dL 8.7  8.1    Total Bilirubin 0.3 - 1.2 mg/dL 0.7  0.6    Alkaline Phos 38 - 126 U/L 50  47    AST 15 - 41 U/L 26  27    ALT 0 - 44 U/L 19  22        Latest Ref Rng & Units 03/11/2022   12:48 AM 02/04/2022    9:38 AM 06/18/2021    9:19 AM  CBC  WBC 4.0 - 10.5 K/uL 9.1  3.9  3.7   Hemoglobin 13.0 - 17.0 g/dL 13.1  13.1  12.5   Hematocrit 39.0 - 52.0 % 38.2  38.1  36.6   Platelets 150 - 400 K/uL 402  378  345     RADS: Narrative & Impression  CLINICAL DATA:  Acute abdominal pain   EXAM: CT ABDOMEN AND PELVIS WITH CONTRAST   TECHNIQUE: Multidetector CT imaging of the abdomen and pelvis was performed using the standard protocol following bolus administration of intravenous contrast.   RADIATION DOSE REDUCTION: This exam was performed according to the departmental dose-optimization program which includes automated exposure control, adjustment of the mA and/or kV according to patient size and/or use of iterative reconstruction technique.   CONTRAST:  56mL OMNIPAQUE IOHEXOL 300 MG/ML  SOLN   COMPARISON:  None Available.   FINDINGS: Lower chest: Lung bases are clear   Hepatobiliary: The liver is normal. No biliary dilatation. There are multiple stones within the contracted gallbladder.   Pancreas: Unremarkable. No pancreatic ductal dilatation or surrounding inflammatory changes.   Spleen: Normal   Adrenals/Urinary Tract: Adrenal glands are unremarkable. Kidneys are normal, without renal calculi, focal lesion, or hydronephrosis. Bladder is unremarkable.   Stomach/Bowel: The stomach is massively distended and fluid-filled. There is reflux of fluid into the distal esophagus. There  is dilated, fluid-filled small bowel throughout most of the abdomen. There is no clear transition point. The distal ileum is decompressed. The colon is decompressed. There is widespread colonic diverticulosis without acute inflammation. There are large bilateral inguinal hernias, right greater than left. The right inguinal hernia contains the cecum and terminal ileum.   Vascular/Lymphatic: There is calcific aortic atherosclerosis. No lymphadenopathy.   Reproductive: Prostate is normal.   Other: None   Musculoskeletal: No acute or significant osseous findings.   IMPRESSION: 1. Dilated, fluid-filled small bowel throughout most of the abdomen, consistent with small bowel obstruction. No clear transition point. 2. Massively distended and fluid-filled stomach with reflux of fluid into the distal esophagus, consistent with gastric outlet obstruction. 3. Large bilateral inguinal hernias, right greater than left. The right inguinal hernia contains the cecum and terminal ileum.   Aortic Atherosclerosis (  ICD10-I70.0).     Electronically Signed   By: Ulyses Jarred M.D.   On: 03/11/2022 03:53   Assessment:   Bowel obstruction?  Over 1.3L out with NG tube placement.    Plan:   Pending SBFT.  Hernia easily reducible and asymptomatic so unlikely to be cause of issues.  Possible gastric obstruction/gastroparesis?  Cont to monitor in meantime with serial abdominal exams  labs/images/medications/previous chart entries reviewed personally and relevant changes/updates noted above.

## 2022-03-11 NOTE — ED Provider Triage Note (Signed)
Emergency Medicine Provider Triage Evaluation Note  Trevan Messman , a 67 y.o. male  was evaluated in triage.  Pt complains of nausea/vomiting after eating at a restaurant.  Takes Eliquis.  Review of Systems  Positive: Nausea/vomiting Negative: Diarrhea  Physical Exam  Ht 5\' 5"  (1.651 m)   Wt 81.6 kg   BMI 29.95 kg/m  Gen:   Awake, mild to moderate distress   Resp:  Normal effort  MSK:   Moves extremities without difficulty  Other:  Coffee-ground emesis noted.  Medical Decision Making  Medically screening exam initiated at 12:44 AM.  Appropriate orders placed.  Maxemiliano Riel was informed that the remainder of the evaluation will be completed by another provider, this initial triage assessment does not replace that evaluation, and the importance of remaining in the ED until their evaluation is complete.  67 year old male presenting with nausea/vomiting appears like hematemesis, on Eliquis.  Will keep NPO, obtain lab work and chest x-ray while patient awaits treatment room.   Paulette Blanch, MD 03/11/22 [id number]

## 2022-03-11 NOTE — ED Triage Notes (Signed)
Pt to ED from home c/o upper abd pain that is intermittent since 2000 tonight after eating at a restaurant.  N/v approx 6-8 times.  Emesis appears coffee ground, pt is on eliquis.  Dr. Beather Arbour in triage for MSE.

## 2022-03-11 NOTE — ED Notes (Signed)
Patient assisted to bedside commode per request due to needing to have a bowel movement. Patient noted to have had large bowel movement that was very dark in color.

## 2022-03-11 NOTE — ED Notes (Signed)
Upon taking over care for this patient, patient found to be on ER stretcher with gown saturated with gastric contents from NG tube. Patient assisted to bedside chair so this RN could clean patient's bed and linens. Patient's body cleansed with warm bath wipes and dressed in new clean gown. Patient placed on clean hospital bed and states he feels so much better. NG tube canister replaced at this time and readjusted NG tube so it would not leak. Patient resting comfortably in bed at this time.

## 2022-03-11 NOTE — ED Provider Notes (Signed)
[facility] Provider Note    Event Date/Time   First MD Initiated Contact with Patient 03/11/22 0448     (approximate)   History   Abdominal Pain   HPI  Jacob Waters is a 67 y.o. male   Past medical history of atrial flutter on Eliquis, alcohol use, hypertension, polysubstance use who presents to the emergency department with abdominal distention and nausea and vomiting since last night.  Was in regular state of health when he ate a large meal at a steak house felt bloating and had multiple episodes of emesis 1 time was blood-streaked.  He had a bowel movement while in the emergency department has been passing flatus.  No abdominal surgeries.  No urinary symptoms.  No blood from rectum.  Last Eliquis was yesterday at 2 PM.  History was obtained via the patient. His partner is at bedside as collateral informant independent historian. Reviewed external medical note including cardiology visit dated 02/04/2022 where they addressed his paroxysmal atrial flutter, indefinite anticoagulation with apixaban.      Physical Exam   Triage Vital Signs: ED Triage Vitals  Enc Vitals Group     BP 03/11/22 0045 (!) 145/68     Pulse Rate 03/11/22 0045 (!) 105     Resp 03/11/22 0045 20     Temp 03/11/22 0045 98.1 F (36.7 C)     Temp Source 03/11/22 0045 Oral     SpO2 03/11/22 0045 96 %     Weight 03/11/22 0044 180 lb (81.6 kg)     Height 03/11/22 0044 5\' 5"  (1.651 m)     Head Circumference --      Peak Flow --      Pain Score 03/11/22 0044 0     Pain Loc --      Pain Edu? --      Excl. in [place]? --     Most recent vital signs: Vitals:   03/11/22 0045 03/11/22 0229  BP: (!) 145/68 134/83  Pulse: (!) 105 95  Resp: 20 20  Temp: 98.1 F (36.7 C) 98.7 F (37.1 C)  SpO2: 96% 95%    General: Awake, no distress.  CV:  Good peripheral perfusion.  Resp:  Normal effort.  Abd:  Domino distention mild tenderness to palpation diffusely, no rigidity or guarding.   No obvious masses or hernias noted..     ED Results / Procedures / Treatments   Labs (all labs ordered are listed, but only abnormal results are displayed) Labs Reviewed  CBC WITH DIFFERENTIAL/PLATELET - Abnormal; Notable for the following components:      Result Value   HCT 38.2 (*)    Platelets 402 (*)    All other components within normal limits  PROTIME-INR - Abnormal; Notable for the following components:   Prothrombin Time 15.5 (*)    All other components within normal limits  COMPREHENSIVE METABOLIC PANEL - Abnormal; Notable for the following components:   Chloride 91 (*)    Glucose, Bld 180 (*)    BUN 25 (*)    Creatinine, Ser 1.50 (*)    Total Protein 8.7 (*)    GFR, Estimated 51 (*)    All other components within normal limits  RESP PANEL BY RT-PCR (RSV, FLU A&B, COVID)  [id number]  ETHANOL  LIPASE, BLOOD  TYPE AND SCREEN  TROPONIN I (HIGH SENSITIVITY)  TROPONIN I (HIGH SENSITIVITY)     I reviewed labs and they are notable for hemoglobin within normal limits  EKG  ED ECG REPORT I, Lucillie Garfinkel, the attending physician, personally viewed and interpreted this ECG.   Date: 03/11/2022  EKG Time: 0045  Rate: 101  Rhythm: sinus tachycardia  Axis: nl  Intervals:none  ST&T Change: No acute ischemic changes    RADIOLOGY I independently reviewed and interpreted CT of the abdomen pelvis and a markedly distended stomach   PROCEDURES:  Critical Care performed: No  Procedures   MEDICATIONS ORDERED IN ED: Medications  fentaNYL (SUBLIMAZE) injection 50 mcg (50 mcg Intravenous Not Given 03/11/22 0311)  ondansetron (ZOFRAN) injection 4 mg (has no administration in time range)  ondansetron (ZOFRAN-ODT) disintegrating tablet 4 mg (4 mg Oral Given 03/11/22 0047)  ondansetron (ZOFRAN) injection 4 mg (4 mg Intravenous Given 03/11/22 0311)  sodium chloride 0.9 % bolus 1,000 mL (1,000 mLs Intravenous New Bag/Given 03/11/22 0311)  iohexol (OMNIPAQUE) 300 MG/ML solution 80 mL  (80 mLs Intravenous Contrast Given 03/11/22 0327)    Consultants:  I spoke with Dr. Lysle Pearl of general surgery regarding care plan for this patient.   IMPRESSION / MDM / ASSESSMENT AND PLAN / ED COURSE  I reviewed the triage vital signs and the nursing notes.                              Differential diagnosis includes, but is not limited to, bowel obstruction, intra-abdominal infection, bowel perforation, GI bleeding   MDM: Patient with small bowel obstruction unknown transition point and bilateral inguinal hernia some containing bowel, not felt on my exam, unknown if reducible but fortunately patient reports having bowel movement and passing flatus while in the emergency department last about 4 AM.  He continues to have been nauseated and vomiting in the emergency department.  Hemoglobin within normal limits Eliquis 2 PM.  General surgery consulted, ordered for NG tube, admit to hospitalist service.  Patient's presentation is most consistent with acute presentation with potential threat to life or bodily function.       FINAL CLINICAL IMPRESSION(S) / ED DIAGNOSES   Final diagnoses:  Intestinal obstruction, unspecified cause, unspecified whether partial or complete (HCC)  Inguinal hernia without obstruction or gangrene, recurrence not specified, unspecified laterality     Rx / DC Orders   ED Discharge Orders     None        Note:  This document was prepared using Dragon voice recognition software and may include unintentional dictation errors.    Lucillie Garfinkel, MD 03/11/22 [phone]

## 2022-03-11 NOTE — ED Notes (Signed)
Pt to receive oral contrast at 0830.

## 2022-03-12 ENCOUNTER — Inpatient Hospital Stay: Payer: Medicare Other

## 2022-03-12 DIAGNOSIS — K56609 Unspecified intestinal obstruction, unspecified as to partial versus complete obstruction: Secondary | ICD-10-CM | POA: Diagnosis not present

## 2022-03-12 LAB — BASIC METABOLIC PANEL
Anion gap: 14 (ref 5–15)
BUN: 43 mg/dL — ABNORMAL HIGH (ref 8–23)
CO2: 28 mmol/L (ref 22–32)
Calcium: 8.9 mg/dL (ref 8.9–10.3)
Chloride: 96 mmol/L — ABNORMAL LOW (ref 98–111)
Creatinine, Ser: 2.97 mg/dL — ABNORMAL HIGH (ref 0.61–1.24)
GFR, Estimated: 22 mL/min — ABNORMAL LOW (ref >60)
Glucose, Bld: 126 mg/dL — ABNORMAL HIGH (ref 70–99)
Potassium: 4.2 mmol/L (ref 3.5–5.1)
Sodium: 138 mmol/L (ref 135–145)

## 2022-03-12 LAB — MAGNESIUM: Magnesium: 2.6 mg/dL — ABNORMAL HIGH (ref 1.7–2.4)

## 2022-03-12 LAB — HIV ANTIBODY (ROUTINE TESTING W REFLEX): HIV Screen 4th Generation wRfx: NONREACTIVE

## 2022-03-12 NOTE — Consult Note (Signed)
GI Inpatient Consult Note  Reason for Consult: SBO/coffee ground emesis   Attending Requesting Consult: Dr. Si Waters  History of Present Illness: Jacob Waters is a 67 y.o. male seen for evaluation of small bowel obstruction/coffee ground emesis at the request of Dr. Si Waters. PMhx of A flutter on home eliquis, HTN, CKD Stage 3, alcohol and polysubstance abuse, heart failure, atherosclerosis.   Patient reports feeling in his normal state of health until acutely 2 days ago. He denies any chronic nausea, vomiting, epigastric pain, GERD, dysphagia. Was eating well over Christmas holidays and has gained weight this year. Normally has a BM daily and denies constipation, diarrhea, abd pain, rectal bleeding.   Patient reports acutely evening of 03/10/22 after eating dinner developing issues w/ stomach tightening and felt needed to burp or pass gas but was unable. He reports around 4am the next morning symptoms intensified and began vomiting a dark red emesis. Had several episodes of emesis at home w/ worsening abd pain and prompted ED evaluation. He reports since being ER has not had further vomiting. Had a loose green BM last night around 8pm. Feels abd is much less distended over past 24 hrs since NGT placed   He denies any hx of similar symptoms. No prior abd surgeries. No family hx colon polyps or colon cancer.   Drinks 4-5 beers per day. Denies tobacco and nsaid use. Girlfriend at bedside.  Last Colonoscopy: none Last Endoscopy: none   Past Medical History:  Past Medical History:  Diagnosis Date   Alcohol abuse    Atrial flutter ([place])    Chronic heart failure with preserved ejection fraction (HFpEF) ([place])    Hypertension     Problem List: Patient Active Problem List   Diagnosis Date Noted   SBO (small bowel obstruction) ([place]) 03/11/2022   Aortic atherosclerosis ([place]) 03/11/2022   Coffee ground emesis 03/11/2022   Overweight 03/11/2022   Orthopnea 02/04/2022   Tobacco abuse 07/25/2021    Heart failure with preserved ejection fraction ([place]) 06/18/2021   Flutter-fibrillation 11/12/2019   ETOH abuse    Alcohol withdrawal ([place]) 11/11/2019   Accelerated hypertension 11/11/2019   SVT (supraventricular tachycardia)    Hypomagnesemia    Stage 3a chronic kidney disease (HCC)    Nausea and vomiting    Essential hypertension 06/29/2018   Numbness    Polysubstance abuse ([place])    Paroxysmal atrial flutter ([place]) 06/20/2018    Past Surgical History: Past Surgical History:  Procedure Laterality Date   NO PAST SURGERIES      Allergies: No Known Allergies  Home Medications: (Not in a hospital admission)  Home medication reconciliation was completed with the patient.   Scheduled Inpatient Medications:    amLODipine  10 mg Per Tube Daily   isosorbide mononitrate  30 mg Oral Daily   pantoprazole (PROTONIX) IV  40 mg Intravenous Q12H    Continuous Inpatient Infusions:    sodium chloride 125 mL/hr at 03/12/22 0827    PRN Inpatient Medications:  acetaminophen **OR** acetaminophen, HYDROmorphone (DILAUDID) injection, ondansetron **OR** ondansetron (ZOFRAN) IV, traZODone  Family History: family history includes Cancer in his father and mother; Hypertension in his father, mother, and sister.    Social History:   reports that he has never smoked. His smokeless tobacco use includes chew. He reports that he does not currently use alcohol after a past usage of about 24.0 standard drinks of alcohol per week. He reports that he does not use drugs.   Review of Systems: Constitutional: Weight  is stable.  Eyes: No changes in vision. ENT: No oral lesions, sore throat.  GI: see HPI.  Heme/Lymph: No easy bruising.  CV: No chest pain.  GU: No hematuria.  Integumentary: No rashes.  Neuro: No headaches.  Psych: No depression/anxiety.  Endocrine: No heat/cold intolerance.  Allergic/Immunologic: No urticaria.  Resp: No cough, SOB.  Musculoskeletal: No joint swelling.    Physical  Examination: BP (!) 140/76   Pulse 71   Temp 98.1 F (36.7 C) (Oral)   Resp 20   Ht 5\' 5"  (1.651 m)   Wt 81.6 kg   SpO2 94%   BMI 29.95 kg/m  Gen: NAD, alert and oriented x 4. NGT clamped HEENT: PEERLA, EOMI, Neck: supple, no JVD or thyromegaly Chest: CTA bilaterally, no wheezes, crackles, or other adventitious sounds CV: RRR, no m/g/c/r Abd: soft, non distended on exam, non tender, hypoactive BS Ext: no edema, well perfused with 2+ pulses, Skin: no rash or lesions noted Lymph: no LAD  Data: Lab Results  Component Value Date   WBC 8.9 03/11/2022   HGB 11.9 (L) 03/11/2022   HCT 34.9 (L) 03/11/2022   MCV 89.0 03/11/2022   PLT 351 03/11/2022   Recent Labs  Lab 03/11/22 0048 03/11/22 2323  HGB 13.1 11.9*   Lab Results  Component Value Date   NA 138 03/11/2022   K 4.2 03/11/2022   CL 96 (L) 03/11/2022   CO2 28 03/11/2022   BUN 43 (H) 03/11/2022   CREATININE 2.97 (H) 03/11/2022   Lab Results  Component Value Date   ALT 19 03/11/2022   AST 26 03/11/2022   ALKPHOS 50 03/11/2022   BILITOT 0.7 03/11/2022   Recent Labs  Lab 03/11/22 0048  INR 1.2   CT a/p - yesterday 03/11/21-IMPRESSION: 1. Dilated, fluid-filled small bowel throughout most of the abdomen, consistent with small bowel obstruction. No clear transition point. 2. Massively distended and fluid-filled stomach with reflux of fluid into the distal esophagus, consistent with gastric outlet obstruction. 3. Large bilateral inguinal hernias, right greater than left. The right inguinal hernia contains the cecum and terminal ileum.   ABD xray s/p 90cc gastrografin -  IMPRESSION: 1. The contrast persists the dilated proximal small bowel loops on the 8 hour images  Admission labs - Hgb 11.9, INR 1.2. Cr 1.5, glucose 180, Cl 9, BUN 25. Lipase normal.   Assessment/Plan: Jacob Waters is a 67 y.o. male admitted for SBO.  SBO-Dr. 79 reviewed films w/ radiology, transition point appears to be in RLQ (proximal  ileum vs. Distal jejunum) with angulation, part of cecum is contained in hernia. Proximal ileum/distal jejunum would not be reachable endoscopically and will hold off EGD at this time, EGD may exacerbate air distention as well. CT suggested gastric outlet obstruction but contrast passing into small bowel on gastrografin study. Surgery following and will defer further management to surgery. Abd xray ordered. Coffee ground emesis - due to #1. Hgb stable. 1 gram drop likely dilutional. Hold off on EGD. Monitor H/h daily AKI on CKD - increase in Cr overnight, likely dehydration from repetitive vomiting and did receive IV contrast yesterday, renal Timothy Lasso negative. Primary team following Atrial fib HFpEF-compensated HTN Alcohol abuse- 4-5 beers per day, monitor while inpatient  Recommendations: -Defer further evaluation to surgery who is following. GI to sign off but please call with questions if needed.   Case was discussed with Dr. [place]. Thank you for the consult. Please call with questions or concerns.  Timothy Lasso, PA-C  [facility]

## 2022-03-12 NOTE — Progress Notes (Signed)
NGT clamped at 0725 as ordered.

## 2022-03-12 NOTE — TOC Initial Note (Signed)
Transition of Care [facility]) - Initial/Assessment Note    Patient Details  Name: Jacob Waters MRN: [id number] Date of Birth: 27-Oct-1955  Transition of Care [facility]) CM/SW Contact:    Beverly Sessions, RN Phone Number: 03/12/2022, 3:28 PM  Clinical Narrative:                   Transition of Care [facility]) Screening Note   Patient Details  Name: Jacob Waters Date of Birth: [birth year]   Transition of Care [facility]) CM/SW Contact:    Beverly Sessions, RN Phone Number: 03/12/2022, 3:28 PM    Transition of Care Department [facility]) has reviewed patient and no TOC needs have been identified at this time. We will continue to monitor patient advancement through interdisciplinary progression rounds. If new patient transition needs arise, please place a TOC consult.  No PCP listed or noted in care everywhere List of local PCP added to AVS       Patient Goals and CMS Choice            Expected Discharge Plan and Services                                              Prior Living Arrangements/Services                       Activities of Daily Living Home Assistive Devices/Equipment: Eyeglasses ADL Screening (condition at time of admission) Patient's cognitive ability adequate to safely complete daily activities?: Yes Is the patient deaf or have difficulty hearing?: No Does the patient have difficulty seeing, even when wearing glasses/contacts?: No Does the patient have difficulty concentrating, remembering, or making decisions?: No Patient able to express need for assistance with ADLs?: Yes Does the patient have difficulty dressing or bathing?: No Independently performs ADLs?: Yes (appropriate for developmental age) Does the patient have difficulty walking or climbing stairs?: No Weakness of Legs: None Weakness of Arms/Hands: None  Permission Sought/Granted                  Emotional Assessment              Admission diagnosis:  Small bowel obstruction  ([place]) [K56.609] SBO (small bowel obstruction) ([place]) [K56.609] Inguinal hernia without obstruction or gangrene, recurrence not specified, unspecified laterality [K40.90] Intestinal obstruction, unspecified cause, unspecified whether partial or complete ([place]) [K56.609] Patient Active Problem List   Diagnosis Date Noted   SBO (small bowel obstruction) ([place]) 03/11/2022   Aortic atherosclerosis ([place]) 03/11/2022   Coffee ground emesis 03/11/2022   Overweight 03/11/2022   Orthopnea 02/04/2022   Tobacco abuse 07/25/2021   Heart failure with preserved ejection fraction ([place]) 06/18/2021   Flutter-fibrillation 11/12/2019   ETOH abuse    Alcohol withdrawal ([place]) 11/11/2019   Accelerated hypertension 11/11/2019   SVT (supraventricular tachycardia)    Hypomagnesemia    Stage 3a chronic kidney disease ([place])    Nausea and vomiting    Essential hypertension 06/29/2018   Numbness    Polysubstance abuse ([place])    Paroxysmal atrial flutter ([place]) 06/20/2018   PCP:  Patient, No Pcp Per Pharmacy:   [facility] [address] ([address]), [place] - [address] [place]  [address] Phone: [phone] Fax: [facility] [place], [place] - [address] [place]   [address] Phone: [phone] Fax: [phone]  [facility] [address] [place] [address] Phone: [phone] Fax: [phone]  [place], [address] [place] [address] Phone: [phone] Fax: [phone]     Social Determinants of Health (SDOH) Social History: [place]: Food Insecurity Present (03/11/2022)  Housing: Low Risk  (03/11/2022)  Transportation Needs: No Transportation Needs (03/11/2022)  Utilities: Not At Risk (03/11/2022)  Tobacco Use: High Risk (03/11/2022)   SDOH  Interventions:     Readmission Risk Interventions     No data to display

## 2022-03-12 NOTE — Progress Notes (Signed)
Subjective:  CC: Jacob Waters is a 67 y.o. male  Hospital stay day 1,   SBO  HPI: No complaints overnight.  Had 1 large bowel movement but has been only passing flatus since.  Denies any pain or nausea  ROS:  General: Denies weight loss, weight gain, fatigue, fevers, chills, and night sweats. Heart: Denies chest pain, palpitations, racing heart, irregular heartbeat, leg pain or swelling, and decreased activity tolerance. Respiratory: Denies breathing difficulty, shortness of breath, wheezing, cough, and sputum. GI: Denies change in appetite, heartburn, nausea, vomiting, constipation, diarrhea, and blood in stool. GU: Denies difficulty urinating, pain with urinating, urgency, frequency, blood in urine.   Objective:   Temp:  [98 F (36.7 C)-98.5 F (36.9 C)] 98.5 F (36.9 C) (01/04 1348) Pulse Rate:  [61-97] 76 (01/04 1348) Resp:  [12-28] 20 (01/04 1348) BP: (119-157)/(52-93) 153/88 (01/04 1348) SpO2:  [90 %-100 %] 94 % (01/04 1348)     Height: 5\' 5"  (165.1 cm) Weight: 81.6 kg BMI (Calculated): 29.95   Intake/Output this shift:   Intake/Output Summary (Last 24 hours) at 03/12/2022 1506 Last data filed at 03/11/2022 2053 Gross per 24 hour  Intake --  Output 1900 ml  Net -1900 ml    Constitutional :  alert, cooperative, appears stated age, and no distress  Respiratory:  clear to auscultation bilaterally  Cardiovascular:  regular rate and rhythm  Gastrointestinal: soft, non-tender; bowel sounds normal; no masses,  no organomegaly.  NG with mucous output only  Skin: Cool and moist.   Psychiatric: Normal affect, non-agitated, not confused       LABS:     Latest Ref Rng & Units 03/11/2022   11:23 PM 03/11/2022   12:48 AM 02/04/2022    9:38 AM  CMP  Glucose 70 - 99 mg/dL 126  180  118   BUN 8 - 23 mg/dL 43  25  23   Creatinine 0.61 - 1.24 mg/dL 2.97  1.50  1.24   Sodium 135 - 145 mmol/L 138  136  137   Potassium 3.5 - 5.1 mmol/L 4.2  3.8  5.2   Chloride 98 - 111 mmol/L 96  91   102   CO2 22 - 32 mmol/L 28  31  25    Calcium 8.9 - 10.3 mg/dL 8.9  9.9  9.3   Total Protein 6.5 - 8.1 g/dL  8.7  8.1   Total Bilirubin 0.3 - 1.2 mg/dL  0.7  0.6   Alkaline Phos 38 - 126 U/L  50  47   AST 15 - 41 U/L  26  27   ALT 0 - 44 U/L  19  22       Latest Ref Rng & Units 03/11/2022   11:23 PM 03/11/2022   12:48 AM 02/04/2022    9:38 AM  CBC  WBC 4.0 - 10.5 K/uL 8.9  9.1  3.9   Hemoglobin 13.0 - 17.0 g/dL 11.9  13.1  13.1   Hematocrit 39.0 - 52.0 % 34.9  38.2  38.1   Platelets 150 - 400 K/uL 351  402  378     RADS: CLINICAL DATA:  Small bowel obstruction.   EXAM: ABDOMEN - 1 VIEW   COMPARISON:  March 11, 2022.   FINDINGS: No abnormal bowel dilatation is noted. Nasogastric tube tip is seen in proximal stomach. Residual contrast is seen throughout the nondilated colon and rectum.   IMPRESSION: No abnormal bowel dilatation.     Electronically Signed   By:  Marijo Conception M.D.   On: 03/12/2022 14:47 Assessment:   Small bowel obstruction.  Imaging noted as above as well as reported bowel movement and passing flatus.  However, due to the lack of additional bowel movements and large amounts of initial feculent output from the NG on admission, will keep the NG to clamp for now and do ice chips.  Await further bowel movements and or repeat 24-hour small bowel follow-through KUB.  Serial abdominal exams in the meantime.  labs/images/medications/previous chart entries reviewed personally and relevant changes/updates noted above.

## 2022-03-12 NOTE — Progress Notes (Signed)
PROGRESS NOTE    Jacob Waters  DVV:[id number] DOB: [birth year] DOA: 03/11/2022 PCP: Patient, No Pcp Per  Outpatient Specialists: cardiology    Brief Narrative:   From admission h and p  Jacob Waters is a 67 y.o. male with medical history significant of alcohol abuse in remission, history of alcohol withdrawal, history of smokeless tobacco use, paroxysmal atrial flutter, SVT, stage 3a CKD, chronic diastolic heart failure, hypertension, overweight with a BMI of 29.95 kg/m who is coming to the emergency department with abdominal pain since yesterday evening after he went to eat to a restaurant with some of his coworkers,, then later in the evening having abdominal pain associated with about 10 episodes of emesis according to the patient.  Emesis described as coffee-ground.  No diarrhea or constipation.  No melena or hematochezia no previous history of SBO.  No previous history of surgery.He denied fever, chills, rhinorrhea, sore throat, wheezing or hemoptysis.  No chest pain, palpitations, diaphoresis, PND, orthopnea or pitting edema of the lower extremities. No flank pain, dysuria, frequency or hematuria.  No polyuria, polydipsia, polyphagia or blurred vision.   Assessment & Plan:   Principal Problem:   SBO (small bowel obstruction) (HCC) Active Problems:   Paroxysmal atrial flutter (HCC)   Polysubstance abuse (HCC)   Essential hypertension   Stage 3a chronic kidney disease (HCC)   ETOH abuse   Heart failure with preserved ejection fraction (HCC)   Aortic atherosclerosis (HCC)   Coffee ground emesis   Overweight  # Small bowel obstruction # Gastric outlet obstruction? # Coffee ground emesis Abd pain and nausea/vomiting have resolved. CT with possible gastric outlet obstruction. Hgb and hemodynamics. NT tube with dark output appears to be bloody. Gen surg following. Denies regular nsaid use. - NG tube clamp trial currently underway - will discuss next steps with gen surg. GI consult  for EGD? - continue PPI - holding home apixaban  # Acute kidney injury on ckd 3a Cr this morning 2.97 from 1.5 on presentation, baseline appears to be around 1.3. Did receive contrast with CT yesterday. Making urine - continue IV hydration - renal u/s - trend  # Paroxysmal a fib Here in sinus. Not on BB. - holding home apixaban  # HFpEF Compensated - cont home imdur - holding home entresto given aki  # HTN Here bp appropriate - home amlodipine when taking po - holding home hctz given aki  # Alcohol abuse Says has been a very moderate drinker for some time - monitor   DVT prophylaxis: SCDs Code Status: full Family Communication: none @ bedside  Level of care: Med-Surg Status is: Inpatient Remains inpatient appropriate because: severity of illness    Consultants:  Gen surg  Procedures: none  Antimicrobials:  none    Subjective: Feeling well, no abd pain or nausea/vomiting. Has stooled and passing flatus  Objective: Vitals:   03/12/22 0400 03/12/22 0430 03/12/22 0500 03/12/22 0606  BP: (!) 119/52 133/62 (!) 136/59   Pulse: 73 62 61   Resp: 17 15 14    Temp:    98.1 F (36.7 C)  TempSrc:    Oral  SpO2: 96% 93% 92%   Weight:      Height:        Intake/Output Summary (Last 24 hours) at 03/12/2022 6269 Last data filed at 03/11/2022 2053 Gross per 24 hour  Intake --  Output 1900 ml  Net -1900 ml   Filed Weights   03/11/22 0044  Weight: 81.6 kg  Examination:  General exam: Appears calm and comfortable  Respiratory system: Clear to auscultation. Respiratory effort normal. Cardiovascular system: S1 & S2 heard, RRR. No JVD, murmurs, rubs, gallops or clicks. No pedal edema. Gastrointestinal system: Abdomen is nondistended, soft and nontender. No organomegaly or masses felt. Normal bowel sounds heard. Central nervous system: Alert and oriented. No focal neurological deficits. Extremities: Symmetric 5 x 5 power. Skin: No rashes, lesions or  ulcers Psychiatry: Judgement and insight appear normal. Mood & affect appropriate.     Data Reviewed: I have personally reviewed following labs and imaging studies  CBC: Recent Labs  Lab 03/11/22 0048 03/11/22 2323  WBC 9.1 8.9  NEUTROABS 7.7  --   HGB 13.1 11.9*  HCT 38.2* 34.9*  MCV 87.4 89.0  PLT 402* 351   Basic Metabolic Panel: Recent Labs  Lab 03/11/22 0048 03/11/22 0230 03/11/22 2323  NA 136  --  138  K 3.8  --  4.2  CL 91*  --  96*  CO2 31  --  28  GLUCOSE 180*  --  126*  BUN 25*  --  43*  CREATININE 1.50*  --  2.97*  CALCIUM 9.9  --  8.9  MG  --  2.3 2.6*   GFR: Estimated Creatinine Clearance: 23.7 mL/min (A) (by C-G formula based on SCr of 2.97 mg/dL (H)). Liver Function Tests: Recent Labs  Lab 03/11/22 0048  AST 26  ALT 19  ALKPHOS 50  BILITOT 0.7  PROT 8.7*  ALBUMIN 5.0   Recent Labs  Lab 03/11/22 0048  LIPASE 41   No results for input(s): "AMMONIA" in the last 168 hours. Coagulation Profile: Recent Labs  Lab 03/11/22 0048  INR 1.2   Cardiac Enzymes: No results for input(s): "CKTOTAL", "CKMB", "CKMBINDEX", "TROPONINI" in the last 168 hours. BNP (last 3 results) No results for input(s): "PROBNP" in the last 8760 hours. HbA1C: No results for input(s): "HGBA1C" in the last 72 hours. CBG: No results for input(s): "GLUCAP" in the last 168 hours. Lipid Profile: No results for input(s): "CHOL", "HDL", "LDLCALC", "TRIG", "CHOLHDL", "LDLDIRECT" in the last 72 hours. Thyroid Function Tests: No results for input(s): "TSH", "T4TOTAL", "FREET4", "T3FREE", "THYROIDAB" in the last 72 hours. Anemia Panel: No results for input(s): "VITAMINB12", "FOLATE", "FERRITIN", "TIBC", "IRON", "RETICCTPCT" in the last 72 hours. Urine analysis:    Component Value Date/Time   COLORURINE STRAW (A) 06/20/2018 1923   APPEARANCEUR CLEAR (A) 06/20/2018 1923   LABSPEC 1.003 (L) 06/20/2018 1923   PHURINE 6.0 06/20/2018 1923   GLUCOSEU NEGATIVE 06/20/2018 1923    HGBUR SMALL (A) 06/20/2018 1923   BILIRUBINUR NEGATIVE 06/20/2018 1923   KETONESUR NEGATIVE 06/20/2018 1923   PROTEINUR 30 (A) 06/20/2018 1923   NITRITE NEGATIVE 06/20/2018 1923   LEUKOCYTESUR NEGATIVE 06/20/2018 1923   Sepsis Labs: @LABRCNTIP (procalcitonin:4,lacticidven:4)  ) Recent Results (from the past 240 hour(s))  Resp panel by RT-PCR (RSV, Flu A&Waters, Covid) Anterior Nasal Swab     Status: None   Collection Time: 03/11/22 12:48 AM   Specimen: Anterior Nasal Swab  Result Value Ref Range Status   SARS Coronavirus 2 by RT PCR NEGATIVE NEGATIVE Final    Comment: (NOTE) SARS-CoV-2 target nucleic acids are NOT DETECTED.  The SARS-CoV-2 RNA is generally detectable in upper respiratory specimens during the acute phase of infection. The lowest concentration of SARS-CoV-2 viral copies this assay can detect is 138 copies/mL. A negative result does not preclude SARS-Cov-2 infection and should not be used as the sole basis for  treatment or other patient management decisions. A negative result may occur with  improper specimen collection/handling, submission of specimen other than nasopharyngeal swab, presence of viral mutation(s) within the areas targeted by this assay, and inadequate number of viral copies(<138 copies/mL). A negative result must be combined with clinical observations, patient history, and epidemiological information. The expected result is Negative.  Fact Sheet for Patients:  BloggerCourse.com  Fact Sheet for Healthcare Providers:  SeriousBroker.it  This test is no t yet approved or cleared by the [place] FDA and  has been authorized for detection and/or diagnosis of SARS-CoV-2 by FDA under an Emergency Use Authorization (EUA). This EUA will remain  in effect (meaning this test can be used) for the duration of the COVID-19 declaration under Section 564(Waters)(1) of the Act, 21 U.S.C.section 360bbb-3(Waters)(1), unless  the authorization is terminated  or revoked sooner.       Influenza A by PCR NEGATIVE NEGATIVE Final   Influenza Waters by PCR NEGATIVE NEGATIVE Final    Comment: (NOTE) The Xpert Xpress SARS-CoV-2/FLU/RSV plus assay is intended as an aid in the diagnosis of influenza from Nasopharyngeal swab specimens and should not be used as a sole basis for treatment. Nasal washings and aspirates are unacceptable for Xpert Xpress SARS-CoV-2/FLU/RSV testing.  Fact Sheet for Patients: BloggerCourse.com  Fact Sheet for Healthcare Providers: SeriousBroker.it  This test is not yet approved or cleared by the [place] FDA and has been authorized for detection and/or diagnosis of SARS-CoV-2 by FDA under an Emergency Use Authorization (EUA). This EUA will remain in effect (meaning this test can be used) for the duration of the COVID-19 declaration under Section 564(Waters)(1) of the Act, 21 U.S.C. section 360bbb-3(Waters)(1), unless the authorization is terminated or revoked.     Resp Syncytial Virus by PCR NEGATIVE NEGATIVE Final    Comment: (NOTE) Fact Sheet for Patients: BloggerCourse.com  Fact Sheet for Healthcare Providers: SeriousBroker.it  This test is not yet approved or cleared by the [place] FDA and has been authorized for detection and/or diagnosis of SARS-CoV-2 by FDA under an Emergency Use Authorization (EUA). This EUA will remain in effect (meaning this test can be used) for the duration of the COVID-19 declaration under Section 564(Waters)(1) of the Act, 21 U.S.C. section 360bbb-3(Waters)(1), unless the authorization is terminated or revoked.  Performed at [facility], [address] [place], [place] [address]          Radiology Studies: DG Abd Portable 1V-Small Bowel Obstruction Protocol-initial, 8 hr delay  Result Date: 03/11/2022 CLINICAL DATA:  Small bowel obstruction,  8 hour delay image status post 90 cc of Gastrografin administered through nasogastric tube. EXAM: PORTABLE ABDOMEN - 1 VIEW COMPARISON:  03/11/2022 at 2:31 p.m. FINDINGS: The contrast medium is primarily within loops of small bowel in the central abdomen. Currently no definite colonic contrast is observed. There are dilated loops of small bowel measuring up to about 4.8 cm in diameter. There is also some residual contrast in the stomach fundus. Right upper quadrant calcifications compatible with gallstones. There is evidence widespread colonic diverticulosis. There is a small amount of contrast medium in the urinary bladder likely left over from the prior CT scan. IMPRESSION: 1. The contrast persists the dilated proximal small bowel loops on the 8 hour images. Electronically Signed   By: Gaylyn Rong M.D.   On: 03/11/2022 17:02   DG Abd 1 View  Result Date: 03/11/2022 CLINICAL DATA:  Evaluate NG tube placement. EXAM: ABDOMEN - 1 VIEW COMPARISON:  earlier today FINDINGS: Enteric tube tip and side port are in the mid stomach below the level of the GE junction. Gaseous distension of the stomach is again seen. The gas and stool within the colon is noted as before. IMPRESSION: Enteric tube tip and side port are in the mid stomach. Electronically Signed   By: Kerby Moors M.D.   On: 03/11/2022 14:43   DG Abdomen 1 View  Result Date: 03/11/2022 CLINICAL DATA:  NG tube placement. EXAM: ABDOMEN - 1 VIEW COMPARISON:  None Available. FINDINGS: NG tube tip is in the mid stomach with proximal side port below the GE junction. Gaseous distention of the stomach evident. Minimal basilar atelectasis noted in both lungs. Nonspecific bowel gas pattern in the upper abdomen IMPRESSION: NG tube tip is in the mid stomach. Electronically Signed   By: Misty Stanley M.D.   On: 03/11/2022 06:57   CT Abdomen Pelvis W Contrast  Result Date: 03/11/2022 CLINICAL DATA:  Acute abdominal pain EXAM: CT ABDOMEN AND PELVIS WITH CONTRAST  TECHNIQUE: Multidetector CT imaging of the abdomen and pelvis was performed using the standard protocol following bolus administration of intravenous contrast. RADIATION DOSE REDUCTION: This exam was performed according to the departmental dose-optimization program which includes automated exposure control, adjustment of the mA and/or kV according to patient size and/or use of iterative reconstruction technique. CONTRAST:  71mL OMNIPAQUE IOHEXOL 300 MG/ML  SOLN COMPARISON:  None Available. FINDINGS: Lower chest: Lung bases are clear Hepatobiliary: The liver is normal. No biliary dilatation. There are multiple stones within the contracted gallbladder. Pancreas: Unremarkable. No pancreatic ductal dilatation or surrounding inflammatory changes. Spleen: Normal Adrenals/Urinary Tract: Adrenal glands are unremarkable. Kidneys are normal, without renal calculi, focal lesion, or hydronephrosis. Bladder is unremarkable. Stomach/Bowel: The stomach is massively distended and fluid-filled. There is reflux of fluid into the distal esophagus. There is dilated, fluid-filled small bowel throughout most of the abdomen. There is no clear transition point. The distal ileum is decompressed. The colon is decompressed. There is widespread colonic diverticulosis without acute inflammation. There are large bilateral inguinal hernias, right greater than left. The right inguinal hernia contains the cecum and terminal ileum. Vascular/Lymphatic: There is calcific aortic atherosclerosis. No lymphadenopathy. Reproductive: Prostate is normal. Other: None Musculoskeletal: No acute or significant osseous findings. IMPRESSION: 1. Dilated, fluid-filled small bowel throughout most of the abdomen, consistent with small bowel obstruction. No clear transition point. 2. Massively distended and fluid-filled stomach with reflux of fluid into the distal esophagus, consistent with gastric outlet obstruction. 3. Large bilateral inguinal hernias, right greater  than left. The right inguinal hernia contains the cecum and terminal ileum. Aortic Atherosclerosis (ICD10-I70.0). Electronically Signed   By: Ulyses Jarred M.D.   On: 03/11/2022 03:53   DG Chest 1 View  Result Date: 03/11/2022 CLINICAL DATA:  Cough and ground emesis EXAM: PORTABLE CHEST 1 VIEW COMPARISON:  11/11/2019 FINDINGS: Cardiac shadow is stable. Aortic calcifications are seen. Lungs are well aerated bilaterally. No bony abnormality is seen. IMPRESSION: No active disease. Electronically Signed   By: Inez Catalina M.D.   On: 03/11/2022 01:24        Scheduled Meds:  amLODipine  10 mg Per Tube Daily   enoxaparin (LOVENOX) injection  40 mg Subcutaneous Q24H   isosorbide mononitrate  30 mg Oral Daily   pantoprazole (PROTONIX) IV  40 mg Intravenous Q12H   Continuous Infusions:  sodium chloride 100 mL/hr at 03/11/22 2222     LOS: 1 day     Jacob Waters  Karsin Pesta, MD [facility]   If 7PM-7AM, please contact night-coverage [email or web address] Password [email or web address] 03/12/2022, 8:22 AM

## 2022-03-12 NOTE — Discharge Instructions (Signed)
Some PCP options in [place] area- not a comprehensive list  [facility]- [phone] - [phone] [facility]- [phone] [facility]- [phone] [facility]- [phone] [facility]- [phone]  or [place] [facility]erral Line [phone]  

## 2022-03-13 DIAGNOSIS — K56609 Unspecified intestinal obstruction, unspecified as to partial versus complete obstruction: Secondary | ICD-10-CM | POA: Diagnosis not present

## 2022-03-13 LAB — CBC
HCT: 31.3 % — ABNORMAL LOW (ref 39.0–52.0)
Hemoglobin: 10.3 g/dL — ABNORMAL LOW (ref 13.0–17.0)
MCH: 29.7 pg (ref 26.0–34.0)
MCHC: 32.9 g/dL (ref 30.0–36.0)
MCV: 90.2 fL (ref 80.0–100.0)
Platelets: 312 10*3/uL (ref 150–400)
RBC: 3.47 MIL/uL — ABNORMAL LOW (ref 4.22–5.81)
RDW: 12.8 % (ref 11.5–15.5)
WBC: 7.4 10*3/uL (ref 4.0–10.5)
nRBC: 0 % (ref 0.0–0.2)

## 2022-03-13 LAB — BASIC METABOLIC PANEL
Anion gap: 10 (ref 5–15)
BUN: 36 mg/dL — ABNORMAL HIGH (ref 8–23)
CO2: 23 mmol/L (ref 22–32)
Calcium: 8.4 mg/dL — ABNORMAL LOW (ref 8.9–10.3)
Chloride: 107 mmol/L (ref 98–111)
Creatinine, Ser: 1.59 mg/dL — ABNORMAL HIGH (ref 0.61–1.24)
GFR, Estimated: 47 mL/min — ABNORMAL LOW (ref >60)
Glucose, Bld: 100 mg/dL — ABNORMAL HIGH (ref 70–99)
Potassium: 3.5 mmol/L (ref 3.5–5.1)
Sodium: 140 mmol/L (ref 135–145)

## 2022-03-13 NOTE — Discharge Summary (Signed)
Jacob Waters WER:[id number] DOB: May 08, 1955 DOA: 03/11/2022  PCP: Patient, No Pcp Per  Admit date: 03/11/2022 Discharge date: 03/13/2022  Time spent: 35 minutes  Recommendations for Outpatient Follow-up:  Gen surg f/u 1 week Pcp f/u, check bmp at f/u     Discharge Diagnoses:  Principal Problem:   SBO (small bowel obstruction) (HCC) Active Problems:   Paroxysmal atrial flutter (HCC)   Polysubstance abuse (HCC)   Essential hypertension   Stage 3a chronic kidney disease (HCC)   ETOH abuse   Heart failure with preserved ejection fraction (HCC)   Aortic atherosclerosis (HCC)   Coffee ground emesis   Overweight   Discharge Condition: improved  Diet recommendation: heart healthy  Filed Weights   03/11/22 0044  Weight: 81.6 kg    History of present illness:  From admission h and p Jacob Waters is a 67 y.o. male with medical history significant of alcohol abuse in remission, history of alcohol withdrawal, history of smokeless tobacco use, paroxysmal atrial flutter, SVT, stage 3a CKD, chronic diastolic heart failure, hypertension, overweight with a BMI of 29.95 kg/m who is coming to the emergency department with abdominal pain since yesterday evening after he went to eat to a restaurant with some of his coworkers,, then later in the evening having abdominal pain associated with about 10 episodes of emesis according to the patient.  Emesis described as coffee-ground.  No diarrhea or constipation.  No melena or hematochezia no previous history of SBO.  No previous history of surgery.He denied fever, chills, rhinorrhea, sore throat, wheezing or hemoptysis.  No chest pain, palpitations, diaphoresis, PND, orthopnea or pitting edema of the lower extremities. No flank pain, dysuria, frequency or hematuria.  No polyuria, polydipsia, polyphagia or blurred vision   Hospital Course:  Patient presented with abdominal pain and coffee-ground emesis. No history of abdominal surgery. CT showed small  bowel obstruction with large bilateral inguinal hernias and intestines (cecum and terminal ileum) in the right hernia. Patient was treated with NPO status and NG tube. Patient's pain resolved and NT tube was discontinued. Patient now without abdominal pain, tolerating diet, and stooling. LUB shows resolution of SBO> Best guess is the SBO was mechanical 2/2 the inguinal hernia. Gen surg advises f/u in 1 week for planning for repair of his inguinal hernias. Patient did develop AKI here likely 2/2 vomiting and IV contrast. Kidney function improved day of discharge, is close to baseline. OK to resume home meds, push hydration, and advising check of kidney function at pcp f/u.   Procedures: none   Consultations: Gen surg, GI  Discharge Exam: Vitals:   03/13/22 0750 03/13/22 0918  BP: (!) 150/73 (!) 161/87  Pulse: 80 77  Resp: 16 18  Temp: 98.4 F (36.9 C) 98.4 F (36.9 C)  SpO2: 98% 100%    General: NAD Cardiovascular: RRR Respiratory: CTAB Abdomen: mildly distended, soft and non-tender, normal bowel sounds.  Discharge Instructions   Discharge Instructions     Diet - low sodium heart healthy   Complete by: As directed    Increase activity slowly   Complete by: As directed       Allergies as of 03/13/2022   No Known Allergies      Medication List     TAKE these medications    amLODipine 10 MG tablet Commonly known as: NORVASC Take 1 tablet (10 mg total) by mouth daily.   apixaban 5 MG Tabs tablet Commonly known as: ELIQUIS Take 1 tablet (5 mg total) by mouth 2 (  two) times daily.   Entresto 97-103 MG Generic drug: sacubitril-valsartan Take 1 tablet by mouth 2 (two) times daily.   hydrochlorothiazide 25 MG tablet Commonly known as: HYDRODIURIL Take 1 tablet (25 mg total) by mouth daily.   isosorbide mononitrate 30 MG 24 hr tablet Commonly known as: IMDUR Take 1 tablet (30 mg total) by mouth daily.       No Known Allergies  Follow-up Information      Lysle Pearl, Isami, DO Follow up.   Specialty: Surgery Why: call to schedule an appointment in 1 week Contact information: [address] [place] [address] [phone]                  The results of significant diagnostics from this hospitalization (including imaging, microbiology, ancillary and laboratory) are listed below for reference.    Significant Diagnostic Studies: DG Abd 1 View  Result Date: 03/12/2022 CLINICAL DATA:  Small bowel obstruction. EXAM: ABDOMEN - 1 VIEW COMPARISON:  March 11, 2022. FINDINGS: No abnormal bowel dilatation is noted. Nasogastric tube tip is seen in proximal stomach. Residual contrast is seen throughout the nondilated colon and rectum. IMPRESSION: No abnormal bowel dilatation. Electronically Signed   By: Marijo Conception M.D.   On: 03/12/2022 14:47   US RENAL  Result Date: 03/12/2022 CLINICAL DATA:  Renal dysfunction EXAM: RENAL / URINARY TRACT ULTRASOUND COMPLETE COMPARISON:  None Available. FINDINGS: Right Kidney: Renal measurements: 9.4 x 4.7 x 5.2 cm = volume: 119 mL. There is no hydronephrosis. There is 2 x 1.9 cm cyst in the upper pole. Left Kidney: Renal measurements: 11.2 x 5.4 x 5 cm = volume: 157 mL. Echogenicity within normal limits. No mass or hydronephrosis visualized. Bladder: Appears normal for degree of bladder distention. Other: Prostate is enlarged. Incidental note is made of gallbladder stones. IMPRESSION: There is no hydronephrosis.  Right renal cyst. Gallbladder stones. Electronically Signed   By: Elmer Picker M.D.   On: 03/12/2022 09:42   DG Abd Portable 1V-Small Bowel Obstruction Protocol-initial, 8 hr delay  Result Date: 03/11/2022 CLINICAL DATA:  Small bowel obstruction, 8 hour delay image status post 90 cc of Gastrografin administered through nasogastric tube. EXAM: PORTABLE ABDOMEN - 1 VIEW COMPARISON:  03/11/2022 at 2:31 p.m. FINDINGS: The contrast medium is primarily within loops of small bowel in the central abdomen.  Currently no definite colonic contrast is observed. There are dilated loops of small bowel measuring up to about 4.8 cm in diameter. There is also some residual contrast in the stomach fundus. Right upper quadrant calcifications compatible with gallstones. There is evidence widespread colonic diverticulosis. There is a small amount of contrast medium in the urinary bladder likely left over from the prior CT scan. IMPRESSION: 1. The contrast persists the dilated proximal small bowel loops on the 8 hour images. Electronically Signed   By: Van Clines M.D.   On: 03/11/2022 17:02   DG Abd 1 View  Result Date: 03/11/2022 CLINICAL DATA:  Evaluate NG tube placement. EXAM: ABDOMEN - 1 VIEW COMPARISON:  earlier today FINDINGS: Enteric tube tip and side port are in the mid stomach below the level of the GE junction. Gaseous distension of the stomach is again seen. The gas and stool within the colon is noted as before. IMPRESSION: Enteric tube tip and side port are in the mid stomach. Electronically Signed   By: Kerby Moors M.D.   On: 03/11/2022 14:43   DG Abdomen 1 View  Result Date: 03/11/2022 CLINICAL DATA:  NG  tube placement. EXAM: ABDOMEN - 1 VIEW COMPARISON:  None Available. FINDINGS: NG tube tip is in the mid stomach with proximal side port below the GE junction. Gaseous distention of the stomach evident. Minimal basilar atelectasis noted in both lungs. Nonspecific bowel gas pattern in the upper abdomen IMPRESSION: NG tube tip is in the mid stomach. Electronically Signed   By: Misty Stanley M.D.   On: 03/11/2022 06:57   CT Abdomen Pelvis W Contrast  Result Date: 03/11/2022 CLINICAL DATA:  Acute abdominal pain EXAM: CT ABDOMEN AND PELVIS WITH CONTRAST TECHNIQUE: Multidetector CT imaging of the abdomen and pelvis was performed using the standard protocol following bolus administration of intravenous contrast. RADIATION DOSE REDUCTION: This exam was performed according to the departmental  dose-optimization program which includes automated exposure control, adjustment of the mA and/or kV according to patient size and/or use of iterative reconstruction technique. CONTRAST:  14mL OMNIPAQUE IOHEXOL 300 MG/ML  SOLN COMPARISON:  None Available. FINDINGS: Lower chest: Lung bases are clear Hepatobiliary: The liver is normal. No biliary dilatation. There are multiple stones within the contracted gallbladder. Pancreas: Unremarkable. No pancreatic ductal dilatation or surrounding inflammatory changes. Spleen: Normal Adrenals/Urinary Tract: Adrenal glands are unremarkable. Kidneys are normal, without renal calculi, focal lesion, or hydronephrosis. Bladder is unremarkable. Stomach/Bowel: The stomach is massively distended and fluid-filled. There is reflux of fluid into the distal esophagus. There is dilated, fluid-filled small bowel throughout most of the abdomen. There is no clear transition point. The distal ileum is decompressed. The colon is decompressed. There is widespread colonic diverticulosis without acute inflammation. There are large bilateral inguinal hernias, right greater than left. The right inguinal hernia contains the cecum and terminal ileum. Vascular/Lymphatic: There is calcific aortic atherosclerosis. No lymphadenopathy. Reproductive: Prostate is normal. Other: None Musculoskeletal: No acute or significant osseous findings. IMPRESSION: 1. Dilated, fluid-filled small bowel throughout most of the abdomen, consistent with small bowel obstruction. No clear transition point. 2. Massively distended and fluid-filled stomach with reflux of fluid into the distal esophagus, consistent with gastric outlet obstruction. 3. Large bilateral inguinal hernias, right greater than left. The right inguinal hernia contains the cecum and terminal ileum. Aortic Atherosclerosis (ICD10-I70.0). Electronically Signed   By: Ulyses Jarred M.D.   On: 03/11/2022 03:53   DG Chest 1 View  Result Date: 03/11/2022 CLINICAL  DATA:  Cough and ground emesis EXAM: PORTABLE CHEST 1 VIEW COMPARISON:  11/11/2019 FINDINGS: Cardiac shadow is stable. Aortic calcifications are seen. Lungs are well aerated bilaterally. No bony abnormality is seen. IMPRESSION: No active disease. Electronically Signed   By: Inez Catalina M.D.   On: 03/11/2022 01:24    Microbiology: Recent Results (from the past 240 hour(s))  Resp panel by RT-PCR (RSV, Flu A&B, Covid) Anterior Nasal Swab     Status: None   Collection Time: 03/11/22 12:48 AM   Specimen: Anterior Nasal Swab  Result Value Ref Range Status   SARS Coronavirus 2 by RT PCR NEGATIVE NEGATIVE Final    Comment: (NOTE) SARS-CoV-2 target nucleic acids are NOT DETECTED.  The SARS-CoV-2 RNA is generally detectable in upper respiratory specimens during the acute phase of infection. The lowest concentration of SARS-CoV-2 viral copies this assay can detect is 138 copies/mL. A negative result does not preclude SARS-Cov-2 infection and should not be used as the sole basis for treatment or other patient management decisions. A negative result may occur with  improper specimen collection/handling, submission of specimen other than nasopharyngeal swab, presence of viral mutation(s) within the areas targeted  by this assay, and inadequate number of viral copies(<138 copies/mL). A negative result must be combined with clinical observations, patient history, and epidemiological information. The expected result is Negative.  Fact Sheet for Patients:  BloggerCourse.com  Fact Sheet for Healthcare Providers:  SeriousBroker.it  This test is no t yet approved or cleared by the [place] FDA and  has been authorized for detection and/or diagnosis of SARS-CoV-2 by FDA under an Emergency Use Authorization (EUA). This EUA will remain  in effect (meaning this test can be used) for the duration of the COVID-19 declaration under Section 564(b)(1) of the  Act, 21 U.S.C.section 360bbb-3(b)(1), unless the authorization is terminated  or revoked sooner.       Influenza A by PCR NEGATIVE NEGATIVE Final   Influenza B by PCR NEGATIVE NEGATIVE Final    Comment: (NOTE) The Xpert Xpress SARS-CoV-2/FLU/RSV plus assay is intended as an aid in the diagnosis of influenza from Nasopharyngeal swab specimens and should not be used as a sole basis for treatment. Nasal washings and aspirates are unacceptable for Xpert Xpress SARS-CoV-2/FLU/RSV testing.  Fact Sheet for Patients: BloggerCourse.com  Fact Sheet for Healthcare Providers: SeriousBroker.it  This test is not yet approved or cleared by the [place] FDA and has been authorized for detection and/or diagnosis of SARS-CoV-2 by FDA under an Emergency Use Authorization (EUA). This EUA will remain in effect (meaning this test can be used) for the duration of the COVID-19 declaration under Section 564(b)(1) of the Act, 21 U.S.C. section 360bbb-3(b)(1), unless the authorization is terminated or revoked.     Resp Syncytial Virus by PCR NEGATIVE NEGATIVE Final    Comment: (NOTE) Fact Sheet for Patients: BloggerCourse.com  Fact Sheet for Healthcare Providers: SeriousBroker.it  This test is not yet approved or cleared by the [place] FDA and has been authorized for detection and/or diagnosis of SARS-CoV-2 by FDA under an Emergency Use Authorization (EUA). This EUA will remain in effect (meaning this test can be used) for the duration of the COVID-19 declaration under Section 564(b)(1) of the Act, 21 U.S.C. section 360bbb-3(b)(1), unless the authorization is terminated or revoked.  Performed at [facility], [address] [place], [place] [address]      Labs: Basic Metabolic Panel: Recent Labs  Lab 03/11/22 0048 03/11/22 0230 03/11/22 2323 03/13/22 0654  NA 136  --   138 140  K 3.8  --  4.2 3.5  CL 91*  --  96* 107  CO2 31  --  28 23  GLUCOSE 180*  --  126* 100*  BUN 25*  --  43* 36*  CREATININE 1.50*  --  2.97* 1.59*  CALCIUM 9.9  --  8.9 8.4*  MG  --  2.3 2.6*  --    Liver Function Tests: Recent Labs  Lab 03/11/22 0048  AST 26  ALT 19  ALKPHOS 50  BILITOT 0.7  PROT 8.7*  ALBUMIN 5.0   Recent Labs  Lab 03/11/22 0048  LIPASE 41   No results for input(s): "AMMONIA" in the last 168 hours. CBC: Recent Labs  Lab 03/11/22 0048 03/11/22 2323 03/13/22 0654  WBC 9.1 8.9 7.4  NEUTROABS 7.7  --   --   HGB 13.1 11.9* 10.3*  HCT 38.2* 34.9* 31.3*  MCV 87.4 89.0 90.2  PLT 402* 351 312   Cardiac Enzymes: No results for input(s): "CKTOTAL", "CKMB", "CKMBINDEX", "TROPONINI" in the last 168 hours. BNP: BNP (last 3 results) Recent Labs    02/04/22 [phone]  BNP 29.2    ProBNP (last 3 results) No results for input(s): "PROBNP" in the last 8760 hours.  CBG: No results for input(s): "GLUCAP" in the last 168 hours.     Signed:  Silvano Bilis MD.  [facility] 03/13/2022, 11:22 AM

## 2022-03-13 NOTE — Progress Notes (Signed)
Subjective:  CC: Jacob Waters is a 67 y.o. male  Hospital stay day 2,   SBO  HPI: No complaints overnight.  1 more bowel movement yesterday tolerating clears.  ROS:  General: Denies weight loss, weight gain, fatigue, fevers, chills, and night sweats. Heart: Denies chest pain, palpitations, racing heart, irregular heartbeat, leg pain or swelling, and decreased activity tolerance. Respiratory: Denies breathing difficulty, shortness of breath, wheezing, cough, and sputum. GI: Denies change in appetite, heartburn, nausea, vomiting, constipation, diarrhea, and blood in stool. GU: Denies difficulty urinating, pain with urinating, urgency, frequency, blood in urine.   Objective:   Temp:  [98.3 F (36.8 C)-98.9 F (37.2 C)] 98.4 F (36.9 C) (01/05 0918) Pulse Rate:  [76-92] 77 (01/05 0918) Resp:  [16-20] 18 (01/05 0918) BP: (141-161)/(72-88) 161/87 (01/05 0918) SpO2:  [94 %-100 %] 100 % (01/05 0918)     Height: 5\' 5"  (165.1 cm) Weight: 81.6 kg BMI (Calculated): 29.95   Intake/Output this shift:   Intake/Output Summary (Last 24 hours) at 03/13/2022 1326 Last data filed at 03/13/2022 1101 Gross per 24 hour  Intake 4997 ml  Output --  Net 4997 ml    Constitutional :  alert, cooperative, appears stated age, and no distress  Respiratory:  clear to auscultation bilaterally  Cardiovascular:  regular rate and rhythm  Gastrointestinal: soft, non-tender; bowel sounds normal; no masses,  no organomegaly  Skin: Cool and moist.   Psychiatric: Normal affect, non-agitated, not confused       LABS:     Latest Ref Rng & Units 03/13/2022    6:54 AM 03/11/2022   11:23 PM 03/11/2022   12:48 AM  CMP  Glucose 70 - 99 mg/dL 100  126  180   BUN 8 - 23 mg/dL 36  43  25   Creatinine 0.61 - 1.24 mg/dL 1.59  2.97  1.50   Sodium 135 - 145 mmol/L 140  138  136   Potassium 3.5 - 5.1 mmol/L 3.5  4.2  3.8   Chloride 98 - 111 mmol/L 107  96  91   CO2 22 - 32 mmol/L 23  28  31    Calcium 8.9 - 10.3 mg/dL 8.4   8.9  9.9   Total Protein 6.5 - 8.1 g/dL   8.7   Total Bilirubin 0.3 - 1.2 mg/dL   0.7   Alkaline Phos 38 - 126 U/L   50   AST 15 - 41 U/L   26   ALT 0 - 44 U/L   19       Latest Ref Rng & Units 03/13/2022    6:54 AM 03/11/2022   11:23 PM 03/11/2022   12:48 AM  CBC  WBC 4.0 - 10.5 K/uL 7.4  8.9  9.1   Hemoglobin 13.0 - 17.0 g/dL 10.3  11.9  13.1   Hematocrit 39.0 - 52.0 % 31.3  34.9  38.2   Platelets 150 - 400 K/uL 312  351  402     RADS: Narrative & Impression  CLINICAL DATA:  Small bowel obstruction.   EXAM: ABDOMEN - 1 VIEW   COMPARISON:  March 11, 2022.   FINDINGS: No abnormal bowel dilatation is noted. Nasogastric tube tip is seen in proximal stomach. Residual contrast is seen throughout the nondilated colon and rectum.   IMPRESSION: No abnormal bowel dilatation.     Electronically Signed   By: Marijo Conception M.D.   On: 03/12/2022 14:47     Assessment:   Resolving.  Tolerating clears and having bowel movements.  Advance diet as tolerated okay to discharge once tolerating regular diet.  Follow-up in office for possible discussion of inguinal hernia repair since cannot definitively exclude this is contributing to the obstruction episode versus.  labs/images/medications/previous chart entries reviewed personally and relevant changes/updates noted above.

## 2022-03-25 ENCOUNTER — Other Ambulatory Visit: Payer: Self-pay | Admitting: Internal Medicine

## 2022-03-25 DIAGNOSIS — Z7901 Long term (current) use of anticoagulants: Secondary | ICD-10-CM

## 2022-03-25 DIAGNOSIS — I4892 Unspecified atrial flutter: Secondary | ICD-10-CM

## 2022-03-25 NOTE — Telephone Encounter (Signed)
Prescription refill request for Eliquis received. Indication:afib Last office visit:11/23 Scr:1.5 Age: 67 Weight:81.6  kg  Prescription refilled

## 2022-03-25 NOTE — Telephone Encounter (Signed)
Please review

## 2022-03-26 ENCOUNTER — Telehealth: Payer: Self-pay | Admitting: Internal Medicine

## 2022-03-26 MED ORDER — ENTRESTO 97-103 MG PO TABS: 1.0000 | ORAL_TABLET | Freq: Two times a day (BID) | ORAL | 0 refills | Status: DC

## 2022-03-26 NOTE — Telephone Encounter (Signed)
*  STAT* If patient is at the pharmacy, call can be transferred to refill team.   1. Which medications need to be refilled? (please list name of each medication and dose if known)   sacubitril-valsartan (ENTRESTO) 97-103 MG    2. Which pharmacy/location (including street and city if local pharmacy) is medication to be sent to?   [facility] - [place] ([address]), [place] - [address]    3. Do they need a 30 day or 90 day supply? [place]

## 2022-03-26 NOTE — Telephone Encounter (Signed)
Requested Prescriptions   Signed Prescriptions Disp Refills   sacubitril-valsartan (ENTRESTO) 97-103 MG 180 tablet 0    Sig: Take 1 tablet by mouth 2 (two) times daily.    Authorizing Provider: Minna Merritts    Ordering User: Britt Bottom

## 2022-03-27 ENCOUNTER — Ambulatory Visit (INDEPENDENT_AMBULATORY_CARE_PROVIDER_SITE_OTHER): Payer: Medicare Other | Admitting: Cardiology

## 2022-03-27 ENCOUNTER — Ambulatory Visit: Payer: Medicare Other | Attending: Internal Medicine

## 2022-03-27 ENCOUNTER — Encounter: Payer: Self-pay | Admitting: Cardiology

## 2022-03-27 VITALS — BP 116/68 | HR 102 | Ht 65.0 in | Wt 180.0 lb

## 2022-03-27 DIAGNOSIS — I10 Essential (primary) hypertension: Secondary | ICD-10-CM | POA: Diagnosis present

## 2022-03-27 DIAGNOSIS — R0601 Orthopnea: Secondary | ICD-10-CM | POA: Diagnosis present

## 2022-03-27 DIAGNOSIS — I5030 Unspecified diastolic (congestive) heart failure: Secondary | ICD-10-CM | POA: Diagnosis present

## 2022-03-27 DIAGNOSIS — I4891 Unspecified atrial fibrillation: Secondary | ICD-10-CM | POA: Insufficient documentation

## 2022-03-27 MED ORDER — METOPROLOL TARTRATE 50 MG PO TABS: 50.0000 mg | ORAL_TABLET | Freq: Two times a day (BID) | ORAL | 3 refills | Status: DC

## 2022-03-27 MED ORDER — METOPROLOL TARTRATE 50 MG PO TABS
50.0000 mg | ORAL_TABLET | Freq: Once | ORAL | Status: AC
  Administered 2022-03-27: 50 mg via ORAL

## 2022-03-27 MED ORDER — ENTRESTO 97-103 MG PO TABS: 1.0000 | ORAL_TABLET | Freq: Two times a day (BID) | ORAL | 2 refills | Status: DC

## 2022-03-27 NOTE — Progress Notes (Signed)
Cardiology Office Note:    Date:  03/27/2022   ID:  [name], DOB 1955-10-20, MRN [id number]  PCP:  Patient, No Pcp Per    [facility]Care Providers Cardiologist:  Yvonne Kendall, MD     Referring MD: Yvonne Kendall, MD   Chief Complaint  Patient presents with   Advice Only    Tachycardia     History of Present Illness:    Jacob Waters is a 67 y.o. male with a hx of atrial flutter, HFpEF, hypertension presenting for tachycardia.  Patient presents today for scheduled echocardiogram, heart rate is noted to be elevated during echo.  EKG obtained showed atrial fibrillation with rapid ventricular response heart rate 149.  He denies chest pain, palpitations, shortness of breath.  Denies dizziness or syncope.  He states running out of Entresto 5 days ago, compliant with all medications including Eliquis as prescribed.  Prior echo 06/2020 EF 60 to 65%  Past Medical History:  Diagnosis Date   Alcohol abuse    Atrial flutter (HCC)    Chronic heart failure with preserved ejection fraction (HFpEF) (HCC)    Hypertension     Past Surgical History:  Procedure Laterality Date   NO PAST SURGERIES      Current Medications: Current Meds  Medication Sig   amLODipine (NORVASC) 10 MG tablet Take 1 tablet (10 mg total) by mouth daily.   apixaban (ELIQUIS) 5 MG TABS tablet Take 1 tablet by mouth twice daily   hydrochlorothiazide (HYDRODIURIL) 25 MG tablet Take 1 tablet (25 mg total) by mouth daily.   isosorbide mononitrate (IMDUR) 30 MG 24 hr tablet Take 1 tablet (30 mg total) by mouth daily.   metoprolol tartrate (LOPRESSOR) 50 MG tablet Take 1 tablet (50 mg total) by mouth 2 (two) times daily.   [DISCONTINUED] sacubitril-valsartan (ENTRESTO) 97-103 MG Take 1 tablet by mouth 2 (two) times daily.     Allergies:   Patient has no known allergies.   Social History   Socioeconomic History   Marital status: Single    Spouse name: Not on file   Number of children: Not on file    Years of education: Not on file   Highest education level: Not on file  Occupational History   Not on file  Tobacco Use   Smoking status: Never   Smokeless tobacco: Current    Types: Chew    Last attempt to quit: 05/2018  Vaping Use   Vaping Use: Never used  Substance and Sexual Activity   Alcohol use: Not Currently    Alcohol/week: 24.0 standard drinks of alcohol    Types: 24 Cans of beer per week   Drug use: Never   Sexual activity: Not on file  Other Topics Concern   Not on file  Social History Narrative   Not on file   Social Determinants of Health   Financial Resource Strain: Not on file  Food Insecurity: Food Insecurity Present (03/11/2022)   Hunger Vital Sign    Worried About Running Out of Food in the Last Year: Sometimes true    Ran Out of Food in the Last Year: Never true  Transportation Needs: No Transportation Needs (03/11/2022)   PRAPARE - Administrator, Civil Service (Medical): No    Lack of Transportation (Non-Medical): No  Physical Activity: Not on file  Stress: Not on file  Social Connections: Not on file     Family History: The patient's family history includes Cancer in his father and mother;  Hypertension in his father, mother, and sister.  ROS:   Please see the history of present illness.     All other systems reviewed and are negative.  EKGs/Labs/Other Studies Reviewed:    The following studies were reviewed today:   EKG:  EKG is  ordered today.  The ekg ordered today demonstrates atrial fibrillation with rapid ventricular response heart rate 149  Recent Labs: 02/04/2022: B Natriuretic Peptide 29.2 03/11/2022: ALT 19; Magnesium 2.6 03/13/2022: BUN 36; Creatinine, Ser 1.59; Hemoglobin 10.3; Platelets 312; Potassium 3.5; Sodium 140  Recent Lipid Panel No results found for: "CHOL", "TRIG", "HDL", "CHOLHDL", "VLDL", "LDLCALC", "LDLDIRECT"   Risk Assessment/Calculations:             Physical Exam:    VS:  BP 116/68 (BP  Location: Left Arm, Patient Position: Sitting, Cuff Size: Normal)   Pulse (!) 102   Ht 5\' 5"  (1.651 m)   Wt 180 lb (81.6 kg)   SpO2 98%   BMI 29.95 kg/m     Wt Readings from Last 3 Encounters:  03/27/22 180 lb (81.6 kg)  03/11/22 180 lb (81.6 kg)  02/04/22 180 lb (81.6 kg)     GEN:  Well nourished, well developed in no acute distress HEENT: Normal NECK: No JVD; No carotid bruits CARDIAC: Irregular irregular, tachycardic RESPIRATORY:  Clear to auscultation without rales, wheezing or rhonchi  ABDOMEN: Soft, non-tender, non-distended MUSCULOSKELETAL:  No edema; No deformity  SKIN: Warm and dry NEUROLOGIC:  Alert and oriented x 3 PSYCHIATRIC:  Normal affect   ASSESSMENT:    1. Atrial fibrillation with RVR ([place])   2. Primary hypertension    PLAN:    In order of problems listed above:  Atrial fibrillation with rapid ventricular response.  Initial Heart rate 149, patient is clinically asymptomatic.  Compliant with Eliquis as prescribed.  Was given Lopressor 50 mg x 1 in the office today, heart rate improved to 125 bpm after 20 minutes.  Another Lopressor 50 mg x 1 given, heart rate is improved to 102 .  Will Start Lopressor 50 mg twice daily to take p.m. dose later today.  Follow-up in 1 to 2 weeks with APP or Dr. Saunders Revel.  Consider DC cardioversion if patient still in atrial fibrillation.  Continue Eliquis. Hypertension, BP controlled.  Continue Norvasc, HCTZ.  Lopressor as above.  Has not taken Entresto x 5 days.  BP controlled.  Continue to hold Entresto to prevent hypotension with addition of Lopressor.  Follow-up in 1 to 2 weeks with APP or Dr. Saunders Revel.       Medication Adjustments/Labs and Tests Ordered: Current medicines are reviewed at length with the patient today.  Concerns regarding medicines are outlined above.  Orders Placed This Encounter  Procedures   EKG 12-Lead   Meds ordered this encounter  Medications   sacubitril-valsartan (ENTRESTO) 97-103 MG    Sig: Take 1  tablet by mouth 2 (two) times daily.    Dispense:  180 tablet    Refill:  2   metoprolol tartrate (LOPRESSOR) tablet 50 mg   metoprolol tartrate (LOPRESSOR) tablet 50 mg   metoprolol tartrate (LOPRESSOR) 50 MG tablet    Sig: Take 1 tablet (50 mg total) by mouth 2 (two) times daily.    Dispense:  180 tablet    Refill:  3    Patient Instructions  Medication Instructions:   START Lopressor - take one tablet (50 mg) by mouth twice a day.   *If you need a refill on  your cardiac medications before your next appointment, please call your pharmacy*   Lab Work:  None Ordered  If you have labs (blood work) drawn today and your tests are completely normal, you will receive your results only by: [place] (if you have MyChart) OR A paper copy in the mail If you have any lab test that is abnormal or we need to change your treatment, we will call you to review the results.   Testing/Procedures:  None Ordered   Follow-Up: At [facility], you and your health needs are our priority.  As part of our continuing mission to provide you with exceptional heart care, we have created designated Provider Care Teams.  These Care Teams include your primary Cardiologist (physician) and Advanced Practice Providers (APPs -  Physician Assistants and Nurse Practitioners) who all work together to provide you with the care you need, when you need it.  We recommend signing up for the patient portal called "MyChart".  Sign up information is provided on this After Visit Summary.  MyChart is used to connect with patients for Virtual Visits (Telemedicine).  Patients are able to view lab/test results, encounter notes, upcoming appointments, etc.  Non-urgent messages can be sent to your provider as well.   To learn more about what you can do with MyChart, go to [email or web address].    Your next appointment:   2 week(s)  Provider:   You may see Nelva Bush, MD or one of the following  Advanced Practice Providers on your designated Care Team:   Murray Hodgkins, NP Christell Faith, PA-C Cadence Kathlen Mody, PA-C Gerrie Nordmann, NP   Signed, Kate Sable, MD  03/27/2022 2:22 PM    [place]

## 2022-03-27 NOTE — Patient Instructions (Signed)
Medication Instructions:   START Lopressor - take one tablet (50 mg) by mouth twice a day.   *If you need a refill on your cardiac medications before your next appointment, please call your pharmacy*   Lab Work:  None Ordered  If you have labs (blood work) drawn today and your tests are completely normal, you will receive your results only by: [place] (if you have MyChart) OR A paper copy in the mail If you have any lab test that is abnormal or we need to change your treatment, we will call you to review the results.   Testing/Procedures:  None Ordered   Follow-Up: At [facility], you and your health needs are our priority.  As part of our continuing mission to provide you with exceptional heart care, we have created designated Provider Care Teams.  These Care Teams include your primary Cardiologist (physician) and Advanced Practice Providers (APPs -  Physician Assistants and Nurse Practitioners) who all work together to provide you with the care you need, when you need it.  We recommend signing up for the patient portal called "MyChart".  Sign up information is provided on this After Visit Summary.  MyChart is used to connect with patients for Virtual Visits (Telemedicine).  Patients are able to view lab/test results, encounter notes, upcoming appointments, etc.  Non-urgent messages can be sent to your provider as well.   To learn more about what you can do with MyChart, go to [email or web address].    Your next appointment:   2 week(s)  Provider:   You may see Nelva Bush, MD or one of the following Advanced Practice Providers on your designated Care Team:   Murray Hodgkins, NP Christell Faith, PA-C Cadence Kathlen Mody, PA-C Gerrie Nordmann, NP

## 2022-03-27 NOTE — Progress Notes (Unsigned)
   Patient ID: Jacob Waters, male    DOB: Nov 09, 1955, 67 y.o.   MRN: [id number]  Patient arrived for echocardiogram and his heart rate was seen to be in the 130-165bpm range. That rate was observed for at least 5 min.He was also in afib/ aflutter. He denied symptoms.  The echo was canceled and he was moved to an exam room to be seen by the DOD.      Review of Systems    Physical Exam

## 2022-04-13 ENCOUNTER — Encounter: Payer: Self-pay | Admitting: Medical

## 2022-04-13 ENCOUNTER — Ambulatory Visit: Payer: Medicare Other | Attending: Internal Medicine | Admitting: Medical

## 2022-04-13 ENCOUNTER — Other Ambulatory Visit
Admission: RE | Admit: 2022-04-13 | Discharge: 2022-04-13 | Disposition: A | Discharge status: Home / Self Care | Payer: Medicare Other | Attending: Medical | Admitting: Medical

## 2022-04-13 VITALS — BP 142/80 | HR 58 | Ht 65.0 in | Wt 179.6 lb

## 2022-04-13 DIAGNOSIS — I5032 Chronic diastolic (congestive) heart failure: Secondary | ICD-10-CM | POA: Insufficient documentation

## 2022-04-13 DIAGNOSIS — R0601 Orthopnea: Secondary | ICD-10-CM

## 2022-04-13 DIAGNOSIS — I10 Essential (primary) hypertension: Secondary | ICD-10-CM | POA: Diagnosis present

## 2022-04-13 DIAGNOSIS — I4891 Unspecified atrial fibrillation: Secondary | ICD-10-CM | POA: Diagnosis present

## 2022-04-13 DIAGNOSIS — Z720 Tobacco use: Secondary | ICD-10-CM | POA: Insufficient documentation

## 2022-04-13 LAB — BASIC METABOLIC PANEL
Anion gap: 8 (ref 5–15)
BUN: 22 mg/dL (ref 8–23)
CO2: 28 mmol/L (ref 22–32)
Calcium: 9.7 mg/dL (ref 8.9–10.3)
Chloride: 104 mmol/L (ref 98–111)
Creatinine, Ser: 1.55 mg/dL — ABNORMAL HIGH (ref 0.61–1.24)
GFR, Estimated: 49 mL/min — ABNORMAL LOW (ref >60)
Glucose, Bld: 107 mg/dL — ABNORMAL HIGH (ref 70–99)
Potassium: 4.1 mmol/L (ref 3.5–5.1)
Sodium: 140 mmol/L (ref 135–145)

## 2022-04-13 LAB — CBC
HCT: 38.5 % — ABNORMAL LOW (ref 39.0–52.0)
Hemoglobin: 13.3 g/dL (ref 13.0–17.0)
MCH: 30 pg (ref 26.0–34.0)
MCHC: 34.5 g/dL (ref 30.0–36.0)
MCV: 86.9 fL (ref 80.0–100.0)
Platelets: 307 10*3/uL (ref 150–400)
RBC: 4.43 MIL/uL (ref 4.22–5.81)
RDW: 12.3 % (ref 11.5–15.5)
WBC: 5.1 10*3/uL (ref 4.0–10.5)
nRBC: 0 % (ref 0.0–0.2)

## 2022-04-13 MED ORDER — ENTRESTO 24-26 MG PO TABS: 1.0000 | ORAL_TABLET | Freq: Two times a day (BID) | ORAL | 1 refills | Status: DC

## 2022-04-13 NOTE — Progress Notes (Signed)
Cardiology Office Note:    Date:  04/13/2022   ID:  Jacob Waters, DOB [birth year], MRN [id number]  PCP:  Patient, No Pcp Per  [place] Cardiologist:  Nelva Bush, MD  [place] Electrophysiologist:  None   Referring MD: No ref. provider found   Chief Complaint: 2 week follow-up  History of Present Illness:    Jacob Waters is a 67 y.o. male with a hx of paroxysmal aflutter, HFpEF, HTN who presents for afib follow-up.   Echo in 06/2020 showed LVEF 60-65%.   He was seen 03/27/22 for his scheduled echo and was found to be in rapid afib with RVR. Echo was postponed and he was seen in the office that day.He was given Lopressor in the office with improvement and started on Lopressor 50mg  MD.  Today, the patient is on SB with a heart rate of 58bpm. He denies bleeding issue with Eliquis. He denies chest pain, SOB, LLE, orthopnea or pnd. BP is mildly elevated. He is off Entresto and needs refills.   Past Medical History:  Diagnosis Date   Alcohol abuse    Atrial flutter ([place])    Chronic heart failure with preserved ejection fraction (HFpEF) (HCC)    Hypertension     Past Surgical History:  Procedure Laterality Date   NO PAST SURGERIES      Current Medications: Current Meds  Medication Sig   amLODipine (NORVASC) 10 MG tablet Take 1 tablet (10 mg total) by mouth daily.   apixaban (ELIQUIS) 5 MG TABS tablet Take 1 tablet by mouth twice daily   hydrochlorothiazide (HYDRODIURIL) 25 MG tablet Take 1 tablet (25 mg total) by mouth daily.   isosorbide mononitrate (IMDUR) 30 MG 24 hr tablet Take 1 tablet (30 mg total) by mouth daily.   metoprolol tartrate (LOPRESSOR) 50 MG tablet Take 1 tablet (50 mg total) by mouth 2 (two) times daily.   [DISCONTINUED] sacubitril-valsartan (ENTRESTO) 24-26 MG Take 1 tablet by mouth 2 (two) times daily.   [DISCONTINUED] sacubitril-valsartan (ENTRESTO) 97-103 MG Take 1 tablet by mouth 2 (two) times daily.     Allergies:   Patient has no known  allergies.   Social History   Socioeconomic History   Marital status: Single    Spouse name: Not on file   Number of children: Not on file   Years of education: Not on file   Highest education level: Not on file  Occupational History   Not on file  Tobacco Use   Smoking status: Never   Smokeless tobacco: Current    Types: Chew    Last attempt to quit: 05/2018  Vaping Use   Vaping Use: Never used  Substance and Sexual Activity   Alcohol use: Not Currently    Alcohol/week: 24.0 standard drinks of alcohol    Types: 24 Cans of beer per week   Drug use: Never   Sexual activity: Not on file  Other Topics Concern   Not on file  Social History Narrative   Not on file   Social Determinants of Health   Financial Resource Strain: Not on file  Food Insecurity: Food Insecurity Present (03/11/2022)   Hunger Vital Sign    Worried About Running Out of Food in the Last Year: Sometimes true    Ran Out of Food in the Last Year: Never true  Transportation Needs: No Transportation Needs (03/11/2022)   PRAPARE - Hydrologist (Medical): No    Lack of Transportation (Non-Medical): No  Physical Activity: Not on file  Stress: Not on file  Social Connections: Not on file     Family History: The patient's family history includes Cancer in his father and mother; Hypertension in his father, mother, and sister.  ROS:   Please see the history of present illness.     All other systems reviewed and are negative.  EKGs/Labs/Other Studies Reviewed:    The following studies were reviewed today:  Echo 06/2020 1. Left ventricular ejection fraction, by estimation, is 60 to 65%. The  left ventricle has normal function. The left ventricle has no regional  wall motion abnormalities. There is mild left ventricular hypertrophy.  Left ventricular diastolic parameters  were normal.   2. Right ventricular systolic function is normal. The right ventricular  size is normal. There  is normal pulmonary artery systolic pressure.   3. Left atrial size was mildly dilated.   4. The mitral valve is normal in structure. No evidence of mitral valve  regurgitation.   5. The aortic valve is tricuspid. Aortic valve regurgitation is not  visualized. Mild aortic valve sclerosis is present, with no evidence of  aortic valve stenosis.   6. The inferior vena cava is normal in size with greater than 50%  respiratory variability, suggesting right atrial pressure of 3 mmHg.   EKG:  EKG is ordered today.  The ekg ordered today demonstrates SB 58bpm, nonspecific T wave changes  Recent Labs: 02/04/2022: B Natriuretic Peptide 29.2 03/11/2022: ALT 19; Magnesium 2.6 03/13/2022: BUN 36; Creatinine, Ser 1.59; Hemoglobin 10.3; Platelets 312; Potassium 3.5; Sodium 140  Recent Lipid Panel No results found for: "CHOL", "TRIG", "HDL", "CHOLHDL", "VLDL", "LDLCALC", "LDLDIRECT"   Physical Exam:    VS:  BP (!) 142/80 (BP Location: Left Arm, Patient Position: Sitting, Cuff Size: Normal)   Pulse (!) 58   Ht 5\' 5"  (1.651 m)   Wt 179 lb 9.6 oz (81.5 kg)   SpO2 98%   BMI 29.89 kg/m     Wt Readings from Last 3 Encounters:  04/13/22 179 lb 9.6 oz (81.5 kg)  03/27/22 180 lb (81.6 kg)  03/11/22 180 lb (81.6 kg)     GEN:  Well nourished, well developed in no acute distress HEENT: Normal NECK: No JVD; No carotid bruits LYMPHATICS: No lymphadenopathy CARDIAC: RRR, no murmurs, rubs, gallops RESPIRATORY:  Clear to auscultation without rales, wheezing or rhonchi  ABDOMEN: Soft, non-tender, non-distended MUSCULOSKELETAL:  No edema; No deformity  SKIN: Warm and dry NEUROLOGIC:  Alert and oriented x 3 PSYCHIATRIC:  Normal affect   ASSESSMENT:    1. Atrial fibrillation with RVR ([place])   2. Chronic diastolic heart failure ([place])   3. Essential hypertension   4. Tobacco use    PLAN:    In order of problems listed above:  Paroxysmal Afib The patient is back in normal rhythm today with a heart  rate of 58bpm. Continue Lopressor 50mg  BID for rate control. He reports compliance with Eliquis 5mg  BID. I will check a CBC. I will re-schedule the echocardiogram.  HFpEF The patient is euvolemic on exam today. Re-schedule the echo as above. Given mildly elevated BP, I will restart Entresto at 24-26mg BID. BMET in 2 weeks. Continue Lopressor.   HTN BP mildly elevated, restart Entresto as above. Continue amlodipine 10mg  daily, Lopressor 50mg  BID, Imdur 30mg  daily, HCTZ 25mg  daily.   Tobacco use He still chews tobacco, cessation advised.  Disposition: Follow up in 1 month(s) with MD/APP    Signed, Alexandr Yaworski Ninfa Meeker,  PA-C  04/13/2022 11:11 AM    [facility]

## 2022-04-13 NOTE — Addendum Note (Signed)
Addended by: Raelene Bott, Elleigh Cassetta L on: 04/13/2022 11:37 AM   Modules accepted: Orders

## 2022-04-13 NOTE — Patient Instructions (Addendum)
Medication Instructions:  RESTART Entresto 24-26 mg by mouth twice a day.  *If you need a refill on your cardiac medications before your next appointment, please call your pharmacy*   Lab Work: CBC today BMP to be drawn in two weeks - Please go to the [facility]. You will check in at the front desk to the right as you walk into the atrium. Valet Parking is offered if needed. - No appointment needed. You may go any day between 7 am and 6 pm.  If you have labs (blood work) drawn today and your tests are completely normal, you will receive your results only by: [place] (if you have MyChart) OR A paper copy in the mail If you have any lab test that is abnormal or we need to change your treatment, we will call you to review the results.   Testing/Procedures: Please reschedule echocardiogram.  Follow-Up: At [facility], you and your health needs are our priority.  As part of our continuing mission to provide you with exceptional heart care, we have created designated Provider Care Teams.  These Care Teams include your primary Cardiologist (physician) and Advanced Practice Providers (APPs -  Physician Assistants and Nurse Practitioners) who all work together to provide you with the care you need, when you need it.  We recommend signing up for the patient portal called "MyChart".  Sign up information is provided on this After Visit Summary.  MyChart is used to connect with patients for Virtual Visits (Telemedicine).  Patients are able to view lab/test results, encounter notes, upcoming appointments, etc.  Non-urgent messages can be sent to your provider as well.   To learn more about what you can do with MyChart, go to [email or web address].    Your next appointment:   1 month(s)  Provider:   You may see Nelva Bush, MD or one of the following Advanced Practice Providers on your designated Care Team:   Murray Hodgkins, NP Christell Faith, PA-C Cadence Kathlen Mody,  PA-C Gerrie Nordmann, NP

## 2022-04-15 ENCOUNTER — Telehealth: Payer: Self-pay

## 2022-04-15 IMAGING — CT CT HEAD W/O CM
3 series · 16 of 47 positions shown, 19 images · non-contrast
Comparison: None.

CLINICAL DATA: Nausea vomiting

EXAM:
CT HEAD WITHOUT CONTRAST
TECHNIQUE: Contiguous axial images were obtained from the base of the skull
through the vertex without intravenous contrast.

[Series 2: head wo · axial · 0.48mm/px · z∈[-149,-9]mm · 10 of 34 slices shown, 13 images]
[im 3/34  brain]
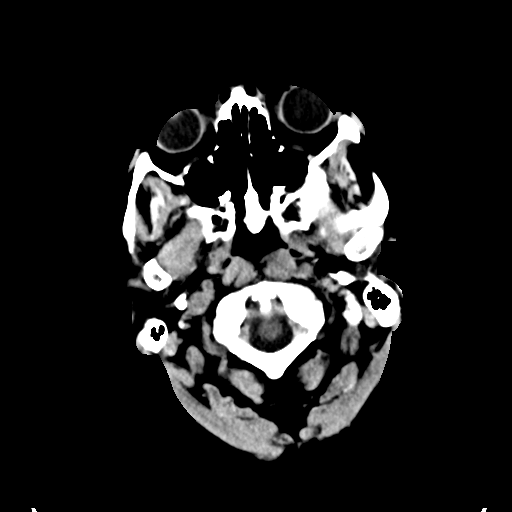
[im 3/34  bone]
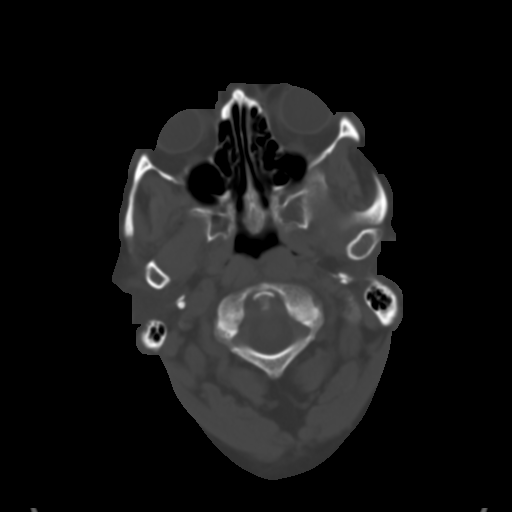
[im 6/34  brain]
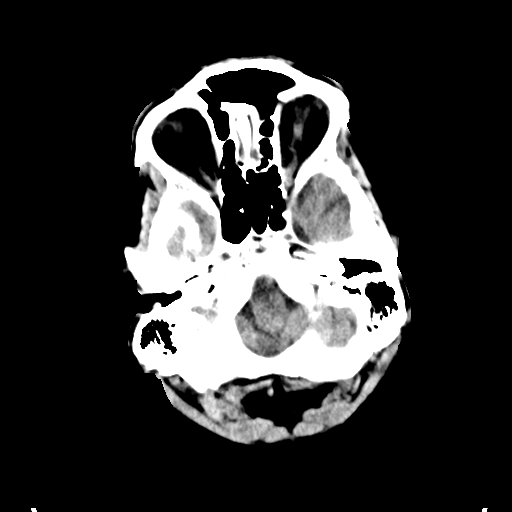
[im 10/34  brain]
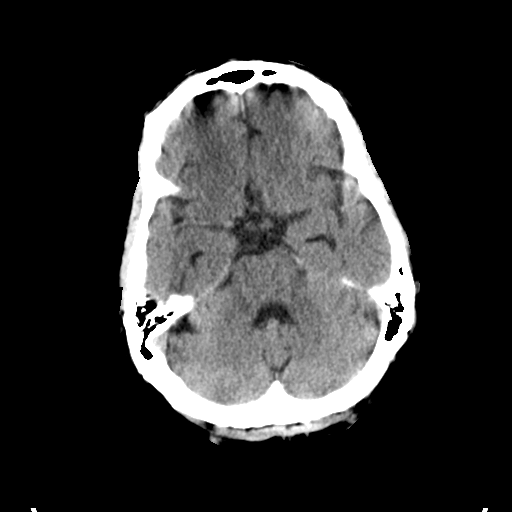
[im 12/34  brain]
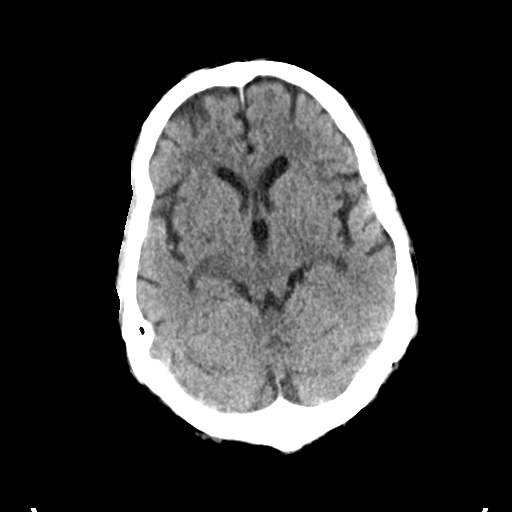
[im 15/34  brain]
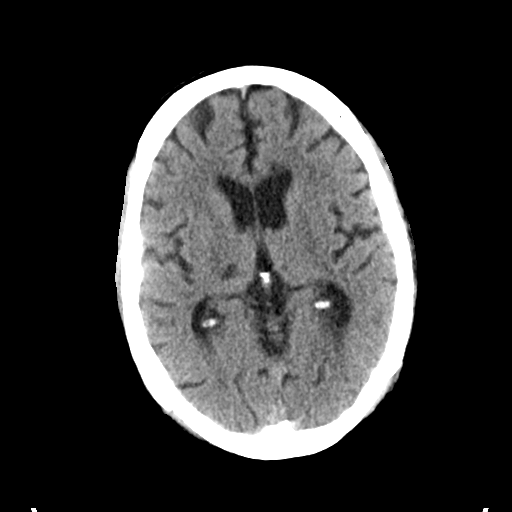
[im 15/34  bone]
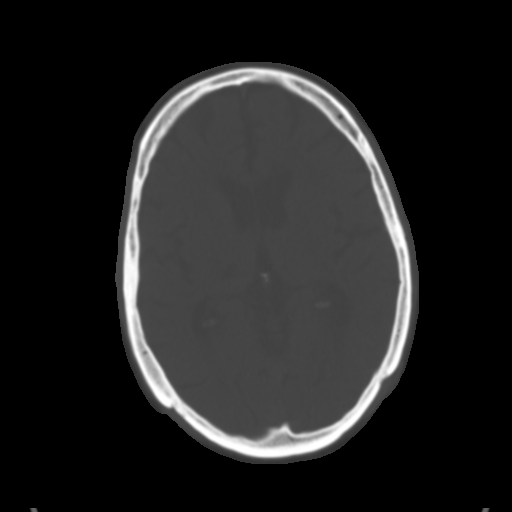
[im 19/34  brain]
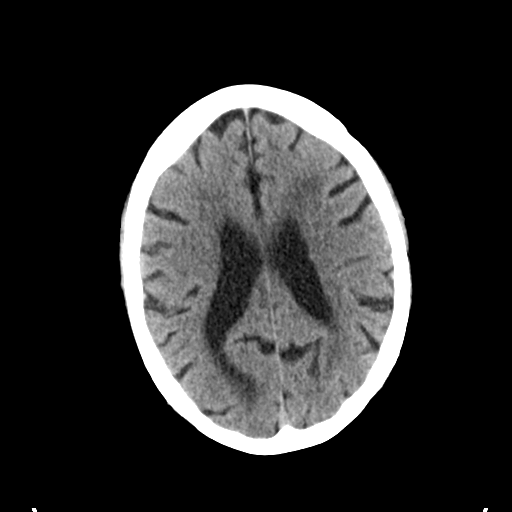
[im 22/34  brain]
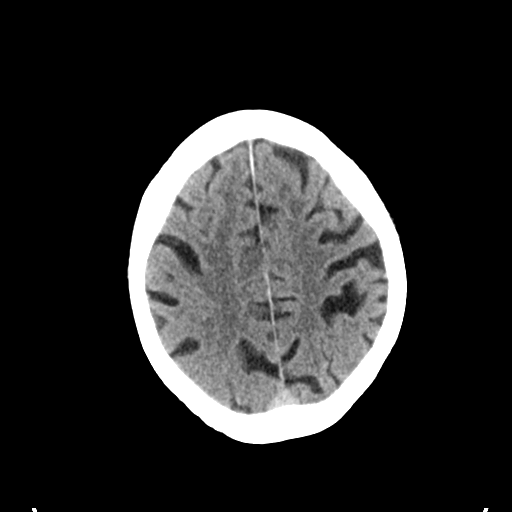
[im 26/34  brain]
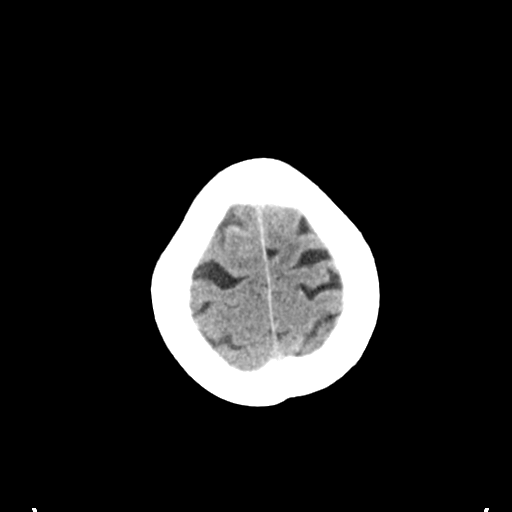
[im 28/34  brain]
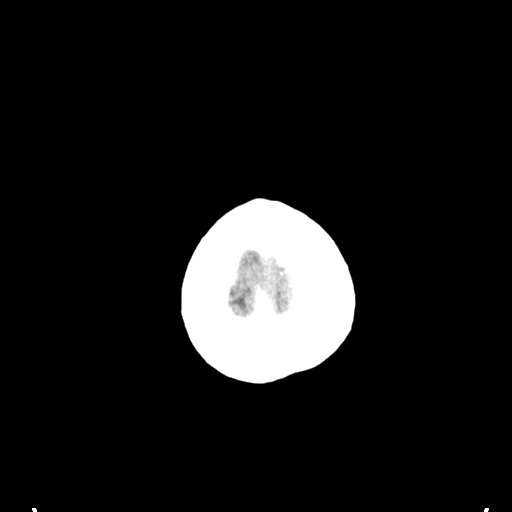
[im 28/34  bone]
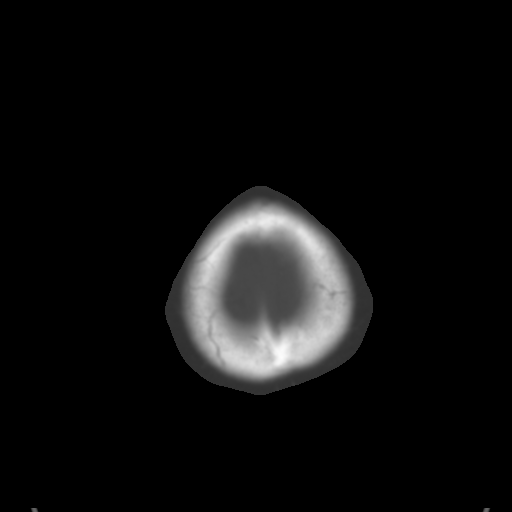
[im 31/34  brain]
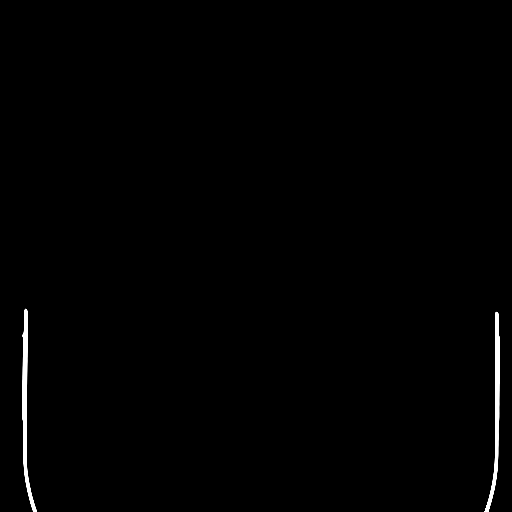

[Series 4: coronal soft tissue · coronal · 0.34mm/px · 3 of 74 slices shown]
[im 25/74  brain]
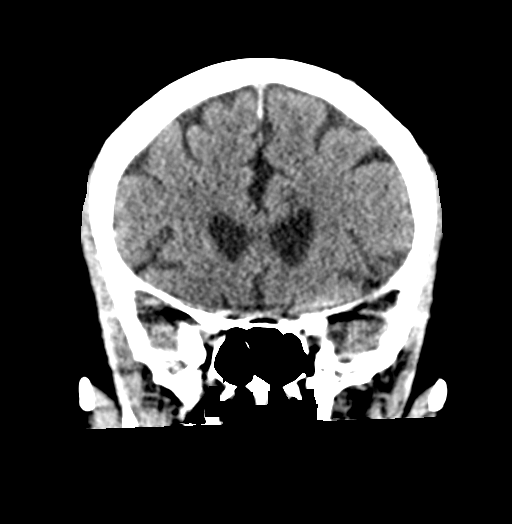
[im 33/74  brain]
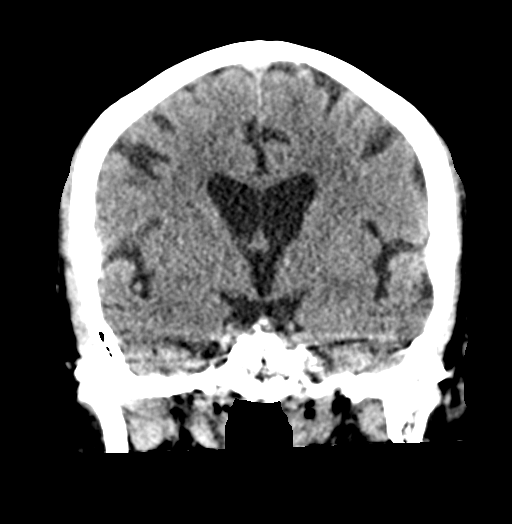
[im 41/74  brain]
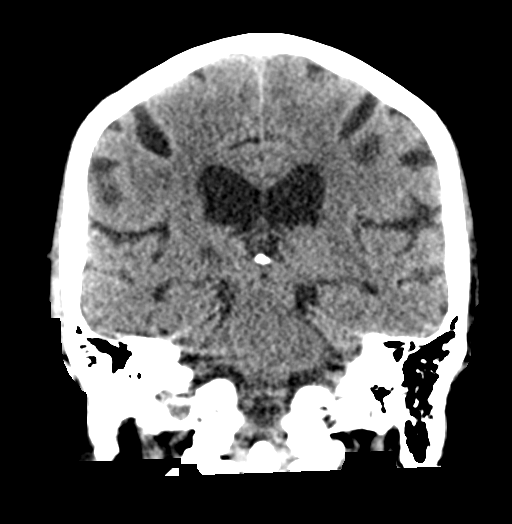

[Series 5: sagittal soft tissue · sagittal · 0.35mm/px · 3 of 59 slices shown]
[im 20/59  brain]
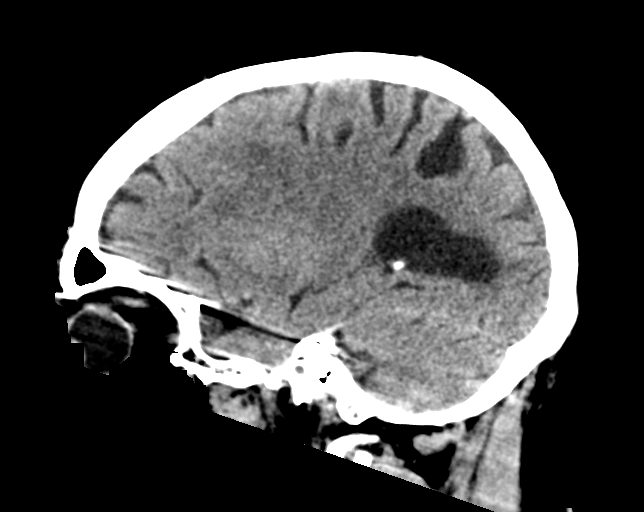
[im 30/59  brain]
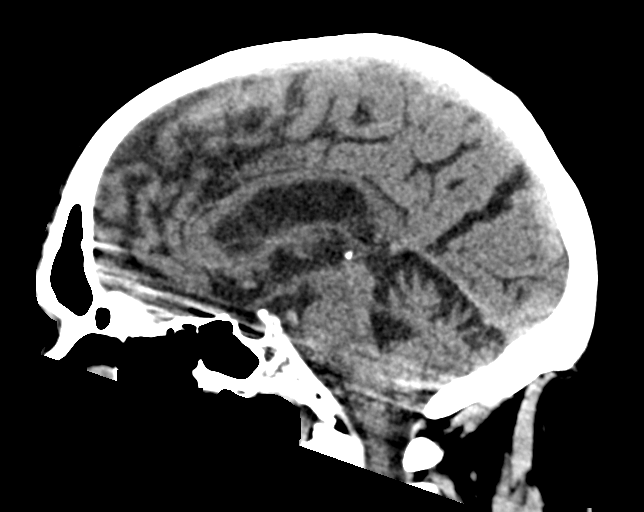
[im 39/59  brain]
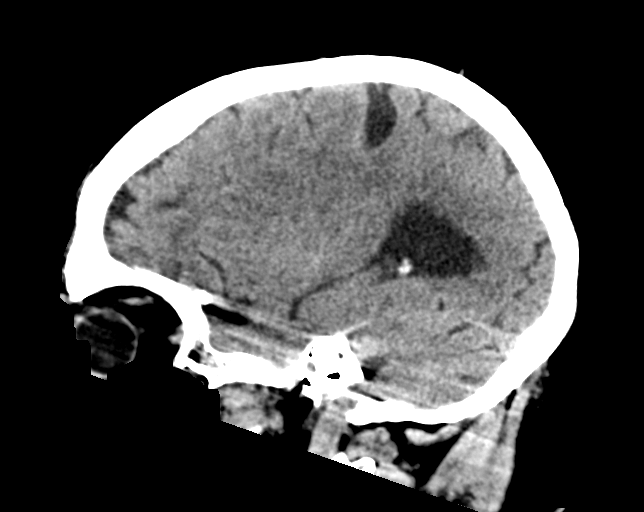

[16 of 47 positions shown; findings below may reference images not displayed]

FINDINGS: Brain: No evidence of acute territorial infarction, hemorrhage,
hydrocephalus,extra-axial collection or mass lesion/mass effect.
There is dilatation the ventricles and sulci consistent with
age-related atrophy. Low-attenuation changes in the deep white
matter consistent with small vessel ischemia. Prior lacunar infarct
involving the right thalamus

Vascular: No hyperdense vessel or unexpected calcification.

Skull: The skull is intact. No fracture or focal lesion identified.

Sinuses/Orbits: The visualized paranasal sinuses and mastoid air
cells are clear. The orbits and globes intact.

Other: None
IMPRESSION: No acute intracranial abnormality.

Findings consistent with mild age related atrophy and chronic small
vessel ischemia

## 2022-04-15 IMAGING — DX DG CHEST 1V PORT
1 series · 1 of 1 positions shown · non-contrast
Comparison: June 20, 2018.

CLINICAL DATA: Shortness of breath.

EXAM:
PORTABLE CHEST 1 VIEW

[chest ap]
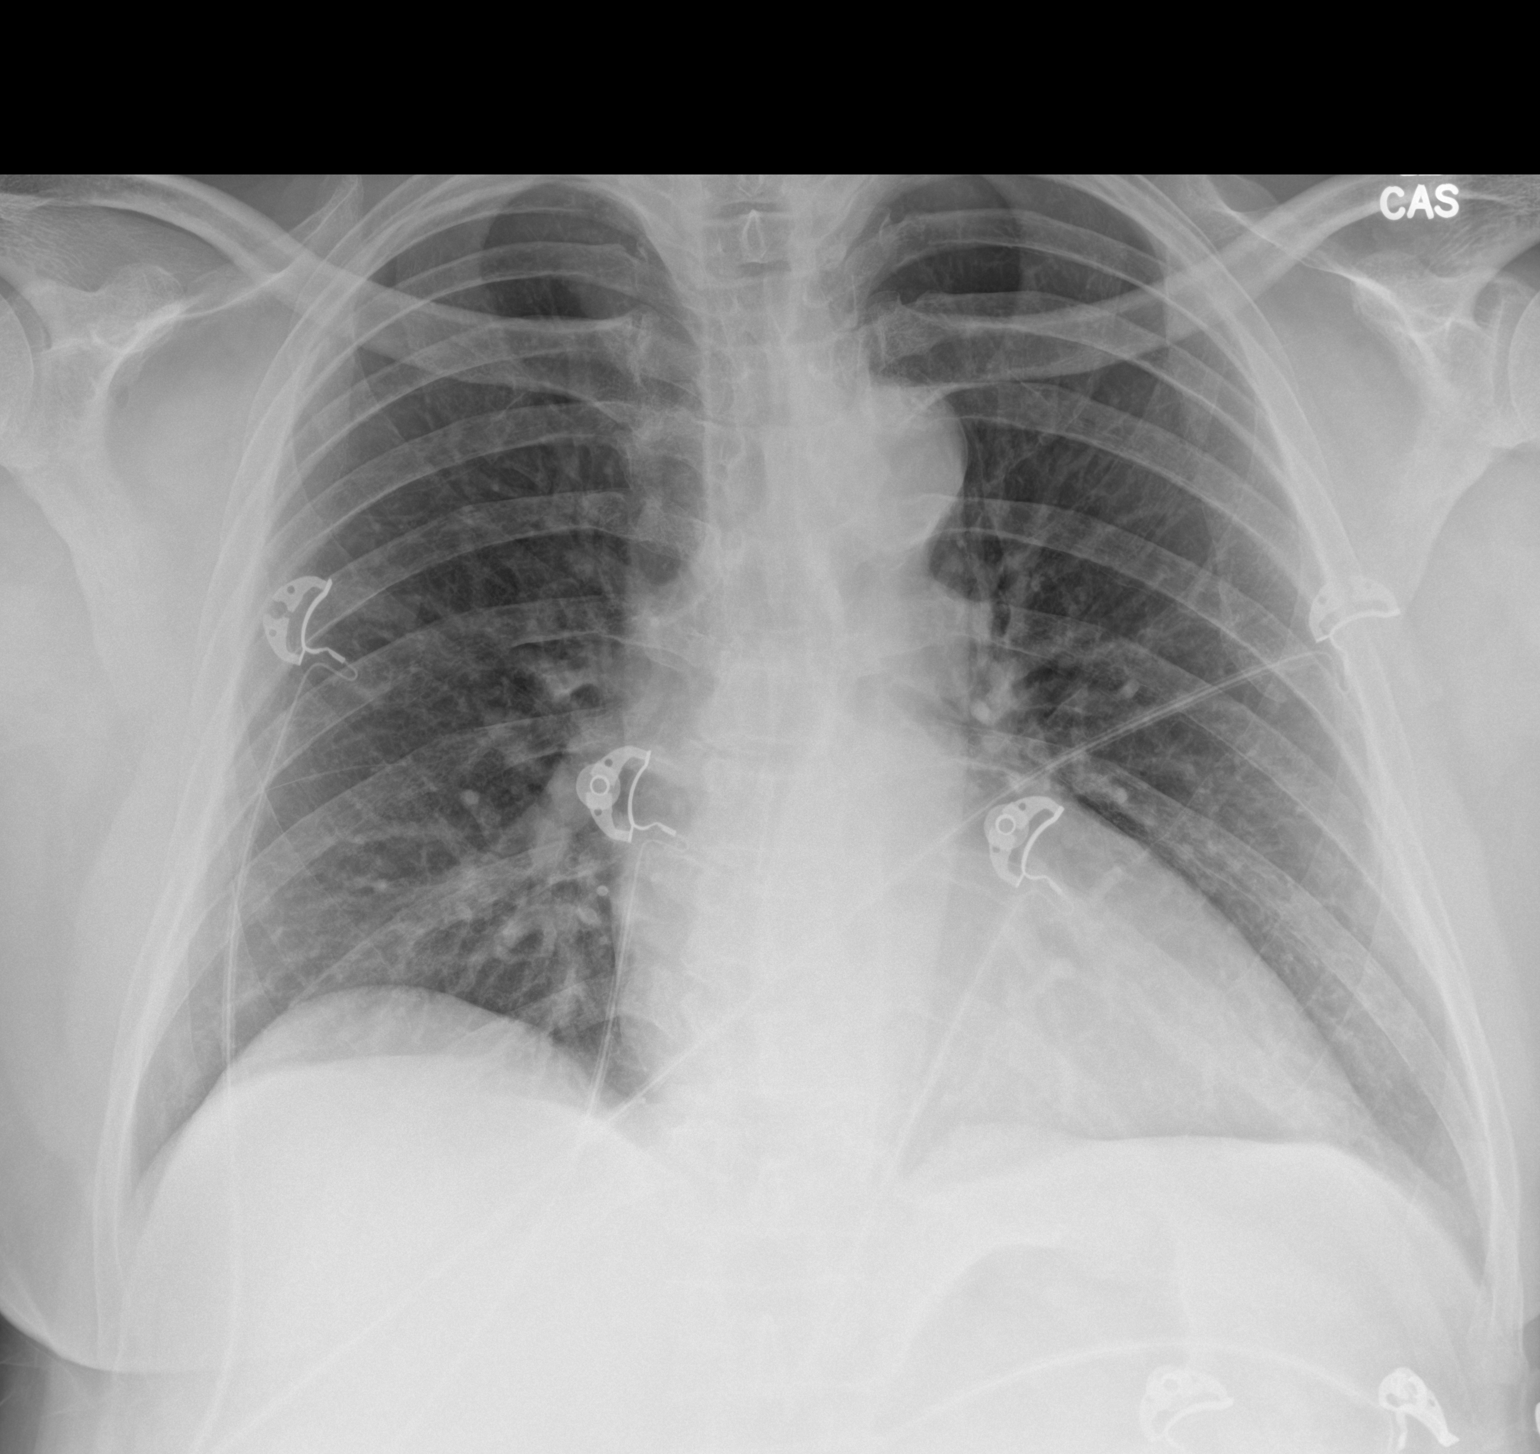

[1 of 1 positions shown; findings below may reference images not displayed]

FINDINGS: The heart size and mediastinal contours are within normal limits.
Both lungs are clear. No pneumothorax or pleural effusion is noted.
The visualized skeletal structures are unremarkable.
IMPRESSION: No active disease.

## 2022-04-15 NOTE — Telephone Encounter (Signed)
Reviewed results with patient.   Patient is agreeable to getting lab work but would prefer to do it the morning of his appt here with Dr. Saunders Revel on 03/06 so he doesn't have make so many trips.

## 2022-04-15 NOTE — Telephone Encounter (Signed)
-----   Message from [place], PA-C sent at 04/13/2022  4:15 PM EST ----- Stable kidney function. Just to make sure, patient needs BMET in 2 weeks as well

## 2022-04-16 ENCOUNTER — Ambulatory Visit: Payer: Medicare Other | Admitting: Internal Medicine

## 2022-05-08 ENCOUNTER — Ambulatory Visit: Payer: Medicare Other | Admitting: Internal Medicine

## 2022-05-13 ENCOUNTER — Ambulatory Visit: Payer: Medicare Other | Admitting: Internal Medicine

## 2022-05-25 ENCOUNTER — Ambulatory Visit: Payer: Medicare Other | Attending: Cardiology

## 2022-05-25 DIAGNOSIS — I5032 Chronic diastolic (congestive) heart failure: Secondary | ICD-10-CM | POA: Diagnosis not present

## 2022-05-25 DIAGNOSIS — R0601 Orthopnea: Secondary | ICD-10-CM | POA: Diagnosis not present

## 2022-05-25 LAB — ECHOCARDIOGRAM COMPLETE
AR max vel: 2.07 cm2
AV Area VTI: 2.07 cm2
AV Area mean vel: 2.1 cm2
AV Mean grad: 6 mmHg
AV Peak grad: 12.7 mmHg
Ao pk vel: 1.78 m/s
Area-P 1/2: 3.21 cm2
Calc EF: 66.8 %
S' Lateral: 3.2 cm
Single Plane A2C EF: 56.5 %
Single Plane A4C EF: 75.9 %

## 2022-06-17 ENCOUNTER — Encounter: Payer: Self-pay | Admitting: Internal Medicine

## 2022-06-17 ENCOUNTER — Ambulatory Visit: Payer: Medicare Other | Attending: Internal Medicine | Admitting: Internal Medicine

## 2022-06-17 VITALS — BP 100/60 | HR 57 | Ht 60.0 in | Wt 184.2 lb

## 2022-06-17 DIAGNOSIS — I4892 Unspecified atrial flutter: Secondary | ICD-10-CM | POA: Insufficient documentation

## 2022-06-17 DIAGNOSIS — I5032 Chronic diastolic (congestive) heart failure: Secondary | ICD-10-CM | POA: Diagnosis present

## 2022-06-17 DIAGNOSIS — I10 Essential (primary) hypertension: Secondary | ICD-10-CM | POA: Insufficient documentation

## 2022-06-17 MED ORDER — APIXABAN 5 MG PO TABS: 5.0000 mg | ORAL_TABLET | Freq: Two times a day (BID) | ORAL | 1 refills | Status: DC

## 2022-06-17 MED ORDER — AMLODIPINE BESYLATE 10 MG PO TABS: 10.0000 mg | ORAL_TABLET | Freq: Every day | ORAL | 3 refills | Status: DC

## 2022-06-17 MED ORDER — HYDROCHLOROTHIAZIDE 25 MG PO TABS: 25.0000 mg | ORAL_TABLET | Freq: Every day | ORAL | 3 refills | Status: DC

## 2022-06-17 MED ORDER — METOPROLOL TARTRATE 50 MG PO TABS: 50.0000 mg | ORAL_TABLET | Freq: Two times a day (BID) | ORAL | 3 refills | Status: DC

## 2022-06-17 MED ORDER — ENTRESTO 24-26 MG PO TABS: 1.0000 | ORAL_TABLET | Freq: Two times a day (BID) | ORAL | 3 refills | Status: DC

## 2022-06-17 NOTE — Progress Notes (Signed)
Follow-up Outpatient Visit Date: 06/17/2022  Primary Care Provider: Patient, No Pcp Per No address on file  Chief Complaint: Follow-up HFpEF and atrial fibrillation/flutter  HPI:  Jacob Waters is a 67 y.o. male with history of chronic HFpEF, paroxysmal atrial fibrillation/flutter, hypertension, and polysubstance abuse, who presents for follow-up of HFpEF and atrial flutter.  I last saw him in late November, at which time he was feeling fairly well other than occasional orthopnea accompanied by a 12 pound weight gain over the preceding 6 months.  BNP was noted to be normal at that time.  Echocardiogram ultimately performed last month showed normal LVEF.  Mild pulmonary hypertension was noted as well as severe left atrial enlargement.  He was hospitalized in January with acute abdominal pain and emesis, found to have small bowel obstruction in the setting of large bilateral inguinal hernias.  He was managed conservatively with plans for subsequent outpatient hernia repair.  Today, Jacob Waters reports that he has been feeling well.  He denies chest pain, shortness of breath, palpitations, lightheadedness, and edema.  He intermittently checks his blood pressure at home and notes that it is usually in the low normal range, similar to today's reading.  He is tolerating his medications well.  He denies bleeding.  He saw Dr. Tonna Boehringer for follow-up of his inguinal hernias; conservative management was recommended.  --------------------------------------------------------------------------------------------------  Past Medical History:  Diagnosis Date   Alcohol abuse    Atrial flutter    Chronic heart failure with preserved ejection fraction (HFpEF)    Hypertension    Past Surgical History:  Procedure Laterality Date   NO PAST SURGERIES      Current Meds  Medication Sig   amLODipine (NORVASC) 10 MG tablet Take 1 tablet (10 mg total) by mouth daily.   apixaban (ELIQUIS) 5 MG TABS tablet Take 1 tablet by  mouth twice daily   hydrochlorothiazide (HYDRODIURIL) 25 MG tablet Take 1 tablet (25 mg total) by mouth daily.   isosorbide mononitrate (IMDUR) 30 MG 24 hr tablet Take 1 tablet (30 mg total) by mouth daily.   metoprolol tartrate (LOPRESSOR) 50 MG tablet Take 1 tablet (50 mg total) by mouth 2 (two) times daily.   sacubitril-valsartan (ENTRESTO) 24-26 MG Take 1 tablet by mouth 2 (two) times daily.    Allergies: Patient has no known allergies.  Social History   Tobacco Use   Smoking status: Never   Smokeless tobacco: Current    Types: Chew    Last attempt to quit: 05/2018  Vaping Use   Vaping Use: Never used  Substance Use Topics   Alcohol use: Not Currently    Alcohol/week: 24.0 standard drinks of alcohol    Types: 24 Cans of beer per week   Drug use: Never    Family History  Problem Relation Age of Onset   Cancer Mother    Hypertension Mother    Cancer Father    Hypertension Father    Hypertension Sister     Review of Systems: A 12-system review of systems was performed and was negative except as noted in the HPI.  --------------------------------------------------------------------------------------------------  Physical Exam: BP 100/60 (BP Location: Left Arm, Patient Position: Sitting, Cuff Size: Normal)   Pulse (!) 57   Ht 5' (1.524 m)   Wt 184 lb 4 oz (83.6 kg)   SpO2 97%   BMI 35.98 kg/m   General:  NAD. Neck: No JVD or HJR. Lungs: Coarse breath sounds without wheezes or crackles. Heart: Bradycardic but regular  without murmurs, rubs, or gallops. Abdomen: Soft, nontender, nondistended. Extremities: No lower extremity edema.  Lab Results  Component Value Date   WBC 5.1 04/13/2022   HGB 13.3 04/13/2022   HCT 38.5 (L) 04/13/2022   MCV 86.9 04/13/2022   PLT 307 04/13/2022    Lab Results  Component Value Date   NA 140 04/13/2022   K 4.1 04/13/2022   CL 104 04/13/2022   CO2 28 04/13/2022   BUN 22 04/13/2022   CREATININE 1.55 (H) 04/13/2022    GLUCOSE 107 (H) 04/13/2022   ALT 19 03/11/2022    --------------------------------------------------------------------------------------------------  ASSESSMENT AND PLAN: Chronic HFpEF: Jacob Waters appears euvolemic.  Blood pressure very well-controlled.  No medication changes.  Paroxysmal atrial fibrillation/flutter: No symptoms to suggest recurrent atrial fibrillation/flutter.  Continue current regimen of metoprolol and apixaban.  Hypertension: Blood pressure low normal today.  We have agreed to discontinue isosorbide mononitrate.  Continue current doses of amlodipine, HCTZ, metoprolol, and Entresto.  Follow-up: Return to clinic in 3 months with APP.  Yvonne Kendall, MD 06/17/2022 9:14 AM

## 2022-06-17 NOTE — Patient Instructions (Signed)
Medication Instructions:  Your physician recommends the following medication changes.  STOP TAKING: Imdur 30 mg daily  *If you need a refill on your cardiac medications before your next appointment, please call your pharmacy*   Lab Work: None ordered today   Testing/Procedures: None ordered today   Follow-Up: At [facility], you and your health needs are our priority.  As part of our continuing mission to provide you with exceptional heart care, we have created designated Provider Care Teams.  These Care Teams include your primary Cardiologist (physician) and Advanced Practice Providers (APPs -  Physician Assistants and Nurse Practitioners) who all work together to provide you with the care you need, when you need it.  We recommend signing up for the patient portal called "MyChart".  Sign up information is provided on this After Visit Summary.  MyChart is used to connect with patients for Virtual Visits (Telemedicine).  Patients are able to view lab/test results, encounter notes, upcoming appointments, etc.  Non-urgent messages can be sent to your provider as well.   To learn more about what you can do with MyChart, go to [email or web address].    Your next appointment:   3 month(s)  Provider:   You will see one of the following Advanced Practice Providers on your designated Care Team:   Nicolasa Ducking, NP Eula Listen, PA-C Cadence Fransico Michael, PA-C Charlsie Quest, NP  Dr. Okey Dupre recommends checking and keeping a log of your blood pressure a few times throughout the week. Please call the office if you notice your blood pressure is consistently greater than 140/90.  It is best to check your blood pressure 1-2 hours after taking your medications.  Below are some tips that our clinical pharmacists share for home BP monitoring:          Rest 10 minutes before taking your blood pressure.          Don't smoke or drink caffeinated beverages for at least 30 minutes before.           Take your blood pressure before (not after) you eat.          Sit comfortably with your back supported and both feet on the floor (don't cross your legs).          Elevate your arm to heart level on a table or a desk.          Use the proper sized cuff. It should fit smoothly and snugly around your bare upper arm. There should be enough room to slip a fingertip under the cuff. The bottom edge of the cuff should be 1 inch above the crease of the elbow.

## 2022-07-08 ENCOUNTER — Telehealth: Payer: Self-pay | Admitting: Internal Medicine

## 2022-07-08 DIAGNOSIS — I4892 Unspecified atrial flutter: Secondary | ICD-10-CM

## 2022-07-08 MED ORDER — APIXABAN 5 MG PO TABS
5.0000 mg | ORAL_TABLET | Freq: Two times a day (BID) | ORAL | 1 refills | Status: DC
Start: 2022-07-08 — End: 2022-09-16

## 2022-07-08 NOTE — Telephone Encounter (Signed)
Prescription refill request for Eliquis received. Indication: Afib  Last office visit: 06/17/22 (End)  Scr: 1.55 (04/13/22)  Age: 67 Weight: 83.6kg  Appropriate dose. Refill sent.

## 2022-07-08 NOTE — Telephone Encounter (Signed)
*  STAT* If patient is at the pharmacy, call can be transferred to refill team.   1. Which medications need to be refilled? (please list name of each medication and dose if known)  apixaban (ELIQUIS) 5 MG TABS tablet  2. Which pharmacy/location (including street and city if local pharmacy) is medication to be sent to? [id number] -  (N), [place] - [address]    3. Do they need a 30 day or 90 day supply?  90 day supply  Patient states he has been out of medication since Monday.

## 2022-07-08 NOTE — Telephone Encounter (Signed)
Refill request

## 2022-09-16 ENCOUNTER — Ambulatory Visit: Payer: Medicare Other | Attending: Medical | Admitting: Medical

## 2022-09-16 ENCOUNTER — Encounter: Payer: Self-pay | Admitting: Medical

## 2022-09-16 VITALS — BP 140/72 | HR 62 | Ht 65.0 in | Wt 189.4 lb

## 2022-09-16 DIAGNOSIS — I4892 Unspecified atrial flutter: Secondary | ICD-10-CM | POA: Diagnosis present

## 2022-09-16 DIAGNOSIS — I5032 Chronic diastolic (congestive) heart failure: Secondary | ICD-10-CM | POA: Insufficient documentation

## 2022-09-16 DIAGNOSIS — I10 Essential (primary) hypertension: Secondary | ICD-10-CM | POA: Insufficient documentation

## 2022-09-16 DIAGNOSIS — Z720 Tobacco use: Secondary | ICD-10-CM | POA: Diagnosis present

## 2022-09-16 MED ORDER — AMLODIPINE BESYLATE 10 MG PO TABS: 10.0000 mg | ORAL_TABLET | Freq: Every day | ORAL | 3 refills | Status: DC

## 2022-09-16 MED ORDER — HYDROCHLOROTHIAZIDE 25 MG PO TABS: 25.0000 mg | ORAL_TABLET | Freq: Every day | ORAL | 3 refills | Status: DC

## 2022-09-16 MED ORDER — METOPROLOL TARTRATE 50 MG PO TABS: 50.0000 mg | ORAL_TABLET | Freq: Two times a day (BID) | ORAL | 3 refills | Status: DC

## 2022-09-16 MED ORDER — APIXABAN 5 MG PO TABS
5.0000 mg | ORAL_TABLET | Freq: Two times a day (BID) | ORAL | 1 refills | Status: DC
Start: 2022-09-16 — End: 2023-02-17

## 2022-09-16 MED ORDER — ENTRESTO 24-26 MG PO TABS: 1.0000 | ORAL_TABLET | Freq: Two times a day (BID) | ORAL | 3 refills | Status: DC

## 2022-09-16 NOTE — Patient Instructions (Signed)
Medication Instructions:  No changes *If you need a refill on your cardiac medications before your next appointment, please call your pharmacy*   Lab Work: None ordered If you have labs (blood work) drawn today and your tests are completely normal, you will receive your results only by: MyChart Message (if you have MyChart) OR A paper copy in the mail If you have any lab test that is abnormal or we need to change your treatment, we will call you to review the results.   Testing/Procedures: None ordered   Follow-Up: At  [facility], you and your health needs are our priority.  As part of our continuing mission to provide you with exceptional heart care, we have created designated Provider Care Teams.  These Care Teams include your primary Cardiologist (physician) and Advanced Practice Providers (APPs -  Physician Assistants and Nurse Practitioners) who all work together to provide you with the care you need, when you need it.  We recommend signing up for the patient portal called "MyChart".  Sign up information is provided on this After Visit Summary.  MyChart is used to connect with patients for Virtual Visits (Telemedicine).  Patients are able to view lab/test results, encounter notes, upcoming appointments, etc.  Non-urgent messages can be sent to your provider as well.   To learn more about what you can do with MyChart, go to [email or web address].    Your next appointment:   6 month(s)  Provider:   You may see Christopher End, MD or one of the following Advanced Practice Providers on your designated Care Team:   Christopher Berge, NP Ryan Dunn, PA-C Cadence Furth, PA-C Sheri Hammock, NP     

## 2022-09-16 NOTE — Progress Notes (Signed)
Cardiology Office Note:    Date:  09/16/2022   ID:  Jacob Waters, DOB 02-18-1956, MRN [id number]  PCP:  Patient, No Pcp Per  [facility] Cardiologist:  Yvonne Kendall, MD  [facility] Electrophysiologist:  None   Referring MD: No ref. provider found   Chief Complaint: 4 month follow-up  History of Present Illness:    Jacob Waters is a 67 y.o. male with a hx of paroxysmal aflutter, tobacco use, HFpEF, HTN who presents for afib follow-up.    Echo in 06/2020 showed LVEF 60-65%.    He was seen 03/27/22 for his scheduled echo and was found to be in rapid afib with RVR. Echo was postponed and he was seen in the office that day.He was given Lopressor in the office with improvement and started on Lopressor 50mg  MD. he was seen back in follow-up April 13, 2022 and was in sinus bradycardia.  Echocardiogram was rescheduled.  Patient was last seen in April 2024 was overall feeling well from a cardiac perspective.  Today, the patient reports he is overall doing well. He denies heart fluttering or racing. EKG shows NSR. He denies chest pain, shortness of breath, lower leg edema, orthopnea or pnd. He has been taking all his medications. He needs refills on medications.   Past Medical History:  Diagnosis Date   Alcohol abuse    Atrial flutter (HCC)    Chronic heart failure with preserved ejection fraction (HFpEF) (HCC)    Hypertension     Past Surgical History:  Procedure Laterality Date   NO PAST SURGERIES      Current Medications: Current Meds  Medication Sig   [DISCONTINUED] amLODipine (NORVASC) 10 MG tablet Take 1 tablet (10 mg total) by mouth daily.   [DISCONTINUED] apixaban (ELIQUIS) 5 MG TABS tablet Take 1 tablet (5 mg total) by mouth 2 (two) times daily.   [DISCONTINUED] hydrochlorothiazide (HYDRODIURIL) 25 MG tablet Take 1 tablet (25 mg total) by mouth daily.   [DISCONTINUED] metoprolol tartrate (LOPRESSOR) 50 MG tablet Take 1 tablet (50 mg total) by mouth 2 (two) times  daily.   [DISCONTINUED] sacubitril-valsartan (ENTRESTO) 24-26 MG Take 1 tablet by mouth 2 (two) times daily.     Allergies:   Patient has no known allergies.   Social History   Socioeconomic History   Marital status: Single    Spouse name: Not on file   Number of children: Not on file   Years of education: Not on file   Highest education level: Not on file  Occupational History   Not on file  Tobacco Use   Smoking status: Never   Smokeless tobacco: Current    Types: Chew    Last attempt to quit: 05/2018  Vaping Use   Vaping Use: Never used  Substance and Sexual Activity   Alcohol use: Not Currently    Alcohol/week: 24.0 standard drinks of alcohol    Types: 24 Cans of beer per week   Drug use: Never   Sexual activity: Not on file  Other Topics Concern   Not on file  Social History Narrative   Not on file   Social Determinants of Health   Financial Resource Strain: Not on file  Food Insecurity: Food Insecurity Present (03/11/2022)   Hunger Vital Sign    Worried About Running Out of Food in the Last Year: Sometimes true    Ran Out of Food in the Last Year: Never true  Transportation Needs: No Transportation Needs (03/11/2022)   PRAPARE - Transportation  Lack of Transportation (Medical): No    Lack of Transportation (Non-Medical): No  Physical Activity: Not on file  Stress: Not on file  Social Connections: Not on file     Family History: The patient's family history includes Cancer in his father and mother; Hypertension in his father, mother, and sister.  ROS:   Please see the history of present illness.     All other systems reviewed and are negative.  EKGs/Labs/Other Studies Reviewed:    The following studies were reviewed today:  Echo 05/2022 1. Left ventricular ejection fraction, by estimation, is 60 to 65%. Left  ventricular ejection fraction by 2D MOD biplane is 66.8 %. The left  ventricle has normal function. The left ventricle has no regional wall   motion abnormalities. Left ventricular  diastolic parameters are indeterminate.   2. Right ventricular systolic function is normal. The right ventricular  size is normal. There is mildly elevated pulmonary artery systolic  pressure. The estimated right ventricular systolic pressure is 41.6 mmHg.   3. Left atrial size was severely dilated.   4. The mitral valve is normal in structure. Trivial mitral valve  regurgitation.   5. The aortic valve is tricuspid. Aortic valve regurgitation is not  visualized. Aortic valve sclerosis is present, with no evidence of aortic  valve stenosis.   6. The inferior vena cava is dilated in size with >50% respiratory  variability, suggesting right atrial pressure of 8 mmHg.   Comparison(s): 06/18/20 Echo 60-65%.   EKG:  EKG is ordered today.  The ekg ordered today demonstrates NSR 62bpm, no ST/T wave changes  Recent Labs: 02/04/2022: B Natriuretic Peptide 29.2 03/11/2022: ALT 19; Magnesium 2.6 04/13/2022: BUN 22; Creatinine, Ser 1.55; Hemoglobin 13.3; Platelets 307; Potassium 4.1; Sodium 140  Recent Lipid Panel No results found for: "CHOL", "TRIG", "HDL", "CHOLHDL", "VLDL", "LDLCALC", "LDLDIRECT"   Physical Exam:    VS:  BP (!) 140/72 (BP Location: Left Arm, Patient Position: Sitting, Cuff Size: Large)   Pulse 62   Ht 5\' 5"  (1.651 m)   Wt 189 lb 6.4 oz (85.9 kg)   SpO2 97%   BMI 31.52 kg/m     Wt Readings from Last 3 Encounters:  09/16/22 189 lb 6.4 oz (85.9 kg)  06/17/22 184 lb 4 oz (83.6 kg)  04/13/22 179 lb 9.6 oz (81.5 kg)     GEN:  Well nourished, well developed in no acute distress HEENT: Normal NECK: No JVD; No carotid bruits LYMPHATICS: No lymphadenopathy CARDIAC: RRR, no murmurs, rubs, gallops RESPIRATORY:  +wheezing ABDOMEN: Soft, non-tender, non-distended MUSCULOSKELETAL:  No edema; No deformity  SKIN: Warm and dry NEUROLOGIC:  Alert and oriented x 3 PSYCHIATRIC:  Normal affect   ASSESSMENT:    1. Paroxysmal atrial flutter  (HCC)   2. Chronic diastolic heart failure (HCC)   3. Essential hypertension   4. Tobacco use    PLAN:    In order of problems listed above:  Paroxysmal Afib EKG today showed NSR. He reports compliance with Eliquis. He denies bleeding issues. Refill Eliquis today. Continue rate control with Lopressor 50mg  BID.  HFpEF The patient is euvolemic on exam. Echo 05/2022 showed LVEF 60-65%, no WMA, normal RVSF, trivial MR, severely dilated left atrium. Continue Lopressor, hydrochlorothiazide and Entresto.  HTN BP is mildly elevated. Continue amlodipine 10mg  daily, hydrochlorothiazide 25mg  daily, Lopressor 50mg  BID and Entresto 24-26 mg BID. I will send in refills.   Tobacco use Wheezing on exam, cessation advised  Disposition: Follow up in 6  month(s) with MD/APP    Signed, Niaomi Cartaya David Stall, PA-C  09/16/2022 10:41 AM    [facility]

## 2022-11-08 ENCOUNTER — Emergency Department
Admission: EM | Admit: 2022-11-08 | Discharge: 2022-11-08 | Disposition: A | Discharge status: Home / Self Care | Payer: Medicare Other | Source: Home / Self Care | Attending: Emergency Medicine | Admitting: Emergency Medicine

## 2022-11-08 ENCOUNTER — Other Ambulatory Visit: Payer: Self-pay

## 2022-11-08 DIAGNOSIS — R21 Rash and other nonspecific skin eruption: Secondary | ICD-10-CM | POA: Diagnosis present

## 2022-11-08 DIAGNOSIS — L245 Irritant contact dermatitis due to other chemical products: Secondary | ICD-10-CM | POA: Insufficient documentation

## 2022-11-08 DIAGNOSIS — L309 Dermatitis, unspecified: Secondary | ICD-10-CM

## 2022-11-08 MED ORDER — CLOBETASOL PROPIONATE 0.05 % EX OINT: 1.0000 | TOPICAL_OINTMENT | Freq: Two times a day (BID) | CUTANEOUS | 0 refills | Status: AC

## 2022-11-08 MED ORDER — PREDNISONE 20 MG PO TABS: ORAL_TABLET | ORAL | 0 refills | Status: AC

## 2022-11-08 MED ORDER — PREDNISONE 20 MG PO TABS
60.0000 mg | ORAL_TABLET | Freq: Once | ORAL | Status: AC
  Administered 2022-11-08: 60 mg via ORAL
  Filled 2022-11-08: qty 3

## 2022-11-08 NOTE — ED Provider Notes (Signed)
[facility] Provider Note    Event Date/Time   First MD Initiated Contact with Patient 11/08/22 [phone]     (approximate)   History   Rash   HPI  Jacob Waters is a 67 y.o. male who presents to the ED from home with a chief complaint of itchy rash.  Patient endorses rash on his hairline, ears and neck which he noticed yesterday.  Used dandruff shampoo for the first time.  Additionally dyed his hair.  Voices no additional complaints.  Specifically, denies facial/tongue swelling, chest pain, difficulty breathing, vomiting or diarrhea.     Past Medical History   Past Medical History:  Diagnosis Date   Alcohol abuse    Atrial flutter (HCC)    Chronic heart failure with preserved ejection fraction (HFpEF) (HCC)    Hypertension      Active Problem List   Patient Active Problem List   Diagnosis Date Noted   SBO (small bowel obstruction) (HCC) 03/11/2022   Aortic atherosclerosis (HCC) 03/11/2022   Coffee ground emesis 03/11/2022   Overweight 03/11/2022   Orthopnea 02/04/2022   Tobacco abuse 07/25/2021   Chronic heart failure with preserved ejection fraction (HFpEF) (HCC) 06/18/2021   Flutter-fibrillation (HCC) 11/12/2019   ETOH abuse    Alcohol withdrawal (HCC) 11/11/2019   Accelerated hypertension 11/11/2019   SVT (supraventricular tachycardia)    Hypomagnesemia    Stage 3a chronic kidney disease (HCC)    Nausea and vomiting    Essential hypertension 06/29/2018   Numbness    Polysubstance abuse (HCC)    Paroxysmal atrial flutter (HCC) 06/20/2018     Past Surgical History   Past Surgical History:  Procedure Laterality Date   NO PAST SURGERIES       Home Medications   Prior to Admission medications   Medication Sig Start Date End Date Taking? Authorizing Provider  clobetasol ointment (TEMOVATE) 0.05 % Apply 1 Application topically 2 (two) times daily. 11/08/22  Yes Irean Hong, MD  predniSONE (DELTASONE) 20 MG tablet 3 tablets daily x  4 days 11/08/22  Yes Irean Hong, MD  amLODipine (NORVASC) 10 MG tablet Take 1 tablet (10 mg total) by mouth daily. 09/16/22   Furth, Cadence H, PA-C  apixaban (ELIQUIS) 5 MG TABS tablet Take 1 tablet (5 mg total) by mouth 2 (two) times daily. 09/16/22   Furth, Cadence H, PA-C  hydrochlorothiazide (HYDRODIURIL) 25 MG tablet Take 1 tablet (25 mg total) by mouth daily. 09/16/22   Furth, Cadence H, PA-C  metoprolol tartrate (LOPRESSOR) 50 MG tablet Take 1 tablet (50 mg total) by mouth 2 (two) times daily. 09/16/22 09/16/23  Furth, Cadence H, PA-C  sacubitril-valsartan (ENTRESTO) 24-26 MG Take 1 tablet by mouth 2 (two) times daily. 09/16/22   Furth, Cadence H, PA-C     Allergies  Patient has no known allergies.   Family History   Family History  Problem Relation Age of Onset   Cancer Mother    Hypertension Mother    Cancer Father    Hypertension Father    Hypertension Sister      Physical Exam  Triage Vital Signs: ED Triage Vitals  Encounter Vitals Group     BP 11/08/22 0536 (!) 178/90     Systolic BP Percentile --      Diastolic BP Percentile --      Pulse Rate 11/08/22 0536 80     Resp 11/08/22 0536 17     Temp 11/08/22 0536 98.1 F (36.7  C)     Temp Source 11/08/22 0536 Oral     SpO2 11/08/22 0536 97 %     Weight 11/08/22 0535 185 lb (83.9 kg)     Height 11/08/22 0535 5\' 5"  (1.651 m)     Head Circumference --      Peak Flow --      Pain Score 11/08/22 0535 0     Pain Loc --      Pain Education --      Exclude from Growth Chart --     Updated Vital Signs: BP (!) 178/90   Pulse 80   Temp 98.1 F (36.7 C) (Oral)   Resp 17   Ht 5\' 5"  (1.651 m)   Wt 83.9 kg   SpO2 97%   BMI 30.79 kg/m    General: Awake, no distress.  CV:  Good peripheral perfusion.  Resp:  Normal effort.  Abd:  No distention.  Other:  Dry, flaky rash across hairline, bilateral ears and posterior neck.  Areas of excoriation noted.  No active infection.  Bilateral dorsal hands also with eczematous  rash.   ED Results / Procedures / Treatments  Labs (all labs ordered are listed, but only abnormal results are displayed) Labs Reviewed - No data to display   EKG  None   RADIOLOGY None   Official radiology report(s): No results found.   PROCEDURES:  Critical Care performed: No  Procedures   MEDICATIONS ORDERED IN ED: Medications  predniSONE (DELTASONE) tablet 60 mg (has no administration in time range)     IMPRESSION / MDM / ASSESSMENT AND PLAN / ED COURSE  I reviewed the triage vital signs and the nursing notes.                             67 year old male presenting with rash.  Likely patient has eczema at baseline and currently has contact dermatitis from irritants.  Will place on steroid burst (patient is not a diabetic), prescribed Clobetasol ointment to use as needed and refer patient to dermatology as needed for follow-up.  Strict return precautions given.  Patient verbalizes understanding and agrees with plan of care.  Patient's presentation is most consistent with acute, uncomplicated illness.   FINAL CLINICAL IMPRESSION(S) / ED DIAGNOSES   Final diagnoses:  Irritant contact dermatitis due to other chemical products  Eczema, unspecified type     Rx / DC Orders   ED Discharge Orders          Ordered    clobetasol ointment (TEMOVATE) 0.05 %  2 times daily        11/08/22 0622    predniSONE (DELTASONE) 20 MG tablet        11/08/22 1191             Note:  This document was prepared using Dragon voice recognition software and may include unintentional dictation errors.   Irean Hong, MD 11/08/22 [phone]

## 2022-11-08 NOTE — ED Triage Notes (Signed)
Pt to ED via POV c/o rash on ears, neck and scalp that he noticed yesterday. States it itches. Has scratched his scalp so much he made himself bleed.

## 2022-11-08 NOTE — Discharge Instructions (Signed)
1.  Finish steroids as prescribed.  Take your next dose Monday morning. 2.  You may apply Clobetasol ointment to affected areas twice daily as needed. 3.  Return to the ER for worsening symptoms, fever, redness/swelling, purulent discharge or other concerns.

## 2023-01-21 ENCOUNTER — Emergency Department: Payer: Medicare Other

## 2023-01-21 ENCOUNTER — Encounter: Payer: Self-pay | Admitting: Emergency Medicine

## 2023-01-21 ENCOUNTER — Other Ambulatory Visit: Payer: Self-pay

## 2023-01-21 DIAGNOSIS — I110 Hypertensive heart disease with heart failure: Secondary | ICD-10-CM | POA: Insufficient documentation

## 2023-01-21 DIAGNOSIS — I509 Heart failure, unspecified: Secondary | ICD-10-CM | POA: Diagnosis not present

## 2023-01-21 DIAGNOSIS — F1090 Alcohol use, unspecified, uncomplicated: Secondary | ICD-10-CM | POA: Diagnosis not present

## 2023-01-21 DIAGNOSIS — Z7901 Long term (current) use of anticoagulants: Secondary | ICD-10-CM | POA: Diagnosis not present

## 2023-01-21 DIAGNOSIS — R1013 Epigastric pain: Secondary | ICD-10-CM | POA: Diagnosis present

## 2023-01-21 DIAGNOSIS — K851 Biliary acute pancreatitis without necrosis or infection: Secondary | ICD-10-CM | POA: Insufficient documentation

## 2023-01-21 LAB — COMPREHENSIVE METABOLIC PANEL
ALT: 42 U/L (ref 0–44)
AST: 52 U/L — ABNORMAL HIGH (ref 15–41)
Albumin: 4.3 g/dL (ref 3.5–5.0)
Alkaline Phosphatase: 57 U/L (ref 38–126)
Anion gap: 13 (ref 5–15)
BUN: 20 mg/dL (ref 8–23)
CO2: 25 mmol/L (ref 22–32)
Calcium: 8.8 mg/dL — ABNORMAL LOW (ref 8.9–10.3)
Chloride: 95 mmol/L — ABNORMAL LOW (ref 98–111)
Creatinine, Ser: 1.57 mg/dL — ABNORMAL HIGH (ref 0.61–1.24)
GFR, Estimated: 48 mL/min — ABNORMAL LOW (ref >60)
Glucose, Bld: 151 mg/dL — ABNORMAL HIGH (ref 70–99)
Potassium: 3.2 mmol/L — ABNORMAL LOW (ref 3.5–5.1)
Sodium: 133 mmol/L — ABNORMAL LOW (ref 135–145)
Total Bilirubin: 1 mg/dL (ref <1.2)
Total Protein: 8.1 g/dL (ref 6.5–8.1)

## 2023-01-21 LAB — CBC
HCT: 36.9 % — ABNORMAL LOW (ref 39.0–52.0)
Hemoglobin: 12.9 g/dL — ABNORMAL LOW (ref 13.0–17.0)
MCH: 30.6 pg (ref 26.0–34.0)
MCHC: 35 g/dL (ref 30.0–36.0)
MCV: 87.6 fL (ref 80.0–100.0)
Platelets: 233 10*3/uL (ref 150–400)
RBC: 4.21 MIL/uL — ABNORMAL LOW (ref 4.22–5.81)
RDW: 12 % (ref 11.5–15.5)
WBC: 9.4 10*3/uL (ref 4.0–10.5)
nRBC: 0 % (ref 0.0–0.2)

## 2023-01-21 LAB — LIPASE, BLOOD: Lipase: 990 U/L — ABNORMAL HIGH (ref 11–51)

## 2023-01-21 NOTE — ED Triage Notes (Signed)
Patient ambulatory to triage with complaints of right sided abdominal pain that started 2 days ago. Denies previous abdominal surgery. Endorses normal bowel movements recently. Endorses drinking a 6 pack a day and has had some minor vomiting episodes as well.

## 2023-01-22 ENCOUNTER — Emergency Department
Admission: EM | Admit: 2023-01-22 | Discharge: 2023-01-22 | Disposition: A | Discharge status: Home / Self Care | Payer: Medicare Other | Attending: Emergency Medicine | Admitting: Emergency Medicine

## 2023-01-22 DIAGNOSIS — K851 Biliary acute pancreatitis without necrosis or infection: Secondary | ICD-10-CM

## 2023-01-22 DIAGNOSIS — Z789 Other specified health status: Secondary | ICD-10-CM

## 2023-01-22 MED ORDER — OXYCODONE HCL 5 MG PO TABS
5.0000 mg | ORAL_TABLET | Freq: Three times a day (TID) | ORAL | 0 refills | Status: AC | PRN
Start: 2023-01-22 — End: ?

## 2023-01-22 MED ORDER — OXYCODONE HCL 5 MG PO TABS
5.0000 mg | ORAL_TABLET | Freq: Once | ORAL | Status: AC
  Administered 2023-01-22: 5 mg via ORAL
  Filled 2023-01-22: qty 1

## 2023-01-22 MED ORDER — ONDANSETRON HCL 4 MG PO TABS: 4.0000 mg | ORAL_TABLET | Freq: Three times a day (TID) | ORAL | 0 refills | Status: AC | PRN

## 2023-01-22 MED ORDER — ONDANSETRON 4 MG PO TBDP
4.0000 mg | ORAL_TABLET | Freq: Once | ORAL | Status: AC
  Administered 2023-01-22: 4 mg via ORAL
  Filled 2023-01-22: qty 1

## 2023-01-22 NOTE — Discharge Instructions (Addendum)
Take acetaminophen 650 mg and ibuprofen 400 mg every 6 hours for pain.  Take with food. Take oxycodone as prescribed for severe pain only.  Take Zofran for nausea as needed.  Call Dr. Maurine Minister who is a general surgeon who can talk to you about treatment options including surgery for your gallstones.  Thank you for choosing [place] for your health care today!  Please see your primary doctor this week for a follow up appointment.   If you have any new, worsening, or unexpected symptoms call your doctor right away or come back to the emergency department for reevaluation.  It was my pleasure to care for you today.   Daneil Dan Modesto Charon, MD

## 2023-01-22 NOTE — ED Provider Notes (Signed)
[facility] Provider Note    Event Date/Time   First MD Initiated Contact with Patient 01/22/23 0250     (approximate)   History   Abdominal Pain   HPI  Jacob Waters is a 67 y.o. male   Past medical history of alcohol use, atrial fibrillation on Eliquis, hypertension, heart failure, who presents to the emergency department with epigastric and right upper quadrant pain especially worse after eating meals, last several days worsening.  No fevers no chills no chest pain no respiratory symptoms.  Some associated nausea.  No GU complaints.  Independent Historian contributed to assessment above: His partner is at bedside to corroborate information past medical history as above    Physical Exam   Triage Vital Signs: ED Triage Vitals [01/21/23 2041]  Encounter Vitals Group     BP (!) 174/104     Systolic BP Percentile      Diastolic BP Percentile      Pulse Rate 94     Resp 18     Temp 98.1 F (36.7 C)     Temp Source Oral     SpO2 94 %     Weight 185 lb (83.9 kg)     Height 5\' 5"  (1.651 m)     Head Circumference      Peak Flow      Pain Score 8     Pain Loc      Pain Education      Exclude from Growth Chart     Most recent vital signs: Vitals:   01/21/23 2041 01/22/23 0330  BP: (!) 174/104 (!) 171/98  Pulse: 94 89  Resp: 18 16  Temp: 98.1 F (36.7 C) 98.1 F (36.7 C)  SpO2: 94% 95%    General: Awake, no distress. CV:  Good peripheral perfusion.  Resp:  Normal effort.  Abd:  No distention.  Other:  Awake alert comfortable appearing with some mild epigastric and right upper quadrant tenderness to palpation.  Hypertensive otherwise vital signs normal.  Does not appear acutely intoxicated nor in acute withdrawal.   ED Results / Procedures / Treatments   Labs (all labs ordered are listed, but only abnormal results are displayed) Labs Reviewed  LIPASE, BLOOD - Abnormal; Notable for the following components:      Result Value    Lipase 990 (*)    All other components within normal limits  COMPREHENSIVE METABOLIC PANEL - Abnormal; Notable for the following components:   Sodium 133 (*)    Potassium 3.2 (*)    Chloride 95 (*)    Glucose, Bld 151 (*)    Creatinine, Ser 1.57 (*)    Calcium 8.8 (*)    AST 52 (*)    GFR, Estimated 48 (*)    All other components within normal limits  CBC - Abnormal; Notable for the following components:   RBC 4.21 (*)    Hemoglobin 12.9 (*)    HCT 36.9 (*)    All other components within normal limits  URINALYSIS, ROUTINE W REFLEX MICROSCOPIC     I ordered and reviewed the above labs they are notable for lipase in the 900s, creatinine is at baseline, white blood cell count is normal   RADIOLOGY I independently reviewed and interpreted ultrasound abdomen C gallstones but no evidence of cholecystitis I also reviewed radiologist's formal read.   PROCEDURES:  Critical Care performed: No  Procedures   MEDICATIONS ORDERED IN ED: Medications  ondansetron (ZOFRAN-ODT) disintegrating tablet  4 mg (4 mg Oral Given 01/22/23 0324)  oxyCODONE (Oxy IR/ROXICODONE) immediate release tablet 5 mg (5 mg Oral Given 01/22/23 0324)    IMPRESSION / MDM / ASSESSMENT AND PLAN / ED COURSE  I reviewed the triage vital signs and the nursing notes.                                Patient's presentation is most consistent with acute presentation with potential threat to life or bodily function.  Differential diagnosis includes, but is not limited to, biliary pathology like cholelithiasis/cholecystitis/cholangitis, pancreatitis, GERD/gastritis considered but less likely ACS   The patient is on the cardiac monitor to evaluate for evidence of arrhythmia and/or significant heart rate changes.  MDM:    Patient with epigastric and right upper quadrant postprandial pain with a lipase in the 900s as well as gallstones on ultrasound with no evidence of cholecystitis.  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  I offered admission given his symptoms and evidence of pancreatitis but patient declined stating that he feels well enough wants to eat a meal and go home.  I again offered admission for treatment, bowel rest, pain medications but he adamantly declines at this time.  He understands to return if any new or worsening symptoms.  I gave him contact information for general surgery for biliary colic gallstones         FINAL CLINICAL IMPRESSION(S) / ED DIAGNOSES   Final diagnoses:  Gallstone pancreatitis  Alcohol use     Rx / DC Orders   ED Discharge Orders          Ordered    ondansetron (ZOFRAN) 4 MG tablet  Every 8 hours PRN        01/22/23 0316    oxyCODONE (ROXICODONE) 5 MG immediate release tablet  Every 8 hours PRN        01/22/23 0316             Note:  This document was prepared using Dragon voice recognition software and may include unintentional dictation errors.    Pilar Jarvis, MD 01/22/23 [phone]

## 2023-02-06 ENCOUNTER — Other Ambulatory Visit: Payer: Self-pay | Admitting: Internal Medicine

## 2023-02-17 ENCOUNTER — Other Ambulatory Visit: Payer: Self-pay

## 2023-02-17 ENCOUNTER — Telehealth: Payer: Self-pay | Admitting: Internal Medicine

## 2023-02-17 DIAGNOSIS — I4892 Unspecified atrial flutter: Secondary | ICD-10-CM

## 2023-02-17 MED ORDER — APIXABAN 5 MG PO TABS
5.0000 mg | ORAL_TABLET | Freq: Two times a day (BID) | ORAL | 5 refills | Status: DC
Start: 2023-02-17 — End: 2023-11-09

## 2023-02-17 NOTE — Telephone Encounter (Signed)
Prescription refill request for Eliquis received. Indication:aflutter Last office visit:7/24 Scr:1.57  11/24 Age: 67 Weight:83.9  kg  Prescription refilled

## 2023-02-17 NOTE — Telephone Encounter (Signed)
*  STAT* If patient is at the pharmacy, call can be transferred to refill team.   1. Which medications need to be refilled? (please list name of each medication and dose if known)   apixaban (ELIQUIS) 5 MG TABS tablet   2. Would you like to learn more about the convenience, safety, & potential cost savings by using the [facility]?   3. Are you open to using the [facility] (Type [facility]. ).  4. Which pharmacy/location (including street and city if local pharmacy) is medication to be sent to?  [id number] - [place] ([place]), [place] - [address]   5. Do they need a 30 day or 90 day supply?   30 day  Patient stated he some medication left.

## 2023-02-17 NOTE — Telephone Encounter (Signed)
Refill Request.  

## 2023-03-17 NOTE — Progress Notes (Signed)
 Cardiology Office Note:    Date:  03/18/2023   ID:  Jacob Waters, DOB Apr 14, 1955, MRN [id number]  PCP:  Freddrick, No  [facility] Cardiologist:  Lonni Hanson, MD  [facility] Electrophysiologist:  None   Referring MD: No ref. provider found   Chief Complaint: 6 month follow-up  History of Present Illness:    Jacob Waters is a 68 y.o. male with a hx of  paroxysmal aflutter, tobacco use, HFpEF, HTN who presents for afib follow-up.    Echo in 06/2020 showed LVEF 60-65%.    He was seen 03/27/22 for his scheduled echo and was found to be in rapid afib with RVR. Echo was postponed and he was seen in the office that day.He was given Lopressor  in the office with improvement and started on Lopressor  50mg  MD. he was seen back in follow-up April 13, 2022 and was in sinus bradycardia.  Echocardiogram was rescheduled.   Patient was last seen in July 2024 and was overall doing well from a heart perspective.  Today, the patient is overall doing well from a cardiac perspective. He isout of Imdur  and metoprolol , which liekly explains high BP today. The patient denies chest pain and lower leg edema. He has occasional SOB. He rpeorts compliance with Eliquis . He is sometimes chew tobacco.   Past Medical History:  Diagnosis Date   Alcohol abuse    Atrial flutter (HCC)    Chronic heart failure with preserved ejection fraction (HFpEF) (HCC)    Hypertension     Past Surgical History:  Procedure Laterality Date   NO PAST SURGERIES      Current Medications: Current Meds  Medication Sig   amLODipine  (NORVASC ) 10 MG tablet Take 1 tablet (10 mg total) by mouth daily.   apixaban  (ELIQUIS ) 5 MG TABS tablet Take 1 tablet (5 mg total) by mouth 2 (two) times daily.   clobetasol  ointment (TEMOVATE ) 0.05 % Apply 1 Application topically 2 (two) times daily.   hydrochlorothiazide  (HYDRODIURIL ) 25 MG tablet Take 1 tablet (25 mg total) by mouth daily.   isosorbide  mononitrate (IMDUR ) 30 MG 24 hr tablet Take  1 tablet (30 mg total) by mouth daily.   sacubitril-valsartan (ENTRESTO ) 24-26 MG Take 1 tablet by mouth 2 (two) times daily.   [DISCONTINUED] metoprolol  tartrate (LOPRESSOR ) 50 MG tablet Take 1 tablet (50 mg total) by mouth 2 (two) times daily.     Allergies:   Patient has no known allergies.   Social History   Socioeconomic History   Marital status: Single    Spouse name: Not on file   Number of children: Not on file   Years of education: Not on file   Highest education level: Not on file  Occupational History   Not on file  Tobacco Use   Smoking status: Never   Smokeless tobacco: Current    Types: Chew    Last attempt to quit: 05/2018  Vaping Use   Vaping status: Never Used  Substance and Sexual Activity   Alcohol use: Not Currently    Alcohol/week: 24.0 standard drinks of alcohol    Types: 24 Cans of beer per week   Drug use: Never   Sexual activity: Not on file  Other Topics Concern   Not on file  Social History Narrative   Not on file   Social Drivers of Health   Financial Resource Strain: Not on file  Food Insecurity: Food Insecurity Present (03/11/2022)   Hunger Vital Sign    Worried About Running  Out of Food in the Last Year: Sometimes true    Ran Out of Food in the Last Year: Never true  Transportation Needs: No Transportation Needs (03/11/2022)   PRAPARE - Administrator, Civil Service (Medical): No    Lack of Transportation (Non-Medical): No  Physical Activity: Not on file  Stress: Not on file  Social Connections: Not on file     Family History: The patient's family history includes Cancer in his father and mother; Hypertension in his father, mother, and sister.  ROS:   Please see the history of present illness.     All other systems reviewed and are negative.  EKGs/Labs/Other Studies Reviewed:    The following studies were reviewed today:  Echo 05/2022  1. Left ventricular ejection fraction, by estimation, is 60 to 65%. Left   ventricular ejection fraction by 2D MOD biplane is 66.8 %. The left  ventricle has normal function. The left ventricle has no regional wall  motion abnormalities. Left ventricular  diastolic parameters are indeterminate.   2. Right ventricular systolic function is normal. The right ventricular  size is normal. There is mildly elevated pulmonary artery systolic  pressure. The estimated right ventricular systolic pressure is 41.6 mmHg.   3. Left atrial size was severely dilated.   4. The mitral valve is normal in structure. Trivial mitral valve  regurgitation.   5. The aortic valve is tricuspid. Aortic valve regurgitation is not  visualized. Aortic valve sclerosis is present, with no evidence of aortic  valve stenosis.   6. The inferior vena cava is dilated in size with >50% respiratory  variability, suggesting right atrial pressure of 8 mmHg.   Comparison(s): 06/18/20 Echo 60-65%.   Echo 06/2020 1. Left ventricular ejection fraction, by estimation, is 60 to 65%. The  left ventricle has normal function. The left ventricle has no regional  wall motion abnormalities. There is mild left ventricular hypertrophy.  Left ventricular diastolic parameters  were normal.   2. Right ventricular systolic function is normal. The right ventricular  size is normal. There is normal pulmonary artery systolic pressure.   3. Left atrial size was mildly dilated.   4. The mitral valve is normal in structure. No evidence of mitral valve  regurgitation.   5. The aortic valve is tricuspid. Aortic valve regurgitation is not  visualized. Mild aortic valve sclerosis is present, with no evidence of  aortic valve stenosis.   6. The inferior vena cava is normal in size with greater than 50%  respiratory variability, suggesting right atrial pressure of 3 mmHg.   EKG:  EKG is ordered today.  The ekg ordered today demonstrates NSR 96bpm, TWI III  Recent Labs: 01/21/2023: ALT 42; BUN 20; Creatinine, Ser 1.57;  Hemoglobin 12.9; Platelets 233; Potassium 3.2; Sodium 133  Recent Lipid Panel No results found for: CHOL, TRIG, HDL, CHOLHDL, VLDL, LDLCALC, LDLDIRECT  Physical Exam:    VS:  BP (!) 150/82   Pulse 96   Ht 5' 5 (1.651 m)   Wt 183 lb 12.8 oz (83.4 kg)   SpO2 98%   BMI 30.59 kg/m     Wt Readings from Last 3 Encounters:  03/18/23 183 lb 12.8 oz (83.4 kg)  01/21/23 185 lb (83.9 kg)  11/08/22 185 lb (83.9 kg)     GEN:  Well nourished, well developed in no acute distress HEENT: Normal NECK: No JVD; No carotid bruits LYMPHATICS: No lymphadenopathy CARDIAC: RRR, no murmurs, rubs, gallops RESPIRATORY:  Clear to  auscultation without rales, wheezing or rhonchi  ABDOMEN: Soft, non-tender, non-distended MUSCULOSKELETAL:  No edema; No deformity  SKIN: Warm and dry NEUROLOGIC:  Alert and oriented x 3 PSYCHIATRIC:  Normal affect   ASSESSMENT:    1. Paroxysmal atrial flutter (HCC)   2. Chronic diastolic heart failure (HCC)   3. Medication management   4. Essential hypertension   5. Tobacco use   6. Hypokalemia    PLAN:    In order of problems listed above:  Paroxysmal Afib EKG today showed NSR with rates in the 90s. Refill metoprolol  today. Continue Eliquis  5mg  BID for stroke ppx.  HFpEF The patient is euvolemic on exam today. Continue hydrochlorothiazide , metoprolol , and Entresto .  HTN BP is elevated. He ran out of metoprolol  and Imdur  3 months ago. I will refill metoprolol  50mg  BID and Imdur  30mg  daily. Continue amlodipine  10mg  daily, hydrochlorothiazide  25mg  daily, Entresto  24/26 mg BID.  Tobacco use He chews tobacco, cessation advised.   Hypokalemia K 3.2 on most recent labs. Re-check BMET.  Disposition: Follow up in 6 month(s) with MD/APP    Signed, Amey Hossain VEAR Fishman, PA-C  03/18/2023 9:40 AM    [facility]

## 2023-03-18 ENCOUNTER — Encounter: Payer: Self-pay | Admitting: Medical

## 2023-03-18 ENCOUNTER — Ambulatory Visit: Payer: Medicare Other | Attending: Medical | Admitting: Medical

## 2023-03-18 VITALS — BP 150/82 | HR 96 | Ht 65.0 in | Wt 183.8 lb

## 2023-03-18 DIAGNOSIS — I10 Essential (primary) hypertension: Secondary | ICD-10-CM | POA: Diagnosis present

## 2023-03-18 DIAGNOSIS — Z79899 Other long term (current) drug therapy: Secondary | ICD-10-CM

## 2023-03-18 DIAGNOSIS — I4892 Unspecified atrial flutter: Secondary | ICD-10-CM

## 2023-03-18 DIAGNOSIS — Z720 Tobacco use: Secondary | ICD-10-CM

## 2023-03-18 DIAGNOSIS — E876 Hypokalemia: Secondary | ICD-10-CM | POA: Diagnosis present

## 2023-03-18 DIAGNOSIS — I5032 Chronic diastolic (congestive) heart failure: Secondary | ICD-10-CM

## 2023-03-18 MED ORDER — METOPROLOL TARTRATE 50 MG PO TABS: 50.0000 mg | ORAL_TABLET | Freq: Two times a day (BID) | ORAL | 3 refills | Status: DC

## 2023-03-18 MED ORDER — ISOSORBIDE MONONITRATE ER 30 MG PO TB24: 30.0000 mg | ORAL_TABLET | Freq: Every day | ORAL | 3 refills | Status: DC

## 2023-03-18 NOTE — Patient Instructions (Signed)
 Medication Instructions:  Your Physician recommend you continue on your current medication as directed.    *If you need a refill on your cardiac medications before your next appointment, please call your pharmacy*   Lab Work: Your provider would like for you to have following labs drawn today BMeT.   If you have labs (blood work) drawn today and your tests are completely normal, you will receive your results only by: MyChart Message (if you have MyChart) OR A paper copy in the mail If you have any lab test that is abnormal or we need to change your treatment, we will call you to review the results.   Testing/Procedures: None ordered at this time    Follow-Up: At [facility], you and your health needs are our priority.  As part of our continuing mission to provide you with exceptional heart care, we have created designated Provider Care Teams.  These Care Teams include your primary Cardiologist (physician) and Advanced Practice Providers (APPs -  Physician Assistants and Nurse Practitioners) who all work together to provide you with the care you need, when you need it.  We recommend signing up for the patient portal called MyChart.  Sign up information is provided on this After Visit Summary.  MyChart is used to connect with patients for Virtual Visits (Telemedicine).  Patients are able to view lab/test results, encounter notes, upcoming appointments, etc.  Non-urgent messages can be sent to your provider as well.   To learn more about what you can do with MyChart, go to [email or web address].    Your next appointment:   6 month(s)  Provider:   You may see Lonni Hanson, MD or one of the following Advanced Practice Providers on your designated Care Team:   Lonni Meager, NP Bernardino Bring, PA-C Cadence Franchester, PA-C Tylene Lunch, NP Barnie Hila, NP

## 2023-03-19 LAB — BASIC METABOLIC PANEL
BUN/Creatinine Ratio: 8 — ABNORMAL LOW (ref 10–24)
BUN: 11 mg/dL (ref 8–27)
CO2: 26 mmol/L (ref 20–29)
Calcium: 9.8 mg/dL (ref 8.6–10.2)
Chloride: 100 mmol/L (ref 96–106)
Creatinine, Ser: 1.31 mg/dL — ABNORMAL HIGH (ref 0.76–1.27)
Glucose: 113 mg/dL — ABNORMAL HIGH (ref 70–99)
Potassium: 3.6 mmol/L (ref 3.5–5.2)
Sodium: 144 mmol/L (ref 134–144)
eGFR: 59 mL/min/{1.73_m2} — ABNORMAL LOW (ref >59)

## 2023-09-06 ENCOUNTER — Other Ambulatory Visit: Payer: Self-pay | Admitting: Medical

## 2023-09-06 NOTE — Telephone Encounter (Signed)
 Please contact pt for future appointment. Pt due for 6 month f/u.

## 2023-09-14 NOTE — Telephone Encounter (Signed)
 Pt is scheduled on 09/20/23

## 2023-09-20 ENCOUNTER — Ambulatory Visit: Payer: Medicare (Managed Care) | Attending: Medical | Admitting: Medical

## 2023-09-20 ENCOUNTER — Encounter: Payer: Self-pay | Admitting: Medical

## 2023-09-20 VITALS — BP 170/80 | HR 64 | Ht 65.0 in | Wt 181.6 lb

## 2023-09-20 DIAGNOSIS — I5032 Chronic diastolic (congestive) heart failure: Secondary | ICD-10-CM

## 2023-09-20 DIAGNOSIS — I480 Paroxysmal atrial fibrillation: Secondary | ICD-10-CM

## 2023-09-20 DIAGNOSIS — I10 Essential (primary) hypertension: Secondary | ICD-10-CM | POA: Diagnosis not present

## 2023-09-20 DIAGNOSIS — Z720 Tobacco use: Secondary | ICD-10-CM | POA: Diagnosis not present

## 2023-09-20 MED ORDER — ISOSORBIDE MONONITRATE ER 30 MG PO TB24: 30.0000 mg | ORAL_TABLET | Freq: Every day | ORAL | 3 refills | Status: AC

## 2023-09-20 MED ORDER — METOPROLOL TARTRATE 50 MG PO TABS: 50.0000 mg | ORAL_TABLET | Freq: Two times a day (BID) | ORAL | 3 refills | Status: AC

## 2023-09-20 MED ORDER — AMLODIPINE BESYLATE 10 MG PO TABS: 10.0000 mg | ORAL_TABLET | Freq: Every day | ORAL | 3 refills | Status: AC

## 2023-09-20 NOTE — Patient Instructions (Signed)

## 2023-09-20 NOTE — Progress Notes (Signed)
 Cardiology Office Note   Date:  09/20/2023  ID:  Jacob Waters, DOB 1955-12-14, MRN [id number] PCP: Freddrick, No  [facility] Providers Cardiologist:  Lonni Hanson, MD  History of Present Illness Jacob Waters is a 68 y.o. male with a hx of  paroxysmal aflutter, tobacco use, HFpEF, HTN who presents for afib follow-up.    Echo in 06/2020 showed LVEF 60-65%.    He was seen 03/27/22 for his scheduled echo and was found to be in rapid afib with RVR. Echo was postponed and he was seen in the office that day.He was given Lopressor  in the office with improvement and started on Lopressor  50mg  MD. he was seen back in follow-up April 13, 2022 and was in sinus bradycardia.  Echocardiogram was rescheduled.  The patient was last seen 03/18/2023 and was overall doing well from a cardiac perspective.  Blood pressure was high, but he had run out of 2 medications.  Today, the patient is overall doing well. BP is high, but he says he ran out of amlodipine , Imdur  and metoprolol  1 week ago. He says diet is salty and not healthy at times. He stays active with mowing the yard and walking. 170/80. Heis still chewing tobacco.    Studies Reviewed EKG Interpretation Date/Time:  Monday September 20 2023 08:47:48 EDT Ventricular Rate:  64 PR Interval:  150 QRS Duration:  82 QT Interval:  382 QTC Calculation: 394 R Axis:   3  Text Interpretation: Normal sinus rhythm Nonspecific T wave abnormality When compared with ECG of 18-Mar-2023 09:04, Vent. rate has decreased BY  32 BPM Nonspecific T wave abnormality now evident in Anterior leads Confirmed by Franchester, Verlyn Dannenberg ([id number]) on 09/20/2023 8:50:29 AM    Echo 05/2022  1. Left ventricular ejection fraction, by estimation, is 60 to 65%. Left  ventricular ejection fraction by 2D MOD biplane is 66.8 %. The left  ventricle has normal function. The left ventricle has no regional wall  motion abnormalities. Left ventricular  diastolic parameters are indeterminate.   2.  Right ventricular systolic function is normal. The right ventricular  size is normal. There is mildly elevated pulmonary artery systolic  pressure. The estimated right ventricular systolic pressure is 41.6 mmHg.   3. Left atrial size was severely dilated.   4. The mitral valve is normal in structure. Trivial mitral valve  regurgitation.   5. The aortic valve is tricuspid. Aortic valve regurgitation is not  visualized. Aortic valve sclerosis is present, with no evidence of aortic  valve stenosis.   6. The inferior vena cava is dilated in size with >50% respiratory  variability, suggesting right atrial pressure of 8 mmHg.   Comparison(s): 06/18/20 Echo 60-65%.    Echo 06/2020 1. Left ventricular ejection fraction, by estimation, is 60 to 65%. The  left ventricle has normal function. The left ventricle has no regional  wall motion abnormalities. There is mild left ventricular hypertrophy.  Left ventricular diastolic parameters  were normal.   2. Right ventricular systolic function is normal. The right ventricular  size is normal. There is normal pulmonary artery systolic pressure.   3. Left atrial size was mildly dilated.   4. The mitral valve is normal in structure. No evidence of mitral valve  regurgitation.   5. The aortic valve is tricuspid. Aortic valve regurgitation is not  visualized. Mild aortic valve sclerosis is present, with no evidence of  aortic valve stenosis.   6. The inferior vena cava is normal in size with greater than  50%  respiratory variability, suggesting right atrial pressure of 3 mmHg.         Physical Exam VS:  BP (!) 170/80   Pulse 64   Ht 5' 5 (1.651 m)   Wt 181 lb 9.6 oz (82.4 kg)   SpO2 98%   BMI 30.22 kg/m        Wt Readings from Last 3 Encounters:  09/20/23 181 lb 9.6 oz (82.4 kg)  03/18/23 183 lb 12.8 oz (83.4 kg)  01/21/23 185 lb (83.9 kg)    GEN: Well nourished, well developed in no acute distress NECK: No JVD; No carotid bruits CARDIAC:  RRR, no murmurs, rubs, gallops RESPIRATORY:  Clear to auscultation without rales, wheezing or rhonchi  ABDOMEN: Soft, non-tender, non-distended EXTREMITIES:  No edema; No deformity   ASSESSMENT AND PLAN  HTN BP is again high today. He says he ran out of amlodipine , Imdur , and metoprolol  1 week ago. He has only been taking Entresto  once daily. He should have refills at the pharmacy and this was explained to the patient. Diet and lifestyle was also discussed. We will send in refills for today, I asked him to bring in his medications next visit. Continue amlodipine  10mg  daily, Imdur  30mg  daily, hydrochlorothiazide  25mg  daily, metoprolol  50mg  BID, Entresto  24-26mg BID.   Paroxysmal Afib The patient is in NSR. Continue Eliquis  5mg  BID for stroke ppx. Refill metoprolol  as above (although likely has refills at the pharamcy).   HFpEF The patient is euvolemic on eam. Continue metoprolol , hydrochlorothiazide  and Entresto .  Tobacco use He still chews tobacco.        Dispo: follow-up in 6 months  Signed, Noreta Kue VEAR Fishman, PA-C

## 2023-10-29 ENCOUNTER — Other Ambulatory Visit: Payer: Self-pay | Admitting: Medical

## 2023-11-07 ENCOUNTER — Other Ambulatory Visit: Payer: Self-pay | Admitting: Medical

## 2023-11-07 DIAGNOSIS — I4892 Unspecified atrial flutter: Secondary | ICD-10-CM

## 2023-11-09 NOTE — Telephone Encounter (Signed)
 Prescription refill request for Eliquis  received. Indication:aflutter Last office visit:7/25 Scr:1.31  1/25 Age: 68 Weight:82.4  kg  Prescription refilled

## 2023-12-01 ENCOUNTER — Telehealth: Payer: Self-pay | Admitting: Internal Medicine

## 2023-12-01 NOTE — Telephone Encounter (Signed)
 [facility] calling to follow up about a Home Health Certification and Plan of Care originally faxed on 10/02/23. Requesting a callback. Please advise.

## 2023-12-01 NOTE — Telephone Encounter (Signed)
 Spoke with Apolinar from [facility], who stated that home health orders were sent in July for Dr. Mady to sign. I informed Apolinar that our office never received the orders. However, she was advised to contact the PCP to review and complete them. Apolinar verbalized understanding.

## 2024-03-30 ENCOUNTER — Other Ambulatory Visit: Payer: Self-pay | Admitting: Medical

## 2024-04-03 ENCOUNTER — Ambulatory Visit: Payer: Medicare (Managed Care) | Admitting: Medical

## 2024-04-11 ENCOUNTER — Ambulatory Visit: Payer: Medicare (Managed Care) | Admitting: Medical

## 2024-04-11 ENCOUNTER — Encounter: Payer: Self-pay | Admitting: Medical

## 2024-04-11 VITALS — BP 140/64 | HR 56 | Ht 65.0 in | Wt 189.2 lb

## 2024-04-11 DIAGNOSIS — Z720 Tobacco use: Secondary | ICD-10-CM

## 2024-04-11 DIAGNOSIS — I5032 Chronic diastolic (congestive) heart failure: Secondary | ICD-10-CM | POA: Diagnosis not present

## 2024-04-11 DIAGNOSIS — I4892 Unspecified atrial flutter: Secondary | ICD-10-CM

## 2024-04-11 DIAGNOSIS — I10 Essential (primary) hypertension: Secondary | ICD-10-CM | POA: Diagnosis not present

## 2024-04-11 DIAGNOSIS — R011 Cardiac murmur, unspecified: Secondary | ICD-10-CM | POA: Diagnosis not present

## 2024-04-11 DIAGNOSIS — Z79899 Other long term (current) drug therapy: Secondary | ICD-10-CM

## 2024-04-12 LAB — BASIC METABOLIC PANEL WITH GFR
BUN/Creatinine Ratio: 17 (ref 10–24)
BUN: 25 mg/dL (ref 8–27)
CO2: 20 mmol/L (ref 20–29)
Calcium: 9.7 mg/dL (ref 8.6–10.2)
Chloride: 103 mmol/L (ref 96–106)
Creatinine, Ser: 1.51 mg/dL — ABNORMAL HIGH (ref 0.76–1.27)
Glucose: 94 mg/dL (ref 70–99)
Potassium: 4.7 mmol/L (ref 3.5–5.2)
Sodium: 140 mmol/L (ref 134–144)
eGFR: 50 mL/min/{1.73_m2} — ABNORMAL LOW

## 2024-04-12 LAB — MAGNESIUM: Magnesium: 1.8 mg/dL (ref 1.6–2.3)

## 2024-04-12 LAB — CBC
Hematocrit: 37.8 % (ref 37.5–51.0)
Hemoglobin: 12.8 g/dL — ABNORMAL LOW (ref 13.0–17.7)
MCH: 30.1 pg (ref 26.6–33.0)
MCHC: 33.9 g/dL (ref 31.5–35.7)
MCV: 89 fL (ref 79–97)
Platelets: 408 10*3/uL (ref 150–450)
RBC: 4.25 x10E6/uL (ref 4.14–5.80)
RDW: 13.2 % (ref 11.6–15.4)
WBC: 6.2 10*3/uL (ref 3.4–10.8)

## 2024-04-13 ENCOUNTER — Ambulatory Visit: Payer: Self-pay | Admitting: Medical

## 2024-04-25 ENCOUNTER — Ambulatory Visit: Payer: Medicare (Managed Care)
